# Patient Record
Sex: Male | Born: 1985 | Hispanic: Yes | Marital: Single | Smoking: Former smoker
Health system: Southern US, Community
[De-identification: ages and names within clinical notes are randomized; demographics above are authoritative.]

## PROBLEM LIST (undated history)

## (undated) DIAGNOSIS — M419 Scoliosis, unspecified: Secondary | ICD-10-CM

## (undated) DIAGNOSIS — M109 Gout, unspecified: Secondary | ICD-10-CM

## (undated) DIAGNOSIS — E669 Obesity, unspecified: Secondary | ICD-10-CM

## (undated) HISTORY — PX: NO PAST SURGERIES: SHX2092

---

## 2018-05-21 ENCOUNTER — Ambulatory Visit
Admission: EM | Admit: 2018-05-21 | Discharge: 2018-05-21 | Disposition: A | Discharge status: Home / Self Care | Payer: BLUE CROSS/BLUE SHIELD | Attending: Family Medicine | Admitting: Family Medicine

## 2018-05-21 ENCOUNTER — Other Ambulatory Visit: Payer: Self-pay

## 2018-05-21 ENCOUNTER — Ambulatory Visit (INDEPENDENT_AMBULATORY_CARE_PROVIDER_SITE_OTHER): Payer: BLUE CROSS/BLUE SHIELD

## 2018-05-21 DIAGNOSIS — M779 Enthesopathy, unspecified: Secondary | ICD-10-CM

## 2018-05-21 DIAGNOSIS — M7750 Other enthesopathy of unspecified foot: Secondary | ICD-10-CM

## 2018-05-21 NOTE — Discharge Instructions (Signed)
Rest, ice, over the counter anti-inflammatories °

## 2018-05-21 NOTE — ED Provider Notes (Signed)
[facility]    CSN: [id number] Arrival date & time: 05/21/18  1445     History   Chief Complaint Chief Complaint  Patient presents with  . Foot Pain    right    HPI Mason Russell is a 33 y.o. male.   33 yo male with a c/o right foot pain for the past 2 months but worse this week. Denies any specific injury, redness, swelling, rash.   The history is provided by the patient.  Foot Pain     History reviewed. No pertinent past medical history.  There are no active problems to display for this patient.   Past Surgical History:  Procedure Laterality Date  . NO PAST SURGERIES         Home Medications    Prior to Admission medications   Not on File    Family History Family History  Problem Relation Age of Onset  . Cancer Mother     Social History Social History   Tobacco Use  . Smoking status: Never Smoker  . Smokeless tobacco: Never Used  Substance Use Topics  . Alcohol use: Yes    Frequency: Never    Comment: occasionally  . Drug use: Not Currently     Allergies   Patient has no known allergies.   Review of Systems Review of Systems   Physical Exam Triage Vital Signs ED Triage Vitals  Enc Vitals Group     BP 05/21/18 1504 (!) 146/88     Pulse Rate 05/21/18 1504 100     Resp 05/21/18 1504 16     Temp 05/21/18 1504 98.8 F (37.1 C)     Temp Source 05/21/18 1504 Oral     SpO2 05/21/18 1504 100 %     Weight 05/21/18 1502 168 lb (76.2 kg)     Height 05/21/18 1502 5\' 5"  (1.651 m)     Head Circumference --      Peak Flow --      Pain Score 05/21/18 1502 7     Pain Loc --      Pain Edu? --      Excl. in GC? --    No data found.  Updated Vital Signs BP (!) 146/88 (BP Location: Left Arm)   Pulse 100   Temp 98.8 F (37.1 C) (Oral)   Resp 16   Ht 5\' 5"  (1.651 m)   Wt 76.2 kg   SpO2 100%   BMI 27.96 kg/m   Visual Acuity Right Eye Distance:   Left Eye Distance:   Bilateral Distance:    Right Eye Near:   Left  Eye Near:    Bilateral Near:     Physical Exam Vitals signs and nursing note reviewed.  Constitutional:      General: He is not in acute distress.    Appearance: He is not toxic-appearing or diaphoretic.  Musculoskeletal:     Right foot: Normal range of motion and normal capillary refill. Tenderness (diffuse dorsal mid foot tenderness; no erythema, rash or swellilng) and bony tenderness present. No swelling, crepitus, deformity or laceration.  Neurological:     Mental Status: He is alert.      UC Treatments / Results  Labs (all labs ordered are listed, but only abnormal results are displayed) Labs Reviewed - No data to display  EKG None  Radiology Dg Foot Complete Right  Result Date: 05/21/2018 CLINICAL DATA:  Right foot pain for several months. No known injury. EXAM: RIGHT FOOT  COMPLETE - 3+ VIEW COMPARISON:  None. FINDINGS: There is no evidence of fracture or dislocation. There is no evidence of arthropathy or other focal bone abnormality. Soft tissues are unremarkable. IMPRESSION: Negative. Electronically Signed   By: Lupita RaiderJames  Green Jr M.D.   On: 05/21/2018 15:45    Procedures Procedures (including critical care time)  Medications Ordered in UC Medications - No data to display  Initial Impression / Assessment and Plan / UC Course  I have reviewed the triage vital signs and the nursing notes.  Pertinent labs & imaging results that were available during my care of the patient were reviewed by me and considered in my medical decision making (see chart for details).      Final Clinical Impressions(s) / UC Diagnoses   Final diagnoses:  Tendonitis of foot     Discharge Instructions     Rest, ice, over the counter anti-inflammatories    ED Prescriptions    None      1. x-ray results and diagnosis reviewed with patient 2. Recommend supportive treatment as above   3. Follow-up prn if symptoms worsen or don't improve  Controlled Substance Prescriptions [place]  Controlled Substance Registry consulted? Not Applicable   Payton Mccallumonty, Demarrio Menges, MD 05/21/18 1816

## 2018-05-21 NOTE — ED Triage Notes (Signed)
Patient complains of right foot pain that started a couple months ago but seemed to worsen on Monday. Patient states that foot has been persistently hurting and he thought it was from over use.

## 2018-05-23 ENCOUNTER — Other Ambulatory Visit: Payer: Self-pay

## 2018-05-23 ENCOUNTER — Encounter: Payer: Self-pay | Admitting: Emergency Medicine

## 2018-05-23 ENCOUNTER — Ambulatory Visit
Admission: EM | Admit: 2018-05-23 | Discharge: 2018-05-23 | Disposition: A | Discharge status: Home / Self Care | Payer: Managed Care, Other (non HMO) | Attending: Family Medicine | Admitting: Family Medicine

## 2018-05-23 DIAGNOSIS — M7750 Other enthesopathy of unspecified foot: Secondary | ICD-10-CM

## 2018-05-23 MED ORDER — MELOXICAM 15 MG PO TABS: 15.0000 mg | ORAL_TABLET | Freq: Every day | ORAL | 0 refills | Status: DC | PRN

## 2018-05-23 NOTE — ED Provider Notes (Signed)
[facility]    CSN: [id number] Arrival date & time: 05/23/18  1628  History   Chief Complaint Chief Complaint  Patient presents with  . Ankle Pain    right     HPI  33 year old male presents with foot pain.  Patient seen 2 days ago.  Complained of 65-month history of right foot pain.  Worse with certain movements and activity.  No recent fall, trauma, injury.  Patient states that he continues to have pain.  His pain is located on the medial aspect of his ankle.  Patient states that he is using crutches to avoid putting weight on his foot.  Patient states that he is not having any pain at this time.  Patient states that his swelling is improving.  He is taking Advil as needed.  No other associated symptoms.  No other complaints.  History reviewed as below. No significant PMH.  Past Surgical History:  Procedure Laterality Date  . NO PAST SURGERIES     Home Medications    Prior to Admission medications   Medication Sig Start Date End Date Taking? Authorizing Provider  meloxicam (MOBIC) 15 MG tablet Take 1 tablet (15 mg total) by mouth daily as needed. 05/23/18   Tommie Sams, DO    Family History Family History  Problem Relation Age of Onset  . Cancer Mother     Social History Social History   Tobacco Use  . Smoking status: Never Smoker  . Smokeless tobacco: Never Used  Substance Use Topics  . Alcohol use: Yes    Frequency: Never    Comment: occasionally  . Drug use: Not Currently     Allergies   Patient has no known allergies.   Review of Systems Review of Systems  Constitutional: Negative.   Musculoskeletal:       Right foot pain.    Physical Exam Triage Vital Signs ED Triage Vitals  Enc Vitals Group     BP 05/23/18 1638 130/89     Pulse Rate 05/23/18 1638 88     Resp 05/23/18 1638 16     Temp 05/23/18 1638 98.4 F (36.9 C)     Temp Source 05/23/18 1638 Oral     SpO2 05/23/18 1638 100 %     Weight 05/23/18 1635 168 lb (76.2 kg)     Height 05/23/18 1635 5\' 5"  (1.651 m)     Head Circumference --      Peak Flow --      Pain Score 05/23/18 1635 8     Pain Loc --      Pain Edu? --      Excl. in GC? --    Updated Vital Signs BP 130/89 (BP Location: Left Arm)   Pulse 88   Temp 98.4 F (36.9 C) (Oral)   Resp 16   Ht 5\' 5"  (1.651 m)   Wt 76.2 kg   SpO2 100%   BMI 27.96 kg/m   Visual Acuity Right Eye Distance:   Left Eye Distance:   Bilateral Distance:    Right Eye Near:   Left Eye Near:    Bilateral Near:     Physical Exam Vitals signs and nursing note reviewed.  Constitutional:      General: He is not in acute distress.    Appearance: Normal appearance.  HENT:     Head: Normocephalic and atraumatic.  Eyes:     General:        Right eye: No discharge.  Left eye: No discharge.     Conjunctiva/sclera: Conjunctivae normal.  Pulmonary:     Effort: Pulmonary effort is normal. No respiratory distress.  Musculoskeletal:     Comments: Right foot -patient endorsing pain along the medial malleolus.  No discrete tenderness noted on exam.  No swelling.  Normal range of motion.  Neurological:     Mental Status: He is alert.  Psychiatric:        Mood and Affect: Mood normal.        Behavior: Behavior normal.    UC Treatments / Results  Labs (all labs ordered are listed, but only abnormal results are displayed) Labs Reviewed - No data to display  EKG None  Radiology No results found.  Procedures Procedures (including critical care time)  Medications Ordered in UC Medications - No data to display  Initial Impression / Assessment and Plan / UC Course  I have reviewed the triage vital signs and the nursing notes.  Pertinent labs & imaging results that were available during my care of the patient were reviewed by me and considered in my medical decision making (see chart for details).    33 year old male presents with right foot tendinitis.  Meloxicam as needed.  Advised to see podiatry if  he fails improve or worsens.  Final Clinical Impressions(s) / UC Diagnoses   Final diagnoses:  Tendonitis of foot     Discharge Instructions     Use the medication as directed.  Do not take advil or other NSAID's with the medication.  If persists, see podiatry.  Take care  Dr. Adriana Simasook    ED Prescriptions    Medication Sig Dispense Auth. Provider   meloxicam (MOBIC) 15 MG tablet Take 1 tablet (15 mg total) by mouth daily as needed. 30 tablet Tommie Samsook, Udell Blasingame G, DO     Controlled Substance Prescriptions [place] Controlled Substance Registry consulted? Not Applicable   Tommie SamsCook, Jodean Valade G, DO 05/23/18 1652

## 2018-05-23 NOTE — Discharge Instructions (Signed)
Use the medication as directed.  Do not take advil or other NSAID's with the medication.  If persists, see podiatry.  Take care  Dr. Adriana Simas

## 2018-05-23 NOTE — ED Triage Notes (Signed)
Patient c/o ongoing pain in his right ankle but states that swelling has improved.  Patient was seen here on Wed and had a Negative x-ray.

## 2018-06-19 ENCOUNTER — Other Ambulatory Visit: Payer: Self-pay | Admitting: Family Medicine

## 2018-07-09 ENCOUNTER — Other Ambulatory Visit: Payer: Self-pay

## 2018-07-09 ENCOUNTER — Ambulatory Visit (INDEPENDENT_AMBULATORY_CARE_PROVIDER_SITE_OTHER)
Admit: 2018-07-09 | Discharge: 2018-07-09 | Disposition: A | Discharge status: Home / Self Care | Payer: BC Managed Care – PPO | Attending: Emergency Medicine | Admitting: Emergency Medicine

## 2018-07-09 ENCOUNTER — Ambulatory Visit
Admission: EM | Admit: 2018-07-09 | Discharge: 2018-07-09 | Disposition: A | Discharge status: Home / Self Care | Payer: BC Managed Care – PPO | Attending: Emergency Medicine | Admitting: Emergency Medicine

## 2018-07-09 ENCOUNTER — Encounter: Payer: Self-pay | Admitting: Emergency Medicine

## 2018-07-09 DIAGNOSIS — M25562 Pain in left knee: Secondary | ICD-10-CM

## 2018-07-09 DIAGNOSIS — M25462 Effusion, left knee: Secondary | ICD-10-CM

## 2018-07-09 DIAGNOSIS — M7122 Synovial cyst of popliteal space [Baker], left knee: Secondary | ICD-10-CM

## 2018-07-09 MED ORDER — HYDROCODONE-ACETAMINOPHEN 5-325 MG PO TABS: 1.0000 | ORAL_TABLET | Freq: Four times a day (QID) | ORAL | 0 refills | Status: DC | PRN

## 2018-07-09 MED ORDER — KETOROLAC TROMETHAMINE 60 MG/2ML IM SOLN
30.0000 mg | Freq: Once | INTRAMUSCULAR | Status: AC
  Administered 2018-07-09: 30 mg via INTRAMUSCULAR

## 2018-07-09 MED ORDER — IBUPROFEN 600 MG PO TABS: 600.0000 mg | ORAL_TABLET | Freq: Four times a day (QID) | ORAL | 0 refills | Status: DC | PRN

## 2018-07-09 MED ORDER — ACETAMINOPHEN 500 MG PO TABS
1000.0000 mg | ORAL_TABLET | Freq: Once | ORAL | Status: AC
  Administered 2018-07-09: 1000 mg via ORAL

## 2018-07-09 NOTE — Discharge Instructions (Signed)
I suspect that this is a ruptured Baker's cyst.  However I have scheduled an ultrasound today to make sure that it is not a blood clot in your leg and to confirm that it is a Baker's cyst.  It is scheduled at 230 today.  Be here at 215.  Stay here until I talk to the tech or the radiologist so I can explain the results to you.  600 mg of ibuprofen combined with a Tylenol containing product 3 or 4 times a day.  This is a very effective combination for pain.  Take 600 mg of ibuprofen with 2 extra strength, or 1 g of Tylenol for mild to moderate pain, take the ibuprofen with 1-2 Norco for severe pain.  Do not take Tylenol and Norco as they both have Tylenol in them and too much Tylenol can hurt your liver.  Do not exceed 4 g of Tylenol from all sources in 1 day.  Ice your knee for 20 minutes at a time.  Follow-up with orthopedics if you are not getting better in 5 days, go to the ER for fevers above 100.4, if your knee turns red, hot, or for any other concerns.

## 2018-07-09 NOTE — ED Triage Notes (Signed)
Pt c/o left knee pain. Started about 2 days ago. Pt states that he had a cramp in his leg and he tried to walk it off but didn't help and it kept getting worse and started swelling and the pain radiates to his upper leg, lower back and lower leg. He is not able to stand on his leg.

## 2018-07-09 NOTE — ED Provider Notes (Signed)
HPI  SUBJECTIVE:  Mason Russell is a 33 y.o. male who presents with 2 days of achy, intermittent, left knee pain and swelling.  States it started as a "cramp" in the popliteal fossa, states that this pain got better but now his knee is swollen.  States that the pain lasts minutes to hours.  He denies kneeling, changes physical activity, trauma to his knee.  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  No fevers, body aches, joint erythema, increased temperature.  No lower extremity edema.  No hip or ankle pain.  Symptoms are worse with weightbearing and full extension, no alleviating factors.  He has not tried anything for this.  Past medical history negative for gout, Baker's cyst, DVT, history of left knee injury, diabetes.  PMD: None.  History reviewed. No pertinent past medical history.  Past Surgical History:  Procedure Laterality Date  . NO PAST SURGERIES      Family History  Problem Relation Age of Onset  . Cancer Mother     Social History   Tobacco Use  . Smoking status: Never Smoker  . Smokeless tobacco: Never Used  Substance Use Topics  . Alcohol use: Yes    Frequency: Never    Comment: occasionally  . Drug use: Not Currently    No current facility-administered medications for this encounter.   Current Outpatient Medications:  .  HYDROcodone-acetaminophen (NORCO/VICODIN) 5-325 MG tablet, Take 1-2 tablets by mouth every 6 (six) hours as needed for moderate pain or severe pain., Disp: 12 tablet, Rfl: 0 .  ibuprofen (ADVIL) 600 MG tablet, Take 1 tablet (600 mg total) by mouth every 6 (six) hours as needed., Disp: 30 tablet, Rfl: 0  No Known Allergies   ROS  As noted in HPI.   Physical Exam   Constitutional: Well developed, well nourished, no acute distress Eyes:  EOMI, conjunctiva normal bilaterally HENT: Normocephalic, atraumatic,mucus membranes moist Respiratory: Normal inspiratory effort Cardiovascular: Normal rate GI: nondistended skin: No rash,  skin intact Musculoskeletal: Left knee ROM decreased due to pain, Flexion  intact,  Patella NT,  Patellar tendon NT, Medial joint NT, Lateral joint NT, Popliteal region tender, Varus LCL stress testing stable, Valgus MCL stress testing stable but painful, McMurray's testing abnormal, Lachman's negative. Distal NVI with intact baseline sensation / motor / pulse distal to knee.  positive effusion. No erythema. No increased temperature. No crepitus.  Neurologic: Alert & oriented x 3, no focal neuro deficits Psychiatric: Speech and behavior appropriate   ED Course   Medications  acetaminophen (TYLENOL) tablet 1,000 mg (1,000 mg Oral Given 07/09/18 1030)  ketorolac (TORADOL) injection 30 mg (30 mg Intramuscular Given 07/09/18 1031)    Orders Placed This Encounter  Procedures  . US Venous Img Lower Unilateral Left    Slot ok per [place] Ar    Scheduling Instructions:     At 18 today    Order Specific Question:   Reason for Exam (SYMPTOM  OR DIAGNOSIS REQUIRED)    Answer:   r/o ruptured bakers cyst vs DVT    Order Specific Question:   Preferred imaging location?    Answer:   [facility]    Order Specific Question:   Call Results- Best Contact Number?    Answer:   [phone]    No results found for this or any previous visit (from the past 24 hour(s)). US Venous Img Lower Unilateral Left  Result Date: 07/09/2018 CLINICAL DATA:  Knee pain and edema EXAM: LEFT LOWER EXTREMITY VENOUS DOPPLER  ULTRASOUND TECHNIQUE: Gray-scale sonography with graded compression, as well as color Doppler and duplex ultrasound were performed to evaluate the lower extremity deep venous systems from the level of the common femoral vein and including the common femoral, femoral, profunda femoral, popliteal and calf veins including the posterior tibial, peroneal and gastrocnemius veins when visible. The superficial great saphenous vein was also interrogated. Spectral Doppler was utilized to evaluate flow at rest and with  distal augmentation maneuvers in the common femoral, femoral and popliteal veins. COMPARISON:  None. FINDINGS: Contralateral Common Femoral Vein: Respiratory phasicity is normal and symmetric with the symptomatic side. No evidence of thrombus. Normal compressibility. Common Femoral Vein: No evidence of thrombus. Normal compressibility, respiratory phasicity and response to augmentation. Saphenofemoral Junction: No evidence of thrombus. Normal compressibility and flow on color Doppler imaging. Profunda Femoral Vein: No evidence of thrombus. Normal compressibility and flow on color Doppler imaging. Femoral Vein: No evidence of thrombus. Normal compressibility, respiratory phasicity and response to augmentation. Popliteal Vein: No evidence of thrombus. Normal compressibility, respiratory phasicity and response to augmentation. Calf Veins: Not well visualized. Tibial and peroneal veins appear grossly patent. No gross thrombus. Superficial Great Saphenous Vein: No evidence of thrombus. Normal compressibility. Venous Reflux:  Not assessed Other Findings: popliteal fossa complex hypoechoic fluid collection measures up to 4 cm compatible with a Baker's cyst. There is also a large left knee joint effusion. IMPRESSION: Negative for significant left lower extremity DVT. Limited assessment of the calf veins. 4 cm popliteal fossa Baker's cyst and associated large left knee joint effusion. Electronically Signed   By: Judie PetitM.  Shick M.D.   On: 07/09/2018 15:07    ED Clinical Impression  1. Synovial cyst of left popliteal space   2. Acute pain of left knee   3. Effusion of left knee      ED Assessment/Plan  [place] Narcotic database reviewed for this patient, and feel that the risk/benefit ratio today is favorable for proceeding with a prescription for controlled substance.  No opiate prescriptions in 2 years.  suspect ruptured Baker's cyst.  Doubt gout, septic joint.  Scheduled DVT ultrasound to confirm Baker's cyst and rule  out DVT at 1430 today.  1 g of Tylenol with 30 mg of Toradol here.  Will send home with ibuprofen/Tylenol or ibuprofen/Norco 3-4 times a day as needed for pain. Ice the knee for 20 minutes at a time follow-up with orthopedics if not getting better in several days, to the ER if he gets worse.  Work note.  Return to work on Friday.  Ultrasound report reviewed.  4 cm popliteal fossa Baker's cyst and large left knee effusion.  No DVT.  See radiology report for details.  Called [phone] phone number on record.  Voicemail box has not been set up.  Will have staff continue to try and reach the patient.  Will advise him to see orthopedics ASAP.  Discussed imaging, MDM, treatment plan, and plan for follow-up with patient. Discussed sn/sx that should prompt return to the ED. patient agrees with plan.   Meds ordered this encounter  Medications  . acetaminophen (TYLENOL) tablet 1,000 mg  . ketorolac (TORADOL) injection 30 mg  . ibuprofen (ADVIL) 600 MG tablet    Sig: Take 1 tablet (600 mg total) by mouth every 6 (six) hours as needed.    Dispense:  30 tablet    Refill:  0  . HYDROcodone-acetaminophen (NORCO/VICODIN) 5-325 MG tablet    Sig: Take 1-2 tablets by mouth every 6 (six) hours  as needed for moderate pain or severe pain.    Dispense:  12 tablet    Refill:  0    *This clinic note was created using Scientist, clinical (histocompatibility and immunogenetics)Dragon dictation software. Therefore, there may be occasional mistakes despite careful proofreading.   ?   Domenick GongMortenson, Jary Louvier, MD 07/09/18 [id number]

## 2018-07-18 ENCOUNTER — Other Ambulatory Visit: Payer: Self-pay | Admitting: Orthopedic Surgery

## 2018-07-18 DIAGNOSIS — M25562 Pain in left knee: Secondary | ICD-10-CM

## 2018-08-04 ENCOUNTER — Ambulatory Visit
Admission: RE | Admit: 2018-08-04 | Discharge: 2018-08-04 | Disposition: A | Discharge status: Home / Self Care | Payer: BC Managed Care – PPO | Source: Ambulatory Visit | Attending: Orthopedic Surgery | Admitting: Orthopedic Surgery

## 2018-08-04 ENCOUNTER — Other Ambulatory Visit: Payer: Self-pay

## 2018-08-04 DIAGNOSIS — M25562 Pain in left knee: Secondary | ICD-10-CM | POA: Insufficient documentation

## 2018-08-20 ENCOUNTER — Encounter: Payer: Self-pay | Admitting: Emergency Medicine

## 2018-08-20 ENCOUNTER — Other Ambulatory Visit: Payer: Self-pay

## 2018-08-20 DIAGNOSIS — M25562 Pain in left knee: Secondary | ICD-10-CM | POA: Diagnosis present

## 2018-08-20 DIAGNOSIS — M109 Gout, unspecified: Secondary | ICD-10-CM | POA: Diagnosis not present

## 2018-08-20 DIAGNOSIS — Z79899 Other long term (current) drug therapy: Secondary | ICD-10-CM | POA: Insufficient documentation

## 2018-08-20 NOTE — ED Triage Notes (Signed)
Pt to triage via w/c, mask in place, with no distress noted; pt reports left knee pain & swelling x month; st recent MRI but awaiting results

## 2018-08-21 ENCOUNTER — Emergency Department
Admission: EM | Admit: 2018-08-21 | Discharge: 2018-08-21 | Disposition: A | Discharge status: Home / Self Care | Payer: BC Managed Care – PPO | Attending: Emergency Medicine | Admitting: Emergency Medicine

## 2018-08-21 DIAGNOSIS — M109 Gout, unspecified: Secondary | ICD-10-CM

## 2018-08-21 HISTORY — DX: Scoliosis, unspecified: M41.9

## 2018-08-21 LAB — SYNOVIAL CELL COUNT + DIFF, W/ CRYSTALS
Eosinophils-Synovial: 0 %
Lymphocytes-Synovial Fld: 18 %
Monocyte-Macrophage-Synovial Fluid: 67 %
Neutrophil, Synovial: 15 %
Other Cells-SYN: 0
WBC, Synovial: 623 /mm3 — ABNORMAL HIGH (ref 0–200)

## 2018-08-21 MED ORDER — LIDOCAINE-PRILOCAINE 2.5-2.5 % EX CREA
TOPICAL_CREAM | Freq: Once | CUTANEOUS | Status: AC
  Administered 2018-08-21: 04:00:00 via TOPICAL

## 2018-08-21 MED ORDER — COLCHICINE 0.6 MG PO TABS: 0.6000 mg | ORAL_TABLET | Freq: Two times a day (BID) | ORAL | 0 refills | Status: DC

## 2018-08-21 MED ORDER — COLCHICINE 0.6 MG PO TABS
1.2000 mg | ORAL_TABLET | Freq: Once | ORAL | Status: AC
  Administered 2018-08-21: 1.2 mg via ORAL
  Filled 2018-08-21 (×2): qty 2

## 2018-08-21 MED ORDER — IBUPROFEN 600 MG PO TABS
600.0000 mg | ORAL_TABLET | Freq: Once | ORAL | Status: AC
  Administered 2018-08-21: 600 mg via ORAL
  Filled 2018-08-21: qty 1

## 2018-08-21 MED ORDER — PREDNISONE 20 MG PO TABS: 40.0000 mg | ORAL_TABLET | Freq: Every day | ORAL | 0 refills | Status: AC

## 2018-08-21 MED ORDER — OXYCODONE-ACETAMINOPHEN 5-325 MG PO TABS
1.0000 | ORAL_TABLET | Freq: Once | ORAL | Status: AC
  Administered 2018-08-21: 1 via ORAL
  Filled 2018-08-21: qty 1

## 2018-08-21 MED ORDER — PREDNISONE 20 MG PO TABS
40.0000 mg | ORAL_TABLET | Freq: Once | ORAL | Status: AC
  Administered 2018-08-21: 40 mg via ORAL
  Filled 2018-08-21: qty 2

## 2018-08-21 MED ORDER — PREDNISONE 20 MG PO TABS: 40.0000 mg | ORAL_TABLET | Freq: Every day | ORAL | 0 refills | Status: DC

## 2018-08-21 NOTE — ED Provider Notes (Signed)
[facility] Emergency Department Provider Note  ____________________________________________  Time seen: Approximately 4:26 AM  I have reviewed the triage vital signs and the nursing notes.   HISTORY  Chief Complaint Knee Pain   HPI Mason Russell is a 33 y.o. male who presents for evaluation of left knee pain x6 weeks.  Patient presents today because the pain has become more severe.  His knee is more swollen.  Also feels very hot to the touch.  Patient has been seen at urgent care and by orthopedics for this.  He underwent an MRI which showed no meniscal or ligament injury.  Has had x-rays and ultrasounds with no significant abnormalities.  Patient denies any trauma.  He reports that he was unable to sleep this evening due to severe pain.  He ran out of narcotic pain medication that was given to him by the orthopedics 4 days ago.  Has been taking Tylenol and ibuprofen with no significant relief.  The pain is worse with movement or weightbearing.  No history of gout, DVT, IV drug use.   Past Medical History:  Diagnosis Date  . Scoliosis     Past Surgical History:  Procedure Laterality Date  . NO PAST SURGERIES      Prior to Admission medications   Medication Sig Start Date End Date Taking? Authorizing Provider  colchicine 0.6 MG tablet Take 1 tablet (0.6 mg total) by mouth 2 (two) times daily for 10 days. 08/21/18 08/31/18  Rudene Re, MD  HYDROcodone-acetaminophen (NORCO/VICODIN) 5-325 MG tablet Take 1-2 tablets by mouth every 6 (six) hours as needed for moderate pain or severe pain. 07/09/18   Melynda Ripple, MD  ibuprofen (ADVIL) 600 MG tablet Take 1 tablet (600 mg total) by mouth every 6 (six) hours as needed. 07/09/18   Melynda Ripple, MD  predniSONE (DELTASONE) 20 MG tablet Take 2 tablets (40 mg total) by mouth daily for 7 days. 08/21/18 08/28/18  Rudene Re, MD    Allergies Patient has no known allergies.  Family History  Problem  Relation Age of Onset  . Cancer Mother     Social History Social History   Tobacco Use  . Smoking status: Never Smoker  . Smokeless tobacco: Never Used  Substance Use Topics  . Alcohol use: Yes    Frequency: Never    Comment: occasionally  . Drug use: Not Currently    Review of Systems  Constitutional: Negative for fever. Eyes: Negative for visual changes. ENT: Negative for sore throat. Neck: No neck pain  Cardiovascular: Negative for chest pain. Respiratory: Negative for shortness of breath. Gastrointestinal: Negative for abdominal pain, vomiting or diarrhea. Genitourinary: Negative for dysuria. Musculoskeletal: Negative for back pain. + L knee pain Skin: Negative for rash. Neurological: Negative for headaches, weakness or numbness. Psych: No SI or HI  ____________________________________________   PHYSICAL EXAM:  VITAL SIGNS: ED Triage Vitals  Enc Vitals Group     BP 08/20/18 2336 (!) 136/101     Pulse Rate 08/20/18 2336 (!) 110     Resp 08/20/18 2336 18     Temp 08/20/18 2336 98.9 F (37.2 C)     Temp Source 08/20/18 2336 Oral     SpO2 08/20/18 2336 99 %     Weight 08/20/18 2337 165 lb (74.8 kg)     Height 08/20/18 2337 5\' 5"  (1.651 m)     Head Circumference --      Peak Flow --      Pain Score 08/21/18  [id number] 10     Pain Loc --      Pain Edu? --      Excl. in GC? --     Constitutional: Alert and oriented. Well appearing and in no apparent distress. HEENT:      Head: Normocephalic and atraumatic.         Eyes: Conjunctivae are normal. Sclera is non-icteric.       Mouth/Throat: Mucous membranes are moist.       Neck: Supple with no signs of meningismus. Cardiovascular: Regular rate and rhythm.  Respiratory: Normal respiratory effort.  Gastrointestinal: Soft, non tender, and non distended  Musculoskeletal: L knee is swollen, erythematous, and warm to the touch when compared to the right knee. Patient has limited ROM due to pain but able to passively  move the joint about 45 degrees.  Neurologic: Normal speech and language. Face is symmetric. Moving all extremities. No gross focal neurologic deficits are appreciated. Skin: Skin is warm, dry and intact. No rash noted. Psychiatric: Mood and affect are normal. Speech and behavior are normal.  ____________________________________________   LABS (all labs ordered are listed, but only abnormal results are displayed)  Labs Reviewed  SYNOVIAL CELL COUNT + DIFF, W/ CRYSTALS - Abnormal; Notable for the following components:      Result Value   Appearance-Synovial HAZY (*)    WBC, Synovial 623 (*)    All other components within normal limits  BODY FLUID CULTURE  GONOCOCCUS CULTURE  GRAM STAIN   ____________________________________________  EKG  none  ____________________________________________  RADIOLOGY  none  ____________________________________________   PROCEDURES  Procedure(s) performed:yes .Joint Aspiration/Arthrocentesis  Date/Time: 08/21/2018 4:29 AM Performed by: Nita SickleVeronese, [place], MD Authorized by: Nita SickleVeronese, [place], MD   Consent:    Consent obtained:  Verbal   Consent given by:  Patient   Risks discussed:  Bleeding, incomplete drainage, infection, nerve damage, pain and poor cosmetic result   Alternatives discussed:  No treatment Location:    Location:  Knee   Knee:  L knee Anesthesia (see MAR for exact dosages):    Anesthesia method:  Topical application   Topical anesthetic:  EMLA cream Procedure details:    Preparation: Patient was prepped and draped in usual sterile fashion     Needle gauge:  22 G   Ultrasound guidance: yes     Approach:  Superior   Aspirate amount:  5   Aspirate characteristics:  Yellow   Steroid injected: no     Specimen collected: yes   Post-procedure details:    Dressing:  Adhesive bandage   Patient tolerance of procedure:  Tolerated well, no immediate complications   Critical Care performed:  None  ____________________________________________   INITIAL IMPRESSION / ASSESSMENT AND PLAN / ED COURSE   33 y.o. male who presents for evaluation of atraumatic left knee pain x6 weeks.  Pain is swollen, tender to palpation, warm and erythematous with decreased range of motion.  Patient has undergone ultrasound, x-ray, and MRI with no significant findings other than joint effusion.  Discussed risks and benefits of arthrocentesis for fluid analysis with patient who agreed.  Fluid was sent for analysis and is pending.    _________________________ 6:33 AM on 08/21/2018 -----------------------------------------  Synovial fluid consistent with gout.  Will start patient on colchicine and prednisone.  Follow-up with PCP and my standard return precautions.    As part of my medical decision making, I reviewed the following data within the electronic MEDICAL RECORD NUMBER Nursing notes reviewed and incorporated, Labs  reviewed , Old chart reviewed, Notes from prior ED visits and [place] Controlled Substance Database   Patient was evaluated in Emergency Department today for the symptoms described in the history of present illness. Patient was evaluated in the context of the global COVID-19 pandemic, which necessitated consideration that the patient might be at risk for infection with the SARS-CoV-2 virus that causes COVID-19. Institutional protocols and algorithms that pertain to the evaluation of patients at risk for COVID-19 are in a state of rapid change based on information released by regulatory bodies including the CDC and federal and state organizations. These policies and algorithms were followed during the patient's care in the ED.   ____________________________________________   FINAL CLINICAL IMPRESSION(S) / ED DIAGNOSES   Final diagnoses:  Acute gout of left knee, unspecified cause      NEW MEDICATIONS STARTED DURING THIS VISIT:  ED Discharge Orders         Ordered    predniSONE (DELTASONE)  20 MG tablet  Daily,   Status:  Discontinued     08/21/18 0624    colchicine 0.6 MG tablet  2 times daily,   Status:  Discontinued     08/21/18 0624    colchicine 0.6 MG tablet  2 times daily     08/21/18 0630    predniSONE (DELTASONE) 20 MG tablet  Daily     08/21/18 0630           Note:  This document was prepared using Dragon voice recognition software and may include unintentional dictation errors.    Don PerkingVeronese, [place], MD 08/21/18 [phone]

## 2018-08-21 NOTE — ED Notes (Signed)
ED Provider at bedside. 

## 2018-08-29 LAB — BODY FLUID CULTURE: Culture: NO GROWTH

## 2018-10-29 LAB — GRAM STAIN

## 2018-11-12 ENCOUNTER — Ambulatory Visit
Admission: EM | Admit: 2018-11-12 | Discharge: 2018-11-12 | Disposition: A | Discharge status: Home / Self Care | Payer: BC Managed Care – PPO | Attending: Family Medicine | Admitting: Family Medicine

## 2018-11-12 ENCOUNTER — Other Ambulatory Visit: Payer: Self-pay

## 2018-11-12 ENCOUNTER — Encounter: Payer: Self-pay | Admitting: Emergency Medicine

## 2018-11-12 ENCOUNTER — Ambulatory Visit
Admission: EM | Admit: 2018-11-12 | Discharge: 2018-11-12 | Discharge status: Against Medical Advice | Payer: BC Managed Care – PPO

## 2018-11-12 DIAGNOSIS — M25562 Pain in left knee: Secondary | ICD-10-CM

## 2018-11-12 HISTORY — DX: Gout, unspecified: M10.9

## 2018-11-12 MED ORDER — MELOXICAM 15 MG PO TABS: 15.0000 mg | ORAL_TABLET | Freq: Every day | ORAL | 0 refills | Status: DC | PRN

## 2018-11-12 NOTE — ED Triage Notes (Signed)
Patient c/o left knee pain that started in July. He was diagnosed with gout in his knee. He states he is here to be evaluated if he can go back to work.

## 2018-11-12 NOTE — Discharge Instructions (Signed)
Take medication as prescribed. Use brace as needed. Ice.   Please follow-up with your orthopedist as soon as possible.  Call today.  This is important.  Establish local primary care for continued management.  Return to Urgent care for new or worsening concerns.

## 2018-11-12 NOTE — ED Provider Notes (Signed)
[facility] ____________________________________________  Time seen: Approximately 11:01 AM  I have reviewed the triage vital signs and the nursing notes.   HISTORY  Chief Complaint Knee Pain  HPI Mason Russell is a 33 y.o. male presenting for evaluation of left knee pain.  Reports left knee pain has been ongoing issue since July of this year.  Has been seen multiple times including in urgent care, ER and by orthopedist.  Presenting at this time for reevaluation as he still has been having left knee pain.  Says left knee pain currently is a milder pain.  States no increased swelling, does have occasional swelling.  Denies any recent fall, injury or trauma.  States no acute worsening from his baseline of knee pain.  States occasionally feels like his knee is going to give out while he is active but reports this has been ongoing since previous to his MRI.  Patient had a noted small Baker's cyst on ultrasound as well as MRI, no ligament or meniscal injury and was diagnosed with gout.  No fevers.  Reports able to still fully straighten and bend his knee at this time but sometimes has a issue with full straightening.  Has taken occasional Tylenol without resolution.  Previously on prednisone and colchicine, out of prescription medications now.  Has continued to remain ambulatory and active. Requesting a work note to limit activities at work going forward.  NO PCP  Ortho: Signa Kell     Past Medical History:  Diagnosis Date  . Gout   . Scoliosis     There are no active problems to display for this patient.   Past Surgical History:  Procedure Laterality Date  . NO PAST SURGERIES       No current facility-administered medications for this encounter.   Current Outpatient Medications:  .  meloxicam (MOBIC) 15 MG tablet, Take 1 tablet (15 mg total) by mouth daily as needed., Disp: 14 tablet, Rfl: 0  Allergies Patient has no known allergies.  Family History  Problem  Relation Age of Onset  . Cancer Mother     Social History Social History   Tobacco Use  . Smoking status: Never Smoker  . Smokeless tobacco: Never Used  Substance Use Topics  . Alcohol use: Yes    Frequency: Never    Comment: occasionally  . Drug use: Not Currently    Review of Systems Constitutional: No fever. Cardiovascular: Denies chest pain. Respiratory: Denies shortness of breath. Gastrointestinal: No abdominal pain.  Musculoskeletal: Positive left knee pain.  Skin: Negative for rash. Neurological: Negative for focal weakness or numbness.    ____________________________________________   PHYSICAL EXAM:  VITAL SIGNS: ED Triage Vitals  Enc Vitals Group     BP 11/12/18 1040 135/90     Pulse Rate 11/12/18 1040 87     Resp 11/12/18 1040 18     Temp 11/12/18 1040 98.2 F (36.8 C)     Temp Source 11/12/18 1040 Oral     SpO2 11/12/18 1040 97 %     Weight --      Height 11/12/18 1041 5\' 5"  (1.651 m)     Head Circumference --      Peak Flow --      Pain Score 11/12/18 1040 5     Pain Loc --      Pain Edu? --      Excl. in GC? --     Constitutional: Alert and oriented. Well appearing and in no acute distress. Eyes:  Conjunctivae are normal.  ENT      Head: Normocephalic and atraumatic. Cardiovascular: Good peripheral circulation. Respiratory: Normal respiratory effort without tachypnea nor retractions.  Musculoskeletal:  Bilateral pedal pulses equal and easily palpated. Except: Left anterior inferior patellar mild tenderness to direct palpation, no clear edema, no palpable effusion, no point bony tenderness, no erythema, able to fully extend as well as flex to 90 degrees actively, mild pain with anterior and posterior drawer test, no pain with medial lateral stress.  Left lower extremity otherwise nontender.  Ambulatory with steady gait. Neurologic:  Normal speech and language. No gross focal neurologic deficits are appreciated. Speech is normal. No gait  instability.  Skin:  Skin is warm, dry and intact. No rash noted. Psychiatric: Mood and affect are normal. Speech and behavior are normal. Patient exhibits appropriate insight and judgment   ___________________________________________   LABS (all labs ordered are listed, but only abnormal results are displayed)  Labs Reviewed - No data to display ____________________________________________  RADIOLOGY EXAM: MRI OF THE LEFT KNEE WITHOUT CONTRAST  TECHNIQUE: Multiplanar, multisequence MR imaging of the knee was performed. No intravenous contrast was administered.  COMPARISON:  None.  FINDINGS: MENISCI  Medial meniscus:  Intact.  Lateral meniscus:  Intact.  LIGAMENTS  Cruciates:  Intact.  Collaterals:  Intact.  CARTILAGE  Patellofemoral:  Preserved.  Medial:  Preserved.  Lateral:  Preserved.  Joint:  Moderate joint effusion  Popliteal Fossa:  Very small Baker's cyst.  Extensor Mechanism:  Intact.  Bones:  Normal marrow signal throughout.  Other: None.  IMPRESSION: Negative for meniscal or ligament tear.  Moderate joint effusion of indeterminate etiology.   Electronically Signed   By: Inge Rise M.D.   On: 08/05/2018 13:59  EXAM: LEFT LOWER EXTREMITY VENOUS DOPPLER ULTRASOUND  TECHNIQUE: Gray-scale sonography with graded compression, as well as color Doppler and duplex ultrasound were performed to evaluate the lower extremity deep venous systems from the level of the common femoral vein and including the common femoral, femoral, profunda femoral, popliteal and calf veins including the posterior tibial, peroneal and gastrocnemius veins when visible. The superficial great saphenous vein was also interrogated. Spectral Doppler was utilized to evaluate flow at rest and with distal augmentation maneuvers in the common femoral, femoral and popliteal veins.  COMPARISON:  None.  FINDINGS: Contralateral Common Femoral  Vein: Respiratory phasicity is normal and symmetric with the symptomatic side. No evidence of thrombus. Normal compressibility.  Common Femoral Vein: No evidence of thrombus. Normal compressibility, respiratory phasicity and response to augmentation.  Saphenofemoral Junction: No evidence of thrombus. Normal compressibility and flow on color Doppler imaging.  Profunda Femoral Vein: No evidence of thrombus. Normal compressibility and flow on color Doppler imaging.  Femoral Vein: No evidence of thrombus. Normal compressibility, respiratory phasicity and response to augmentation.  Popliteal Vein: No evidence of thrombus. Normal compressibility, respiratory phasicity and response to augmentation.  Calf Veins: Not well visualized. Tibial and peroneal veins appear grossly patent. No gross thrombus.  Superficial Great Saphenous Vein: No evidence of thrombus. Normal compressibility.  Venous Reflux:  Not assessed  Other Findings: popliteal fossa complex hypoechoic fluid collection measures up to 4 cm compatible with a Baker's cyst. There is also a large left knee joint effusion.  IMPRESSION: Negative for significant left lower extremity DVT. Limited assessment of the calf veins.  4 cm popliteal fossa Baker's cyst and associated large left knee joint effusion.   Electronically Signed   By: Jerilynn Mages.  Shick M.D.   On: 07/09/2018 15:07  PROCEDURES Procedures    INITIAL IMPRESSION / ASSESSMENT AND PLAN / ED COURSE  Pertinent labs & imaging results that were available during my care of the patient were reviewed by me and considered in my medical decision making (see chart for details).  Well-appearing patient.  Presenting here for continued left knee pain.  Patient has had continued left knee pain from at least July of this year.  Has been seen multiple times including with orthopedics.  Above results from previous left knee MRI and left lower extremity ultrasound, noting  that patient did have a small Baker's cyst.  Other ER visit was noted to be pertinent for left leg gout.  No recent injury or trauma.  States pain has been ongoing without acute worsening.  Patient reports that he was previously treated with prednisone and colchicine which did help some but no resolution.  Does not currently have any prescription medications.  States he is called orthopedist and has not received return phone call.  New PCP.  Requesting work note for today.  Patient states that he wants to return back to work tomorrow cautioned to be careful and urged prompt follow-up with orthopedic as soon as possible, call today.  Rx Mobic. Discussed indication, risks and benefits of medications with patient.  Encouraged to establish PCP.  Discussed follow up and return parameters including no resolution or any worsening concerns. Patient verbalized understanding and agreed to plan.   ____________________________________________   FINAL CLINICAL IMPRESSION(S) / ED DIAGNOSES  Final diagnoses:  Acute pain of left knee     ED Discharge Orders         Ordered    meloxicam (MOBIC) 15 MG tablet  Daily PRN     11/12/18 1103           Note: This dictation was prepared with Dragon dictation along with smaller phrase technology. Any transcriptional errors that result from this process are unintentional.         Renford DillsMiller, Ithan [place], NP 11/12/18 1152

## 2020-07-01 ENCOUNTER — Encounter: Payer: Self-pay | Admitting: Emergency Medicine

## 2020-07-01 ENCOUNTER — Ambulatory Visit (INDEPENDENT_AMBULATORY_CARE_PROVIDER_SITE_OTHER): Payer: Self-pay

## 2020-07-01 ENCOUNTER — Ambulatory Visit
Admission: EM | Admit: 2020-07-01 | Discharge: 2020-07-01 | Disposition: A | Discharge status: Home / Self Care | Payer: Self-pay

## 2020-07-01 ENCOUNTER — Other Ambulatory Visit: Payer: Self-pay

## 2020-07-01 DIAGNOSIS — Z8739 Personal history of other diseases of the musculoskeletal system and connective tissue: Secondary | ICD-10-CM

## 2020-07-01 DIAGNOSIS — M25562 Pain in left knee: Secondary | ICD-10-CM

## 2020-07-01 DIAGNOSIS — M545 Low back pain, unspecified: Secondary | ICD-10-CM

## 2020-07-01 DIAGNOSIS — M25551 Pain in right hip: Secondary | ICD-10-CM

## 2020-07-01 DIAGNOSIS — G8929 Other chronic pain: Secondary | ICD-10-CM

## 2020-07-01 MED ORDER — PREDNISONE 10 MG PO TABS: ORAL_TABLET | ORAL | 0 refills | Status: DC

## 2020-07-01 MED ORDER — KETOROLAC TROMETHAMINE 60 MG/2ML IM SOLN
60.0000 mg | Freq: Once | INTRAMUSCULAR | Status: AC
  Administered 2020-07-01: 60 mg via INTRAMUSCULAR

## 2020-07-01 MED ORDER — CYCLOBENZAPRINE HCL 10 MG PO TABS: 10.0000 mg | ORAL_TABLET | Freq: Three times a day (TID) | ORAL | 0 refills | Status: AC | PRN

## 2020-07-01 MED ORDER — HYDROCODONE-ACETAMINOPHEN 5-325 MG PO TABS: 2.0000 | ORAL_TABLET | Freq: Four times a day (QID) | ORAL | 0 refills | Status: AC | PRN

## 2020-07-01 NOTE — ED Provider Notes (Signed)
[facility]    CSN: [id number] Arrival date & time: 07/01/20  1405      History   Chief Complaint Chief Complaint  Patient presents with   Back Pain   Hip Pain    right    HPI Mason Russell is a 35 y.o. male presenting with multiple complaints.  Patient says that over the last few months he has had left-sided knee pain off and on with associated swelling.  Patient denies any injury but does report history of gout in this knee.  He has occasionally taken NSAIDs and try to elevate the knee with some slight improvement in the swelling but the pain has seemed to be persistent.  Patient says that he has been favoring his right side due to his left-sided knee pain and over the past 2 to 3 days he has developed right-sided hip pain and lower back pain.  Patient admits to increased pain with any movement of the hip.  He says the pain does radiate somewhat into his right groin region and right anterior thigh.  He denies any leg weakness, numbness or tingling.  No urinary complaints.  Patient denies any testicular pain or swelling or groin swelling.  Patient says he has not had any problems with his hip before.  He does have history of scoliosis.  Patient says his hip pain is currently 8 out of 10 and is causing him the most pain.  He has no other complaints.  HPI  Past Medical History:  Diagnosis Date   Gout    Scoliosis     There are no problems to display for this patient.   Past Surgical History:  Procedure Laterality Date   NO PAST SURGERIES         Home Medications    Prior to Admission medications   Medication Sig Start Date End Date Taking? Authorizing Provider  cyclobenzaprine (FLEXERIL) 10 MG tablet Take 1 tablet (10 mg total) by mouth 3 (three) times daily as needed for up to 10 days for muscle spasms. 07/01/20 07/11/20 Yes Shirlee Latch, PA-C  HYDROcodone-acetaminophen (NORCO/VICODIN) 5-325 MG tablet Take 2 tablets by mouth every 6 (six) hours as needed  for up to 5 days for severe pain. 07/01/20 07/06/20 Yes Shirlee Latch, PA-C  predniSONE (DELTASONE) 10 MG tablet Take 6 tablets on the first day and decrease by 1 tablet daily until complete 07/01/20  Yes Eusebio Friendly B, PA-C  acetaminophen (TYLENOL) 500 MG tablet Take by mouth.    [provider]  colchicine 0.6 MG tablet Take 1 tablet (0.6 mg total) by mouth 2 (two) times daily for 10 days. 08/21/18 11/12/18  Nita Sickle, MD    Family History Family History  Problem Relation Age of Onset   Cancer Mother     Social History Social History   Tobacco Use   Smoking status: Never   Smokeless tobacco: Never  Vaping Use   Vaping Use: Never used  Substance Use Topics   Alcohol use: Yes    Comment: occasionally   Drug use: Not Currently     Allergies   Patient has no known allergies.   Review of Systems Review of Systems  Constitutional:  Negative for fatigue and fever.  Genitourinary:  Negative for dysuria, flank pain, scrotal swelling and testicular pain.  Musculoskeletal:  Positive for arthralgias, back pain, gait problem and joint swelling. Negative for neck pain.  Skin:  Negative for rash and wound.  Neurological:  Negative for  weakness and numbness.    Physical Exam Triage Vital Signs ED Triage Vitals  Enc Vitals Group     BP 07/01/20 1443 (!) 130/103     Pulse Rate 07/01/20 1443 (!) 108     Resp 07/01/20 1443 16     Temp 07/01/20 1443 98.4 F (36.9 C)     Temp Source 07/01/20 1443 Oral     SpO2 07/01/20 1443 98 %     Weight 07/01/20 1439 164 lb 14.5 oz (74.8 kg)     Height 07/01/20 1439 5\' 5"  (1.651 m)     Head Circumference --      Peak Flow --      Pain Score 07/01/20 1439 8     Pain Loc --      Pain Edu? --      Excl. in GC? --    No data found.  Updated Vital Signs BP (!) 130/103 (BP Location: Left Arm)   Pulse (!) 108   Temp 98.4 F (36.9 C) (Oral)   Resp 16   Ht 5\' 5"  (1.651 m)   Wt 164 lb 14.5 oz (74.8 kg)   SpO2 98%   BMI  27.44 kg/m       Physical Exam Vitals and nursing note reviewed.  Constitutional:      General: He is not in acute distress.    Appearance: Normal appearance. He is well-developed. He is not ill-appearing.  HENT:     Head: Normocephalic and atraumatic.  Eyes:     General: No scleral icterus.    Conjunctiva/sclera: Conjunctivae normal.  Cardiovascular:     Rate and Rhythm: Regular rhythm. Tachycardia present.     Heart sounds: Normal heart sounds.  Pulmonary:     Effort: Pulmonary effort is normal. No respiratory distress.     Breath sounds: Normal breath sounds.  Abdominal:     Palpations: Abdomen is soft.     Tenderness: There is no abdominal tenderness.  Musculoskeletal:     Cervical back: Neck supple.     Lumbar back: Tenderness (TTP bilateral paralumbar muscles (R>L)) present. Decreased range of motion. Positive right straight leg raise test. Negative left straight leg raise test.     Right hip: Bony tenderness (TTP along greater trocanter and ASIS) present. Decreased range of motion.     Left knee: Swelling (moderate swelling anterior knee) present. No erythema, ecchymosis or bony tenderness. Normal range of motion. Tenderness present over the lateral joint line.     Comments: Patient is guarding throughout the hip exam and has significant pain with any movement of the hip (internal and external rotation especially). Increased pain in hip with SLR on right  Skin:    General: Skin is warm and dry.  Neurological:     General: No focal deficit present.     Mental Status: He is alert. Mental status is at baseline.     Motor: No weakness.     Gait: Gait abnormal.  Psychiatric:        Mood and Affect: Mood normal.        Behavior: Behavior normal.        Thought Content: Thought content normal.     UC Treatments / Results  Labs (all labs ordered are listed, but only abnormal results are displayed) Labs Reviewed - No data to display  EKG   Radiology DG Lumbar Spine  Complete  Result Date: 07/01/2020 CLINICAL DATA:  Right hip and low back pain for 3 days.  No known injury. EXAM: LUMBAR SPINE - COMPLETE 4+ VIEW COMPARISON:  Report only from outside lumbar radiographs 11/27/2018. FINDINGS: There are 5 lumbar type vertebral bodies. The alignment is normal aside from mild straightening. The lumbar disc spaces are preserved. No evidence of fracture or pars defect. IMPRESSION: No acute lumbar spine findings or significant spondylosis. Electronically Signed   By: Carey Bullocks M.D.   On: 07/01/2020 16:23   DG Knee Complete 4 Views Left  Result Date: 07/01/2020 CLINICAL DATA:  Intermittent knee pain for months. EXAM: LEFT KNEE - COMPLETE 4+ VIEW COMPARISON:  Report only from prior radiographs 11/27/2018. MRI 08/04/2018. FINDINGS: The mineralization and alignment are normal. There is no evidence of acute fracture or dislocation. The joint spaces are preserved. Persistent moderate size knee joint effusion. IMPRESSION: No acute osseous findings. Persistent moderate-sized joint effusion, similar to previous MRI. Electronically Signed   By: Carey Bullocks M.D.   On: 07/01/2020 16:20   DG Hip Unilat W or Wo Pelvis 2-3 Views Right  Result Date: 07/01/2020 CLINICAL DATA:  Right hip and back pain for 3 days. No known injury. EXAM: DG HIP (WITH OR WITHOUT PELVIS) 2-3V RIGHT COMPARISON:  None. FINDINGS: The bones appear adequately mineralized. No evidence of acute fracture, dislocation or femoral head avascular necrosis. The hip and sacroiliac joint spaces are preserved. Scattered probable pelvic phleboliths. IMPRESSION: No acute osseous findings or significant arthropathic changes. Electronically Signed   By: Carey Bullocks M.D.   On: 07/01/2020 16:22    Procedures Procedures (including critical care time)  Medications Ordered in UC Medications  ketorolac (TORADOL) injection 60 mg (60 mg Intramuscular Given 07/01/20 1535)    Initial Impression / Assessment and Plan / UC  Course  I have reviewed the triage vital signs and the nursing notes.  Pertinent labs & imaging results that were available during my care of the patient were reviewed by me and considered in my medical decision making (see chart for details).  35 year old male presenting with multiple complaints.  First he states that he has had left-sided knee pain off and on for the past few months.  He says he has had issues with this knee for the past couple of years.  Has had a gout flareup in the knee and has had to have fluid removed off of the joint by Ortho.  Patient has tried OTC pain medications without much improvement in symptoms.  He says he does not have any brace anymore since his brother currently has says. Advised that he may need to follow-up with Ortho especially for his knee if the swelling continues even after he ices it and uses a knee brace has been provided him today.  Additionally, he is presenting for right hip pain and lower back pain.  Denies any injury with this.  Patient does state that since his knee was bothering him he started to favor his right lower extremity.  Suspect some of his right hip pain may be due to strain/sprain or could even be due to sciatic cause especially since he has increased pain and straight leg raise and has lower back pain.  Supportive care encouraged.  He has been given 60 mg IM ketorolac in clinic.  I have sent prednisone to pharmacy as well as cyclobenzaprine and Norco.  Reviewed controlled substance database.  Find patient to be low risk for abuse.    Patient did have x-ray of his right hip, left knee, and L-spine.  X-rays show no significant findings.  He does have an effusion of the left knee though.  Advised patient on treating his symptoms as above.  Advised him to follow-up with Ortho as well if needed if he is not improving.  ED precautions especially for back and hip pain reviewed patient. Work note given.   Final Clinical Impressions(s) / UC  Diagnoses   Final diagnoses:  Right hip pain  Acute bilateral low back pain without sciatica  Chronic pain of left knee  History of gout     Discharge Instructions      X-rays do not show any joint dislocations, fractures or signs of arthritis. You do have fluid in your knee like you did before and may need to see ortho to have this drained if not improving with rest, ice, elevation and compression.   Your hip pain may be due to a strain.  I have sent in prednisone which should help all of your conditions.  Have also sent a muscle relaxer (do not take cyclobenzaprine or norco while working or if driving).  Additionally, I have sent in something stronger for pain if you absolutely need it.  Your condition should all be improving over the next 1 to 2 weeks but if they are not or if they acutely worsen you should either go to the emergency department or orthopedics.  See more information by orthopedics below.  You have a condition requiring you to follow up with Orthopedics so please call one of the following office for appointment:   [facility] [address], [place], [place] [address] Phone: [phone]  [facility] [address], [place], [place] [address] Phone: [phone]      ED Prescriptions     Medication Sig Dispense Auth. Provider   predniSONE (DELTASONE) 10 MG tablet Take 6 tablets on the first day and decrease by 1 tablet daily until complete 21 tablet Eusebio FriendlyEaves, Zira Helinski B, PA-C   cyclobenzaprine (FLEXERIL) 10 MG tablet Take 1 tablet (10 mg total) by mouth 3 (three) times daily as needed for up to 10 days for muscle spasms. 20 tablet Shirlee LatchEaves, Elin Seats B, PA-C   HYDROcodone-acetaminophen (NORCO/VICODIN) 5-325 MG tablet Take 2 tablets by mouth every 6 (six) hours as needed for up to 5 days for severe pain. 10 tablet Shirlee LatchEaves, Devarius Nelles B, PA-C      I have reviewed the PDMP during this encounter.   Shirlee Latchaves, Jyla Hopf B, PA-C 07/01/20 1722

## 2020-07-01 NOTE — Discharge Instructions (Addendum)
X-rays do not show any joint dislocations, fractures or signs of arthritis. You do have fluid in your knee like you did before and may need to see ortho to have this drained if not improving with rest, ice, elevation and compression.   Your hip pain may be due to a strain.  I have sent in prednisone which should help all of your conditions.  Have also sent a muscle relaxer (do not take cyclobenzaprine or norco while working or if driving).  Additionally, I have sent in something stronger for pain if you absolutely need it.  Your condition should all be improving over the next 1 to 2 weeks but if they are not or if they acutely worsen you should either go to the emergency department or orthopedics.  See more information by orthopedics below.  You have a condition requiring you to follow up with Orthopedics so please call one of the following office for appointment:   [facility] [address] [place], [place] [address] Phone: [phone]  [facility] [address], [place], [place] [address] Phone: [phone]

## 2020-07-01 NOTE — ED Triage Notes (Signed)
Patient c/o right hip pain and back pain that started on Tuesday.  Patient denies recent fall or injury.  Patient reports pain in his left knee off and on for months.

## 2020-12-18 IMAGING — MR MRI OF THE LEFT KNEE WITHOUT CONTRAST
7 series · 40 of 40 positions shown · non-contrast
Comparison: None.

CLINICAL DATA: Left knee pain and swelling for 3 weeks. No known
injury.

EXAM:
MRI OF THE LEFT KNEE WITHOUT CONTRAST
TECHNIQUE: Multiplanar, multisequence MR imaging of the knee was performed. No
intravenous contrast was administered.

[Series 8: T2 fat-sat · axial · left · 4.0mm · 0.50mm/px · z∈[-90,+35]mm · 6 of 26 slices shown (1 of 3)]
[im 1/26]
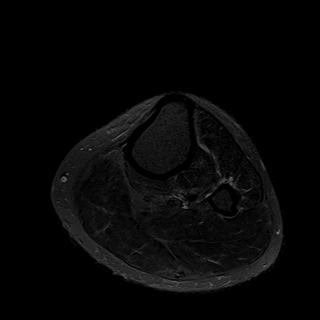
[im 6/26]
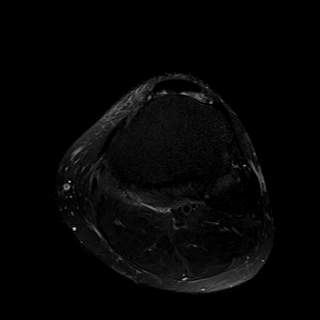
[im 11/26]
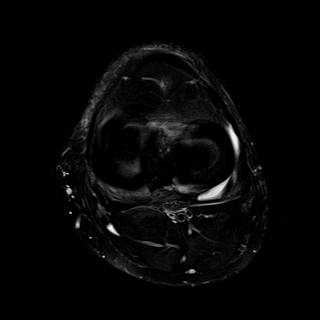
[im 16/26]
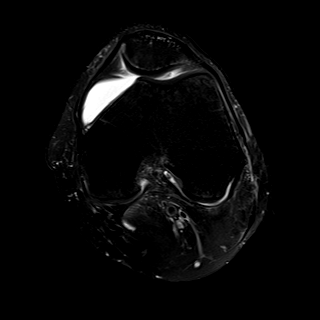
[im 21/26]
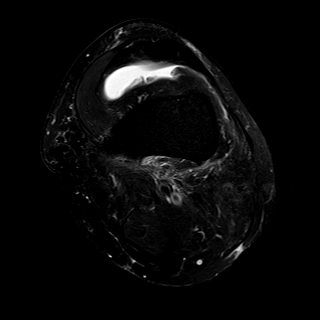
[im 26/26]
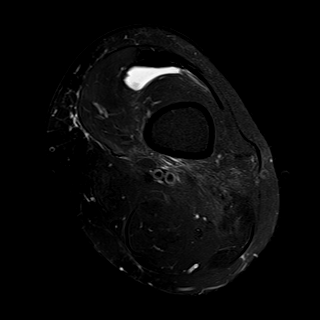

[Series 9: T1 · coronal · left · 4.0mm · 0.50mm/px · 6 of 28 slices shown]
[im 1/28]
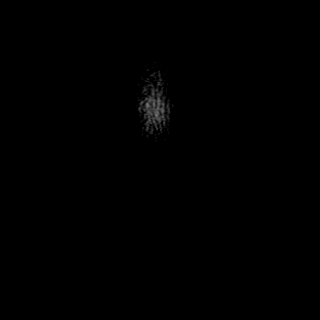
[im 6/28]
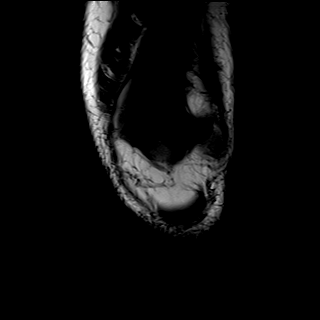
[im 11/28]
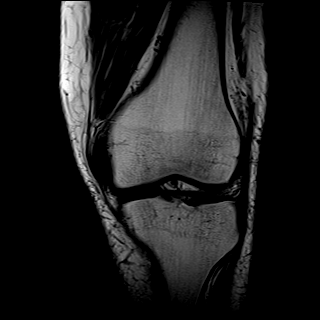
[im 17/28]
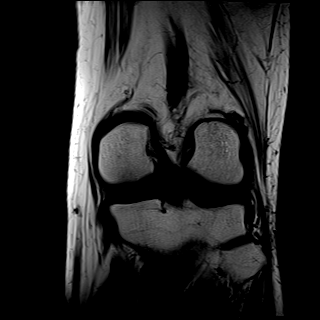
[im 22/28]
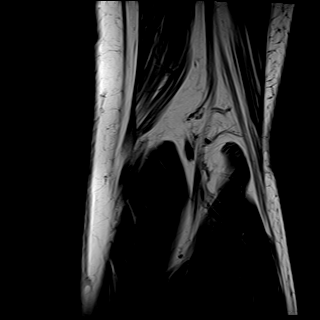
[im 28/28]
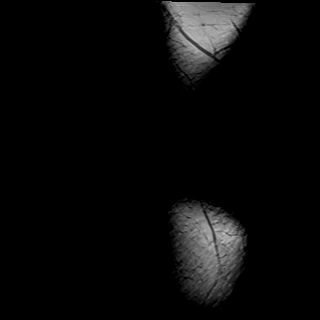

[Series 10: T2 fat-sat · coronal · left · 4.0mm · 0.59mm/px · 6 of 28 slices shown (2 of 3)]
[im 1/28]
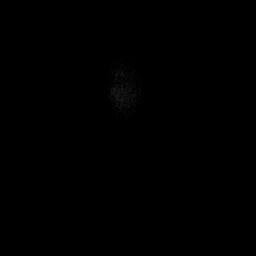
[im 6/28]
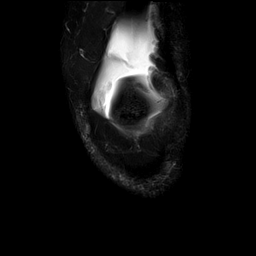
[im 11/28]
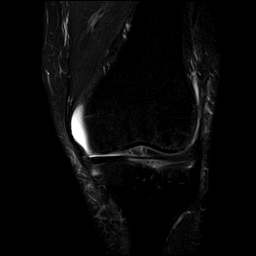
[im 17/28]
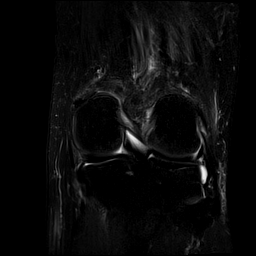
[im 22/28]
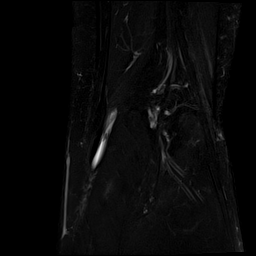
[im 28/28]
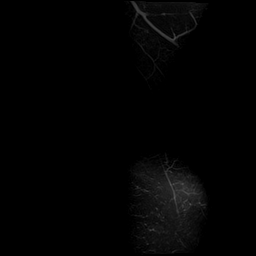

[Series 11: PD fat-sat · coronal · left · 4.0mm · 0.59mm/px · 6 of 28 slices shown (1 of 2)]
[im 1/28]
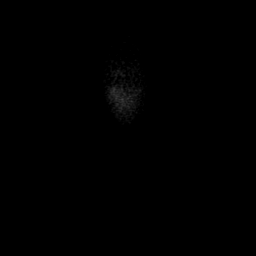
[im 6/28]
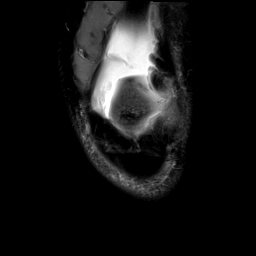
[im 11/28]
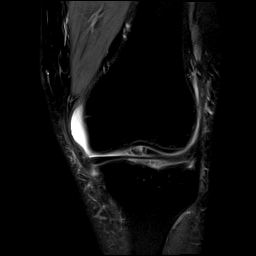
[im 17/28]
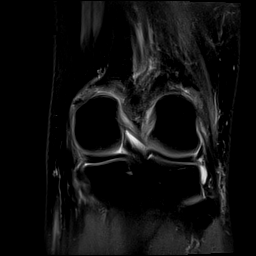
[im 22/28]
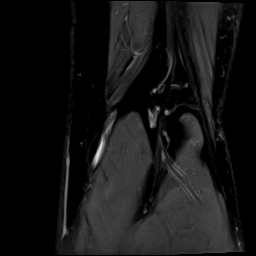
[im 28/28]
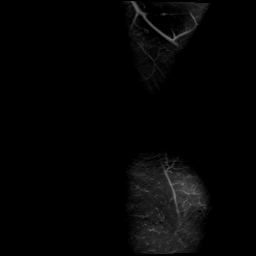

[Series 12: PD fat-sat · sagittal · left · 3.0mm · 0.59mm/px · 6 of 28 slices shown (2 of 2)]
[im 1/28]
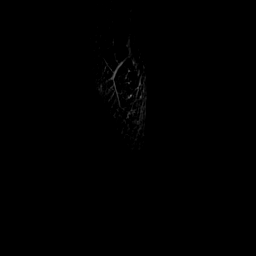
[im 6/28]
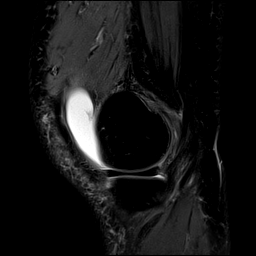
[im 11/28]
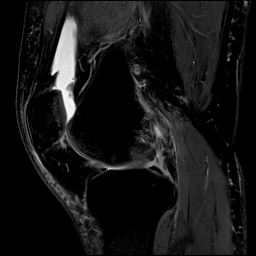
[im 17/28]
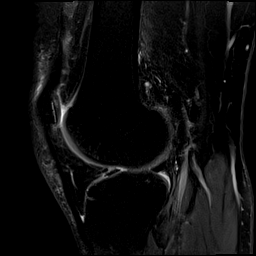
[im 22/28]
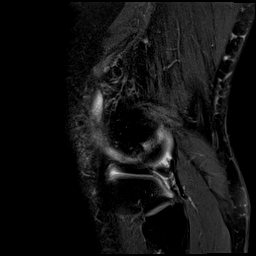
[im 28/28]
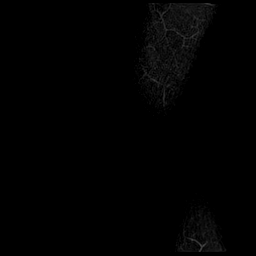

[Series 13: T2 fat-sat · sagittal · left · 3.0mm · 0.59mm/px · 7 of 30 slices shown (3 of 3)]
[im 1/30]
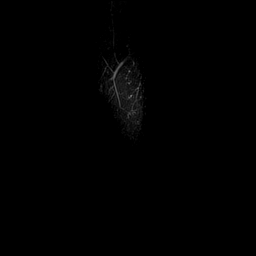
[im 5/30]
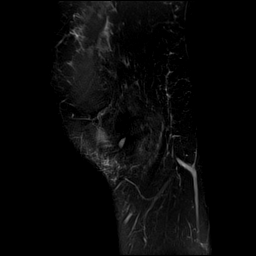
[im 10/30]
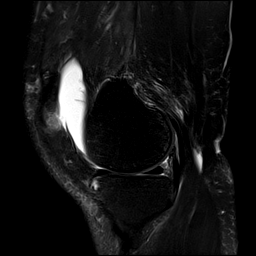
[im 15/30]
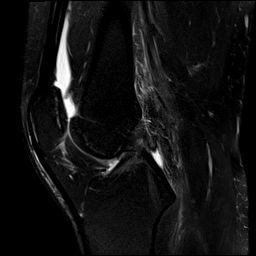
[im 20/30]
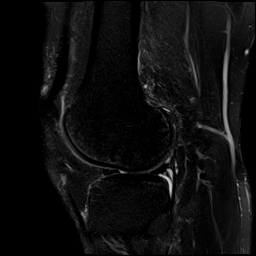
[im 25/30]
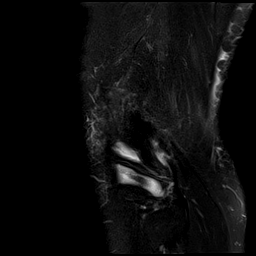
[im 30/30]
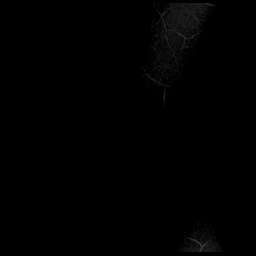

[Series 14: PD · coronal · left · 2.0mm · 0.47mm/px · 3 of 16 slices shown]
[im 1/16]
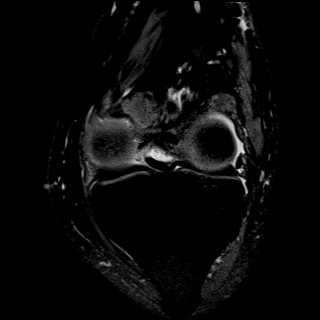
[im 8/16]
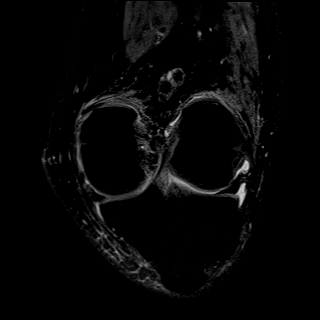
[im 16/16]
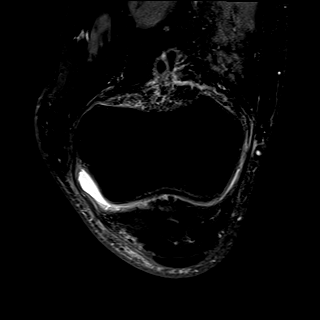

[40 of 40 positions shown; findings below may reference images not displayed]

FINDINGS: MENISCI

Medial meniscus:  Intact.

Lateral meniscus:  Intact.

LIGAMENTS

Cruciates:  Intact.

Collaterals:  Intact.

CARTILAGE

Patellofemoral:  Preserved.

Medial:  Preserved.

Lateral:  Preserved.

Joint:  Moderate joint effusion

Popliteal Fossa:  Very small Baker's cyst.

Extensor Mechanism:  Intact.

Bones:  Normal marrow signal throughout.

Other: None.
IMPRESSION: Negative for meniscal or ligament tear.

Moderate joint effusion of indeterminate etiology.

## 2021-05-13 ENCOUNTER — Emergency Department
Admission: EM | Admit: 2021-05-13 | Discharge: 2021-05-13 | Disposition: A | Discharge status: Home / Self Care | Payer: Self-pay | Attending: Student in an Organized Health Care Education/Training Program | Admitting: Student in an Organized Health Care Education/Training Program

## 2021-05-13 ENCOUNTER — Other Ambulatory Visit: Payer: Self-pay

## 2021-05-13 ENCOUNTER — Emergency Department: Payer: Self-pay

## 2021-05-13 ENCOUNTER — Encounter: Payer: Self-pay | Admitting: Emergency Medicine

## 2021-05-13 DIAGNOSIS — M79671 Pain in right foot: Secondary | ICD-10-CM | POA: Insufficient documentation

## 2021-05-13 DIAGNOSIS — M79672 Pain in left foot: Secondary | ICD-10-CM | POA: Insufficient documentation

## 2021-05-13 DIAGNOSIS — M25551 Pain in right hip: Secondary | ICD-10-CM | POA: Insufficient documentation

## 2021-05-13 LAB — CBC WITH DIFFERENTIAL/PLATELET
Abs Immature Granulocytes: 0.02 10*3/uL (ref 0.00–0.07)
Basophils Absolute: 0 10*3/uL (ref 0.0–0.1)
Basophils Relative: 1 %
Eosinophils Absolute: 0.5 10*3/uL (ref 0.0–0.5)
Eosinophils Relative: 7 %
HCT: 41.2 % (ref 39.0–52.0)
Hemoglobin: 13.5 g/dL (ref 13.0–17.0)
Immature Granulocytes: 0 %
Lymphocytes Relative: 33 %
Lymphs Abs: 2.3 10*3/uL (ref 0.7–4.0)
MCH: 29.9 pg (ref 26.0–34.0)
MCHC: 32.8 g/dL (ref 30.0–36.0)
MCV: 91.4 fL (ref 80.0–100.0)
Monocytes Absolute: 0.7 10*3/uL (ref 0.1–1.0)
Monocytes Relative: 9 %
Neutro Abs: 3.4 10*3/uL (ref 1.7–7.7)
Neutrophils Relative %: 50 %
Platelets: 268 10*3/uL (ref 150–400)
RBC: 4.51 MIL/uL (ref 4.22–5.81)
RDW: 13.7 % (ref 11.5–15.5)
WBC: 7 10*3/uL (ref 4.0–10.5)
nRBC: 0 % (ref 0.0–0.2)

## 2021-05-13 LAB — COMPREHENSIVE METABOLIC PANEL
ALT: 104 U/L — ABNORMAL HIGH (ref 0–44)
AST: 102 U/L — ABNORMAL HIGH (ref 15–41)
Albumin: 4 g/dL (ref 3.5–5.0)
Alkaline Phosphatase: 71 U/L (ref 38–126)
Anion gap: 11 (ref 5–15)
BUN: 8 mg/dL (ref 6–20)
CO2: 22 mmol/L (ref 22–32)
Calcium: 8.9 mg/dL (ref 8.9–10.3)
Chloride: 108 mmol/L (ref 98–111)
Creatinine, Ser: 0.66 mg/dL (ref 0.61–1.24)
GFR, Estimated: >60 mL/min (ref >60)
Glucose, Bld: 107 mg/dL — ABNORMAL HIGH (ref 70–99)
Potassium: 3.4 mmol/L — ABNORMAL LOW (ref 3.5–5.1)
Sodium: 141 mmol/L (ref 135–145)
Total Bilirubin: 0.7 mg/dL (ref 0.3–1.2)
Total Protein: 7.8 g/dL (ref 6.5–8.1)

## 2021-05-13 LAB — URIC ACID: Uric Acid, Serum: 13.4 mg/dL — ABNORMAL HIGH (ref 3.7–8.6)

## 2021-05-13 MED ORDER — CYCLOBENZAPRINE HCL 5 MG PO TABS: 5.0000 mg | ORAL_TABLET | Freq: Three times a day (TID) | ORAL | 0 refills | Status: DC | PRN

## 2021-05-13 NOTE — ED Triage Notes (Signed)
Pt via POV from home. Pt c/o bilateral foot and R hip pain for the past year. States he has some swelling to his feet. Pt is A&Ox4 and NAD.  ?

## 2021-05-13 NOTE — ED Notes (Addendum)
Pt states that he has to leave for family reasons. Provider notified and gave discharge instructions. Unable to obtain updated vitals before patient departure.  ?

## 2021-05-13 NOTE — Discharge Instructions (Addendum)
You have elevated uric acid (gout) and elevated liver function tests (LFTs). You should follow-up with one of the local community clinics as discussed. We did not complete the liver ultrasound as planned, since you had to leave. Take the prescription muscle relaxant as needed. Return to the ED as needed.  ?Avoid taking OTC Tylenol (acetaminophen) as it can increase abnormal liver function tests. ?

## 2021-05-13 NOTE — ED Provider Notes (Signed)
? ? ?[facility] ?Emergency Department Provider Note ? ? ? ? Event Date/Time  ? First MD Initiated Contact with Patient 05/13/21 1818   ?  (approximate) ? ? ?History  ? ?Foot Pain ? ? ?HPI ? ?Mason Russell is a 36 y.o. male with a history of gout and scoliosis, presents to the ED with complaints of chronic bilateral foot and right hip pain.  Patient reports symptoms intermittently over the last year.  He has had some swelling to his feet as of late.  He denies any fevers, chills, sweats, chest pain, shortness of breath.  Also denies any recent injury, trauma, or fall. ?  ? ? ?Physical Exam  ? ?Triage Vital Signs: ?ED Triage Vitals  ?Enc Vitals Group  ?   BP 05/13/21 1811 123/84  ?   Pulse Rate 05/13/21 1811 (!) 103  ?   Resp 05/13/21 1811 18  ?   Temp 05/13/21 1811 98.6 ?F (37 ?C)  ?   Temp Source 05/13/21 1811 Oral  ?   SpO2 05/13/21 1811 93 %  ?   Weight 05/13/21 1755 160 lb (72.6 kg)  ?   Height 05/13/21 1755 5\' 5"  (1.651 m)  ?   Head Circumference --   ?   Peak Flow --   ?   Pain Score 05/13/21 1755 5  ?   Pain Loc --   ?   Pain Edu? --   ?   Excl. in GC? --   ? ? ?Most recent vital signs: ?Vitals:  ? 05/13/21 1811  ?BP: 123/84  ?Pulse: (!) 103  ?Resp: 18  ?Temp: 98.6 ?F (37 ?C)  ?SpO2: 93%  ? ? ?General Awake, no distress.  ?CV:  Good peripheral perfusion.  ?RESP:  Normal effort.  ?ABD:  No distention.  ?MSK:  Bilateral feet without obvious deformity, dislocation, or effusion.  Patient with normal active range of motion of the ankle distally.  No calf or Achilles tenderness is elicited. ? ? ?ED Results / Procedures / Treatments  ? ?Labs ?(all labs ordered are listed, but only abnormal results are displayed) ?Labs Reviewed  ?COMPREHENSIVE METABOLIC PANEL - Abnormal; Notable for the following components:  ?    Result Value  ? Potassium 3.4 (*)   ? Glucose, Bld 107 (*)   ? AST 102 (*)   ? ALT 104 (*)   ? All other components within normal limits  ?URIC ACID - Abnormal; Notable for the  following components:  ? Uric Acid, Serum 13.4 (*)   ? All other components within normal limits  ?CBC WITH DIFFERENTIAL/PLATELET  ? ? ? ?EKG ? ? ?RADIOLOGY ? ? ? ?No results found. ? ? ?PROCEDURES: ? ?Critical Care performed: No ? ?Procedures ? ? ?MEDICATIONS ORDERED IN ED: ?Medications - No data to display ? ? ?IMPRESSION / MDM / ASSESSMENT AND PLAN / ED COURSE  ?I reviewed the triage vital signs and the nursing notes. ?             ?               ? ?Differential diagnosis includes, but is not limited to peripheral edema, gout, heel spurs, electrolyte abnormality ? ?Patient to the ED with evaluation of chronic intermittent foot swelling bilaterally.  Patient is evaluated for his complaints in the ED and found to have a reassuring work-up overall.  Patient with a history of gout and evidence of abnormal LFTs, presents for evaluation.  Patient is  found to have elevated uric acid at 13.4 and LFTs that are again elevated.  We discussed and plan to do an ultrasound of the patient's right upper quad, but he declined ultrasound when they presented to the room.  Patient's diagnosis is consistent with bilateral foot pain. Patient will be discharged home with prescriptions for Flexeril. Patient is made aware of the need to follow-up his LFTs. Patient is to follow up with his primary provider or local community clinic as needed or otherwise directed. Patient is given ED precautions to return to the ED for any worsening or new symptoms. ? ? ?FINAL CLINICAL IMPRESSION(S) / ED DIAGNOSES  ? ?Final diagnoses:  ?Foot pain, bilateral  ? ? ? ?Rx / DC Orders  ? ?ED Discharge Orders   ? ?      Ordered  ?  cyclobenzaprine (FLEXERIL) 5 MG tablet  3 times daily PRN       ? 05/13/21 2210  ? ?  ?  ? ?  ? ? ? ?Note:  This document was prepared using Dragon voice recognition software and may include unintentional dictation errors. ? ?  ?Lissa Hoard, PA-C ?05/16/21 1543 ? ?  ?Willy Eddy, MD ?05/24/21 [phone] ? ?

## 2021-05-16 ENCOUNTER — Emergency Department
Admission: EM | Admit: 2021-05-16 | Discharge: 2021-05-16 | Disposition: A | Discharge status: Home / Self Care | Payer: Self-pay | Attending: Emergency Medicine | Admitting: Emergency Medicine

## 2021-05-16 ENCOUNTER — Other Ambulatory Visit: Payer: Self-pay

## 2021-05-16 DIAGNOSIS — M722 Plantar fascial fibromatosis: Secondary | ICD-10-CM | POA: Insufficient documentation

## 2021-05-16 DIAGNOSIS — M1000 Idiopathic gout, unspecified site: Secondary | ICD-10-CM | POA: Insufficient documentation

## 2021-05-16 MED ORDER — MELOXICAM 15 MG PO TABS: 15.0000 mg | ORAL_TABLET | Freq: Every day | ORAL | 2 refills | Status: DC

## 2021-05-16 MED ORDER — COLCHICINE 0.6 MG PO TABS: 0.6000 mg | ORAL_TABLET | Freq: Two times a day (BID) | ORAL | 2 refills | Status: DC

## 2021-05-16 NOTE — ED Provider Notes (Signed)
? ?[facility] ?Provider Note ? ? ? Event Date/Time  ? First MD Initiated Contact with Patient 05/16/21 1108   ?  (approximate) ? ? ?History  ? ?Foot Injury ? ? ?HPI ? ?Mason Russell is a 36 y.o. male with history of scoliosis, gout, and elevated liver enzymes presents emergency department complaining of pain on the bottom of his foot.  Patient was seen here on 05/13/2021 and left prior to completing the work-up.  Patient did have lab work done.  Patient states he has been taking the Flexeril for his muscle pain.  States feels like it hurts to walk on the bottom of his foot.  Is worse in the mornings.  Worse after being on it for long period of time.  Denies any numbness or tingling.  No known injury. ? ?  ? ? ?Physical Exam  ? ?Triage Vital Signs: ?ED Triage Vitals  ?Enc Vitals Group  ?   BP 05/16/21 1058 (!) 123/96  ?   Pulse Rate 05/16/21 1058 89  ?   Resp 05/16/21 1058 18  ?   Temp 05/16/21 1058 98 ?F (36.7 ?C)  ?   Temp src --   ?   SpO2 05/16/21 1058 97 %  ?   Weight --   ?   Height --   ?   Head Circumference --   ?   Peak Flow --   ?   Pain Score 05/16/21 1032 8  ?   Pain Loc --   ?   Pain Edu? --   ?   Excl. in GC? --   ? ? ?Most recent vital signs: ?Vitals:  ? 05/16/21 1058  ?BP: (!) 123/96  ?Pulse: 89  ?Resp: 18  ?Temp: 98 ?F (36.7 ?C)  ?SpO2: 97%  ? ? ? ?General: Awake, no distress.   ?CV:  Good peripheral perfusion. regular rate and  rhythm ?Resp:  Normal effort.  ?Abd:  No distention.   ?Other:  Right foot is tender along the plantar surface, plantar tendon is intact and he does have full range of motion although does reproduce pain.  Neurovascular is intact ? ? ?ED Results / Procedures / Treatments  ? ?Labs ?(all labs ordered are listed, but only abnormal results are displayed) ?Labs Reviewed - No data to display ? ? ?EKG ? ? ? ? ?RADIOLOGY ? ? ? ? ?PROCEDURES: ? ? ?Procedures ? ? ?MEDICATIONS ORDERED IN ED: ?Medications - No data to display ? ? ?IMPRESSION / MDM / ASSESSMENT AND  PLAN / ED COURSE  ?I reviewed the triage vital signs and the nursing notes. ?             ?               ? ?Differential diagnosis includes, but is not limited to, sprain, plantar fasciitis, fracture ? ?Without the patient having any injury I do not feel he has a fracture.  Physical exam is consistent with plantar fasciitis.  I did explain these findings to him.  He needs to follow-up with his PCP for his elevated liver enzymes which may be due to his alcohol intake.  Explained to him that this does cause gout and his uric acid was elevated on his last visit on 05/13/2021.  He is in agreement to follow-up with his primary care about these findings.  Explained to him that the foot is Planter fasciitis and he would need to apply ice and wear good shoes  with support.  Try not to bear weight if having a lot of difficulty and follow-up with podiatry.  Patient is in agreement treatment plan.  He was discharged stable condition. ? ? ? ? ?  ? ? ?FINAL CLINICAL IMPRESSION(S) / ED DIAGNOSES  ? ?Final diagnoses:  ?Plantar fasciitis  ?Idiopathic gout, unspecified chronicity, unspecified site  ? ? ? ?Rx / DC Orders  ? ?ED Discharge Orders   ? ?      Ordered  ?  meloxicam (MOBIC) 15 MG tablet  Daily       ? 05/16/21 1121  ?  colchicine 0.6 MG tablet  2 times daily       ? 05/16/21 1121  ? ?  ?  ? ?  ? ? ? ?Note:  This document was prepared using Dragon voice recognition software and may include unintentional dictation errors. ? ?  ?Faythe Ghee, PA-C ?05/16/21 1232 ? ?  ?Jene Every, MD ?05/16/21 1245 ? ?

## 2021-05-16 NOTE — ED Triage Notes (Signed)
Pt comes with c/o foot pain. ?

## 2021-06-24 ENCOUNTER — Encounter: Payer: Self-pay | Admitting: Radiology

## 2021-06-24 ENCOUNTER — Inpatient Hospital Stay: Payer: Self-pay

## 2021-06-24 ENCOUNTER — Other Ambulatory Visit: Payer: Self-pay

## 2021-06-24 ENCOUNTER — Emergency Department: Payer: Self-pay

## 2021-06-24 ENCOUNTER — Inpatient Hospital Stay
Admission: EM | Admit: 2021-06-24 | Discharge: 2021-06-30 | DRG: 554 | Disposition: A | Discharge status: Home Health | Payer: Self-pay | Attending: Internal Medicine | Admitting: Internal Medicine

## 2021-06-24 DIAGNOSIS — Z79899 Other long term (current) drug therapy: Secondary | ICD-10-CM

## 2021-06-24 DIAGNOSIS — M533 Sacrococcygeal disorders, not elsewhere classified: Secondary | ICD-10-CM | POA: Diagnosis present

## 2021-06-24 DIAGNOSIS — R7982 Elevated C-reactive protein (CRP): Secondary | ICD-10-CM | POA: Diagnosis present

## 2021-06-24 DIAGNOSIS — M48061 Spinal stenosis, lumbar region without neurogenic claudication: Secondary | ICD-10-CM | POA: Diagnosis present

## 2021-06-24 DIAGNOSIS — A419 Sepsis, unspecified organism: Secondary | ICD-10-CM | POA: Diagnosis present

## 2021-06-24 DIAGNOSIS — E669 Obesity, unspecified: Secondary | ICD-10-CM | POA: Diagnosis present

## 2021-06-24 DIAGNOSIS — R531 Weakness: Secondary | ICD-10-CM | POA: Diagnosis present

## 2021-06-24 DIAGNOSIS — M4658 Other infective spondylopathies, sacral and sacrococcygeal region: Secondary | ICD-10-CM | POA: Diagnosis present

## 2021-06-24 DIAGNOSIS — M5416 Radiculopathy, lumbar region: Secondary | ICD-10-CM | POA: Diagnosis present

## 2021-06-24 DIAGNOSIS — M7989 Other specified soft tissue disorders: Secondary | ICD-10-CM

## 2021-06-24 DIAGNOSIS — M79602 Pain in left arm: Secondary | ICD-10-CM

## 2021-06-24 DIAGNOSIS — M064 Inflammatory polyarthropathy: Principal | ICD-10-CM | POA: Diagnosis present

## 2021-06-24 DIAGNOSIS — M25551 Pain in right hip: Secondary | ICD-10-CM | POA: Diagnosis present

## 2021-06-24 DIAGNOSIS — M1990 Unspecified osteoarthritis, unspecified site: Secondary | ICD-10-CM

## 2021-06-24 DIAGNOSIS — M419 Scoliosis, unspecified: Secondary | ICD-10-CM | POA: Diagnosis present

## 2021-06-24 DIAGNOSIS — M879 Osteonecrosis, unspecified: Secondary | ICD-10-CM | POA: Diagnosis present

## 2021-06-24 DIAGNOSIS — Z87891 Personal history of nicotine dependence: Secondary | ICD-10-CM

## 2021-06-24 DIAGNOSIS — R609 Edema, unspecified: Secondary | ICD-10-CM | POA: Diagnosis present

## 2021-06-24 DIAGNOSIS — Z791 Long term (current) use of non-steroidal anti-inflammatories (NSAID): Secondary | ICD-10-CM

## 2021-06-24 DIAGNOSIS — Z20822 Contact with and (suspected) exposure to covid-19: Secondary | ICD-10-CM | POA: Diagnosis present

## 2021-06-24 DIAGNOSIS — Z7952 Long term (current) use of systemic steroids: Secondary | ICD-10-CM

## 2021-06-24 DIAGNOSIS — R509 Fever, unspecified: Secondary | ICD-10-CM

## 2021-06-24 DIAGNOSIS — F1290 Cannabis use, unspecified, uncomplicated: Secondary | ICD-10-CM | POA: Diagnosis present

## 2021-06-24 DIAGNOSIS — M25422 Effusion, left elbow: Secondary | ICD-10-CM | POA: Diagnosis present

## 2021-06-24 DIAGNOSIS — Z6830 Body mass index (BMI) 30.0-30.9, adult: Secondary | ICD-10-CM

## 2021-06-24 DIAGNOSIS — M109 Gout, unspecified: Secondary | ICD-10-CM | POA: Diagnosis present

## 2021-06-24 DIAGNOSIS — E876 Hypokalemia: Secondary | ICD-10-CM | POA: Diagnosis present

## 2021-06-24 DIAGNOSIS — Z8739 Personal history of other diseases of the musculoskeletal system and connective tissue: Secondary | ICD-10-CM

## 2021-06-24 DIAGNOSIS — M25512 Pain in left shoulder: Secondary | ICD-10-CM | POA: Diagnosis present

## 2021-06-24 DIAGNOSIS — M545 Low back pain, unspecified: Principal | ICD-10-CM

## 2021-06-24 HISTORY — DX: Obesity, unspecified: E66.9

## 2021-06-24 LAB — COMPREHENSIVE METABOLIC PANEL
ALT: 34 U/L (ref 0–44)
AST: 31 U/L (ref 15–41)
Albumin: 3.9 g/dL (ref 3.5–5.0)
Alkaline Phosphatase: 69 U/L (ref 38–126)
Anion gap: 11 (ref 5–15)
BUN: 13 mg/dL (ref 6–20)
CO2: 24 mmol/L (ref 22–32)
Calcium: 9.6 mg/dL (ref 8.9–10.3)
Chloride: 106 mmol/L (ref 98–111)
Creatinine, Ser: 0.68 mg/dL (ref 0.61–1.24)
GFR, Estimated: >60 mL/min (ref >60)
Glucose, Bld: 145 mg/dL — ABNORMAL HIGH (ref 70–99)
Potassium: 3.3 mmol/L — ABNORMAL LOW (ref 3.5–5.1)
Sodium: 141 mmol/L (ref 135–145)
Total Bilirubin: 1.3 mg/dL — ABNORMAL HIGH (ref 0.3–1.2)
Total Protein: 8.9 g/dL — ABNORMAL HIGH (ref 6.5–8.1)

## 2021-06-24 LAB — CBC WITH DIFFERENTIAL/PLATELET
Abs Immature Granulocytes: 0.05 10*3/uL (ref 0.00–0.07)
Basophils Absolute: 0 10*3/uL (ref 0.0–0.1)
Basophils Relative: 0 %
Eosinophils Absolute: 0.1 10*3/uL (ref 0.0–0.5)
Eosinophils Relative: 1 %
HCT: 43.4 % (ref 39.0–52.0)
Hemoglobin: 13.9 g/dL (ref 13.0–17.0)
Immature Granulocytes: 1 %
Lymphocytes Relative: 14 %
Lymphs Abs: 1.5 10*3/uL (ref 0.7–4.0)
MCH: 29.8 pg (ref 26.0–34.0)
MCHC: 32 g/dL (ref 30.0–36.0)
MCV: 92.9 fL (ref 80.0–100.0)
Monocytes Absolute: 1.2 10*3/uL — ABNORMAL HIGH (ref 0.1–1.0)
Monocytes Relative: 11 %
Neutro Abs: 8 10*3/uL — ABNORMAL HIGH (ref 1.7–7.7)
Neutrophils Relative %: 73 %
Platelets: 446 10*3/uL — ABNORMAL HIGH (ref 150–400)
RBC: 4.67 MIL/uL (ref 4.22–5.81)
RDW: 13.2 % (ref 11.5–15.5)
WBC: 10.8 10*3/uL — ABNORMAL HIGH (ref 4.0–10.5)
nRBC: 0 % (ref 0.0–0.2)

## 2021-06-24 LAB — URINALYSIS, ROUTINE W REFLEX MICROSCOPIC
Bilirubin Urine: NEGATIVE
Glucose, UA: NEGATIVE mg/dL
Ketones, ur: 5 mg/dL — AB
Leukocytes,Ua: NEGATIVE
Nitrite: NEGATIVE
Protein, ur: NEGATIVE mg/dL
Specific Gravity, Urine: 1.026 (ref 1.005–1.030)
pH: 5 (ref 5.0–8.0)

## 2021-06-24 LAB — URINE DRUG SCREEN, QUALITATIVE (ARMC ONLY)
Amphetamines, Ur Screen: NOT DETECTED
Barbiturates, Ur Screen: NOT DETECTED
Benzodiazepine, Ur Scrn: NOT DETECTED
Cannabinoid 50 Ng, Ur ~~LOC~~: NOT DETECTED
Cocaine Metabolite,Ur ~~LOC~~: NOT DETECTED
MDMA (Ecstasy)Ur Screen: NOT DETECTED
Methadone Scn, Ur: NOT DETECTED
Opiate, Ur Screen: NOT DETECTED
Phencyclidine (PCP) Ur S: NOT DETECTED
Tricyclic, Ur Screen: POSITIVE — AB

## 2021-06-24 LAB — HIV ANTIBODY (ROUTINE TESTING W REFLEX): HIV Screen 4th Generation wRfx: NONREACTIVE

## 2021-06-24 LAB — PROTIME-INR
INR: 1.1 (ref 0.8–1.2)
Prothrombin Time: 14.5 seconds (ref 11.4–15.2)

## 2021-06-24 LAB — MAGNESIUM: Magnesium: 2.3 mg/dL (ref 1.7–2.4)

## 2021-06-24 LAB — PROCALCITONIN: Procalcitonin: <0.1 ng/mL

## 2021-06-24 LAB — C-REACTIVE PROTEIN: CRP: 19.6 mg/dL — ABNORMAL HIGH (ref <1.0)

## 2021-06-24 LAB — APTT: aPTT: 33 seconds (ref 24–36)

## 2021-06-24 LAB — SARS CORONAVIRUS 2 BY RT PCR: SARS Coronavirus 2 by RT PCR: NEGATIVE

## 2021-06-24 LAB — LACTIC ACID, PLASMA
Lactic Acid, Venous: 1.7 mmol/L (ref 0.5–1.9)
Lactic Acid, Venous: 1 mmol/L (ref 0.5–1.9)

## 2021-06-24 LAB — URIC ACID: Uric Acid, Serum: 8.9 mg/dL — ABNORMAL HIGH (ref 3.7–8.6)

## 2021-06-24 LAB — SEDIMENTATION RATE: Sed Rate: 83 mm/hr — ABNORMAL HIGH (ref 0–15)

## 2021-06-24 MED ORDER — METHOCARBAMOL 500 MG PO TABS
500.0000 mg | ORAL_TABLET | Freq: Three times a day (TID) | ORAL | Status: DC | PRN
  Administered 2021-06-26 – 2021-06-29 (×6): 500 mg via ORAL
  Filled 2021-06-24 (×8): qty 1

## 2021-06-24 MED ORDER — VANCOMYCIN HCL 1500 MG/300ML IV SOLN
1500.0000 mg | Freq: Once | INTRAVENOUS | Status: AC
  Administered 2021-06-24: 1500 mg via INTRAVENOUS
  Filled 2021-06-24 (×2): qty 300

## 2021-06-24 MED ORDER — ONDANSETRON HCL 4 MG/2ML IJ SOLN
4.0000 mg | Freq: Once | INTRAMUSCULAR | Status: AC
  Administered 2021-06-24: 4 mg via INTRAVENOUS
  Filled 2021-06-24: qty 2

## 2021-06-24 MED ORDER — GADOBUTROL 1 MMOL/ML IV SOLN
8.0000 mL | Freq: Once | INTRAVENOUS | Status: AC | PRN
  Administered 2021-06-24: 8 mL via INTRAVENOUS

## 2021-06-24 MED ORDER — ACETAMINOPHEN 500 MG PO TABS
1000.0000 mg | ORAL_TABLET | Freq: Four times a day (QID) | ORAL | Status: DC | PRN
  Administered 2021-06-25 – 2021-06-27 (×7): 1000 mg via ORAL
  Filled 2021-06-24 (×7): qty 2

## 2021-06-24 MED ORDER — SODIUM CHLORIDE 0.9 % IV SOLN
2.0000 g | Freq: Once | INTRAVENOUS | Status: DC
  Filled 2021-06-24: qty 20

## 2021-06-24 MED ORDER — HYDROMORPHONE HCL 1 MG/ML IJ SOLN
1.0000 mg | INTRAMUSCULAR | Status: DC | PRN
  Administered 2021-06-24: 1 mg via INTRAVENOUS
  Filled 2021-06-24: qty 1

## 2021-06-24 MED ORDER — SODIUM CHLORIDE 0.9 % IV SOLN: INTRAVENOUS | Status: DC

## 2021-06-24 MED ORDER — LACTATED RINGERS IV SOLN: INTRAVENOUS | Status: DC

## 2021-06-24 MED ORDER — IBUPROFEN 400 MG PO TABS
600.0000 mg | ORAL_TABLET | Freq: Once | ORAL | Status: AC
Start: 2021-06-24 — End: 2021-06-24
  Administered 2021-06-24: 600 mg via ORAL
  Filled 2021-06-24: qty 2

## 2021-06-24 MED ORDER — COLCHICINE 0.6 MG PO TABS
0.6000 mg | ORAL_TABLET | Freq: Two times a day (BID) | ORAL | Status: DC
  Administered 2021-06-24 – 2021-06-30 (×12): 0.6 mg via ORAL
  Filled 2021-06-24 (×12): qty 1

## 2021-06-24 MED ORDER — CEFEPIME HCL 2 G IV SOLR
2.0000 g | Freq: Three times a day (TID) | INTRAVENOUS | Status: DC
  Administered 2021-06-24 – 2021-06-27 (×10): 2 g via INTRAVENOUS
  Filled 2021-06-24 (×2): qty 12.5
  Filled 2021-06-24: qty 2
  Filled 2021-06-24 (×2): qty 12.5
  Filled 2021-06-24: qty 2
  Filled 2021-06-24: qty 12.5
  Filled 2021-06-24: qty 2
  Filled 2021-06-24 (×2): qty 12.5
  Filled 2021-06-24: qty 2

## 2021-06-24 MED ORDER — VANCOMYCIN HCL IN DEXTROSE 1-5 GM/200ML-% IV SOLN: 1000.0000 mg | Freq: Two times a day (BID) | INTRAVENOUS | Status: DC

## 2021-06-24 MED ORDER — VANCOMYCIN HCL IN DEXTROSE 1-5 GM/200ML-% IV SOLN
1000.0000 mg | Freq: Two times a day (BID) | INTRAVENOUS | Status: DC
  Administered 2021-06-25 – 2021-06-27 (×6): 1000 mg via INTRAVENOUS
  Filled 2021-06-24 (×7): qty 200

## 2021-06-24 MED ORDER — SODIUM CHLORIDE 0.9 % IV BOLUS
1000.0000 mL | Freq: Once | INTRAVENOUS | Status: AC
  Administered 2021-06-24: 1000 mL via INTRAVENOUS

## 2021-06-24 MED ORDER — MORPHINE SULFATE (PF) 4 MG/ML IV SOLN
4.0000 mg | Freq: Once | INTRAVENOUS | Status: AC
  Administered 2021-06-24: 4 mg via INTRAVENOUS
  Filled 2021-06-24: qty 1

## 2021-06-24 MED ORDER — OXYCODONE-ACETAMINOPHEN 5-325 MG PO TABS
1.0000 | ORAL_TABLET | ORAL | Status: DC | PRN
  Administered 2021-06-26: 1 via ORAL
  Filled 2021-06-24: qty 1

## 2021-06-24 MED ORDER — POTASSIUM CHLORIDE CRYS ER 20 MEQ PO TBCR
40.0000 meq | EXTENDED_RELEASE_TABLET | Freq: Once | ORAL | Status: AC
  Administered 2021-06-24: 40 meq via ORAL
  Filled 2021-06-24: qty 2

## 2021-06-24 MED ORDER — ACETAMINOPHEN 325 MG PO TABS
650.0000 mg | ORAL_TABLET | Freq: Four times a day (QID) | ORAL | Status: DC | PRN
  Administered 2021-06-24: 650 mg via ORAL
  Filled 2021-06-24: qty 2

## 2021-06-24 MED ORDER — SODIUM CHLORIDE 0.9 % IV BOLUS (SEPSIS)
1000.0000 mL | Freq: Once | INTRAVENOUS | Status: AC
  Administered 2021-06-24: 1000 mL via INTRAVENOUS

## 2021-06-24 MED ORDER — ACETAMINOPHEN 500 MG PO TABS
1000.0000 mg | ORAL_TABLET | Freq: Once | ORAL | Status: AC
  Administered 2021-06-24: 1000 mg via ORAL
  Filled 2021-06-24: qty 2

## 2021-06-24 MED ORDER — ONDANSETRON HCL 4 MG/2ML IJ SOLN: 4.0000 mg | Freq: Three times a day (TID) | INTRAMUSCULAR | Status: DC | PRN

## 2021-06-24 NOTE — Assessment & Plan Note (Addendum)
Sepsis due to septic joint of right SI joint: Patient meets criteria for sepsis with fever 101.3, heart rate 112.  Lactic acid is normal.  Currently hemodynamically stable. 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  Patient also has joint pain in left shoulder, left elbow, bilateral knees.  X-ray showed effusion, but no bony fracture.  Consulted with Dr. Okey Dupre of Ortho.  Also consult IR, PA Lynnette Caffey will help to do right SI joint aspiration tomorrow. -Pain control: As needed Percocet, Dilaudid, Tylenol -Vancomycin and cefepime -blood culture -Check procalcitonin level --> <0.10 -IVF: 1 L normal saline -check RPR

## 2021-06-24 NOTE — ED Notes (Signed)
Pt noted febrile, T=101.3 rectal

## 2021-06-24 NOTE — Assessment & Plan Note (Signed)
-  see above 

## 2021-06-24 NOTE — Assessment & Plan Note (Signed)
  BMI= 30.18   and BW= 74.8 -Diet and exercise.   -Encouraged to lose weight.

## 2021-06-24 NOTE — ED Notes (Signed)
ED Provider at bedside. 

## 2021-06-24 NOTE — Assessment & Plan Note (Signed)
Uric acid elevated 8.9 -Restart colchicine 0.6 mg twice daily

## 2021-06-24 NOTE — Assessment & Plan Note (Signed)
MRI showed mild lower lumbar disc and endplate degeneration at L4-L5 and L5-S1. No spinal stenosis, but at the former broad-based disc bulge or protrusion with annular fissure and mild endplate spurring result in mild foraminal stenosis. Query right L4 radiculitis.  -consulted Dr. Marcell Barlow for neurosurgery -Pain control -As needed Robaxin

## 2021-06-24 NOTE — Progress Notes (Signed)
Pharmacy Antibiotic Note  Mason Russell is a 37 y.o. male admitted on 06/24/2021 with  septic joint in right hip .  Pharmacy has been consulted for vancomycin and cefepime dosing.   Vancomycin 1500 mg loading dose x 1 ordered. Renal function stable and c/w baseline  Plan: Vancomycin 1,000 mg every 12 hours  Cefepime 2 grams every 8 hours  Monitor renal function and cultures. Levels at steady state.  Height: 5\' 2"  (157.5 cm) Weight: 74.8 kg (165 lb) IBW/kg (Calculated) : 54.6  Temp (24hrs), Avg:100.1 F (37.8 C), Min:99 F (37.2 C), Max:101.3 F (38.5 C)  Recent Labs  Lab 06/24/21 0545  WBC 10.8*  CREATININE 0.68  LATICACIDVEN 1.7    Estimated Creatinine Clearance: 113.2 mL/min (by C-G formula based on SCr of 0.68 mg/dL).    No Known Allergies  Antimicrobials this admission: vancomycin 6/17 >>  ceftriaxone 6/17 >> 6/17 (never given) Cefepime 6/17 >>   Dose adjustments this admission: N/a  Microbiology results: 6/17 BCx: in process 6/17 UCx: in process   Thank you for allowing pharmacy to be a part of this patient's care.  7/17, PharmD Pharmacy Resident  06/24/2021 9:37 AM

## 2021-06-24 NOTE — ED Notes (Signed)
RN to room to introduce self to pt as he is back from MRI. MRI tech left pt in room, not attached to monitor and no call bell within reach. This RN hooked pt back up to monitor and placed call bell within reach.

## 2021-06-24 NOTE — Assessment & Plan Note (Signed)
Potassium 3.3 -Repleted potassium -Check magnesium level --> 2.3

## 2021-06-24 NOTE — ED Notes (Signed)
RN aware bed assigned ?

## 2021-06-24 NOTE — ED Triage Notes (Signed)
Pt here for c/o rt lower back pain radiating to Rt leg , shooting pain , progressively worse for past 3 days. Takes flexeril which afforded no relief.  Denies urinary problems, fever at this time. Pt presehnts to ED AAOx4, respi even-unlabored

## 2021-06-24 NOTE — ED Provider Notes (Signed)
[facility] Provider Note    Event Date/Time   First MD Initiated Contact with Patient 06/24/21 [phone]     (approximate)   History   Back Pain   HPI  Mason Russell is a 36 y.o. male with history of scoliosis, gout who presents to the emergency department with several days of right posterior hip pain and lower back pain.  Denies any known injury.  States he had Flexeril at home from a previous injury and has been taking that along with Tylenol without much relief.  States his last dose of Tylenol was 3 days ago.  No known fever but states he has felt very hot intermittently.  He denies any cough, chest pain, shortness of breath, sore throat, ear pain, abdominal pain, vomiting, diarrhea, dysuria, hematuria, penile discharge.  No previous back surgery or epidural injection.  No history of IV drug abuse.  States pain is worse with any movement of the right hip, walking.  He has been using crutches.  He states the pain will radiate down the right leg and up the back with movement.  He denies any numbness, tingling or focal weakness other than weakness in the leg due to pain.  He denies any bowel or bladder incontinence.  No urinary retention.   Also complains of left shoulder pain and pain with moving the left arm that has not been ongoing for over 2 weeks.  No injury to his shoulder.   Patient brought in by EMS.    History provided by patient and EMS.    Past Medical History:  Diagnosis Date   Gout    Scoliosis     Past Surgical History:  Procedure Laterality Date   NO PAST SURGERIES      MEDICATIONS:  Prior to Admission medications   Medication Sig Start Date End Date Taking? Authorizing Provider  acetaminophen (TYLENOL) 500 MG tablet Take by mouth.    [provider]  colchicine 0.6 MG tablet Take 1 tablet (0.6 mg total) by mouth 2 (two) times daily. 05/16/21 05/16/22  Fisher, Roselyn Bering, PA-C  cyclobenzaprine (FLEXERIL) 5 MG tablet Take 1 tablet  (5 mg total) by mouth 3 (three) times daily as needed. 05/13/21   Menshew, Charlesetta Ivory, PA-C  meloxicam (MOBIC) 15 MG tablet Take 1 tablet (15 mg total) by mouth daily. 05/16/21 05/16/22  Fisher, Roselyn Bering, PA-C  predniSONE (DELTASONE) 10 MG tablet Take 6 tablets on the first day and decrease by 1 tablet daily until complete 07/01/20   Gareth Morgan    Physical Exam   Triage Vital Signs: ED Triage Vitals  Enc Vitals Group     BP 06/24/21 0524 (!) 140/100     Pulse Rate 06/24/21 0524 (!) 112     Resp 06/24/21 0524 16     Temp 06/24/21 0524 100 F (37.8 C)     Temp Source 06/24/21 0524 Oral     SpO2 06/24/21 0524 95 %     Weight 06/24/21 0521 165 lb (74.8 kg)     Height 06/24/21 0521 5\' 2"  (1.575 m)     Head Circumference --      Peak Flow --      Pain Score --      Pain Loc --      Pain Edu? --      Excl. in GC? --     Most recent vital signs: Vitals:   06/24/21 0538 06/24/21 0600  BP:  Pulse:  98  Resp:  15  Temp: (!) 101.3 F (38.5 C)   SpO2:  99%    CONSTITUTIONAL: Alert and oriented and responds appropriately to questions.  Appears uncomfortable.  Rectal temp of 101.3. HEAD: Normocephalic, atraumatic EYES: Conjunctivae clear, pupils appear equal, sclera nonicteric ENT: normal nose; moist mucous membranes NECK: Supple, normal ROM CARD: Regular and tachycardic; S1 and S2 appreciated; no murmurs, no clicks, no rubs, no gallops RESP: Normal chest excursion without splinting or tachypnea; breath sounds clear and equal bilaterally; no wheezes, no rhonchi, no rales, no hypoxia or respiratory distress, speaking full sentences ABD/GI: Normal bowel sounds; non-distended; soft, non-tender, no rebound, no guarding, no peritoneal signs BACK: The back appears normal, no rash or other lesions, no soft tissue swelling, no redness.  Tender to palpation over the lower lumbar spine diffusely without step-off or deformity.  Most of his tenderness seems to be over the right posterior  hip, SI joint. EXT: No to palpation over the right posterior hip and significant pain with any movement of the right hip including flexion, internal and external rotation.  No leg length discrepancy.  2+ right DP pulse.  Compartments in the right leg are soft.  Normal capillary refill.  No other tenderness in the right lower extremity.  No calf tenderness or calf swelling. SKIN: Normal color for age and race; warm; no rash on exposed skin NEURO: Moves all extremities equally, normal speech PSYCH: The patient's mood and manner are appropriate.   ED Results / Procedures / Treatments   LABS: (all labs ordered are listed, but only abnormal results are displayed) Labs Reviewed  CBC WITH DIFFERENTIAL/PLATELET - Abnormal; Notable for the following components:      Result Value   WBC 10.8 (*)    Platelets 446 (*)    Neutro Abs 8.0 (*)    Monocytes Absolute 1.2 (*)    All other components within normal limits  URINALYSIS, ROUTINE W REFLEX MICROSCOPIC - Abnormal; Notable for the following components:   Color, Urine YELLOW (*)    APPearance HAZY (*)    Hgb urine dipstick SMALL (*)    Ketones, ur 5 (*)    Bacteria, UA RARE (*)    All other components within normal limits  COMPREHENSIVE METABOLIC PANEL - Abnormal; Notable for the following components:   Potassium 3.3 (*)    Glucose, Bld 145 (*)    Total Protein 8.9 (*)    Total Bilirubin 1.3 (*)    All other components within normal limits  SARS CORONAVIRUS 2 BY RT PCR  URINE CULTURE  CULTURE, BLOOD (ROUTINE X 2)  CULTURE, BLOOD (ROUTINE X 2)  LACTIC ACID, PLASMA  SEDIMENTATION RATE  C-REACTIVE PROTEIN  PROCALCITONIN     EKG:  EKG Interpretation  Date/Time:  Saturday June 24 2021 05:53:44 EDT Ventricular Rate:  103 PR Interval:  128 QRS Duration: 84 QT Interval:  340 QTC Calculation: 445 R Axis:   47 Text Interpretation: Sinus tachycardia Probable left atrial enlargement Confirmed by Rochele Raring [phone]) on 06/24/2021 6:25:05  AM         RADIOLOGY: My personal review and interpretation of imaging: X-rays, MRI pending.  I have personally reviewed all radiology reports.   No results found.   PROCEDURES:  Critical Care performed: Yes, see critical care procedure note(s)   CRITICAL CARE Performed by: Rochele Raring   Total critical care time: 50 minutes  Critical care time was exclusive of separately billable procedures and treating other patients.  Critical care was necessary to treat or prevent imminent or life-threatening deterioration.  Critical care was time spent personally by me on the following activities: development of treatment plan with patient and/or surrogate as well as nursing, discussions with consultants, evaluation of patient's response to treatment, examination of patient, obtaining history from patient or surrogate, ordering and performing treatments and interventions, ordering and review of laboratory studies, ordering and review of radiographic studies, pulse oximetry and re-evaluation of patient's condition.   Marland Kitchen1-3 Lead EKG Interpretation  Performed by: Seung Nidiffer, Layla Maw, DO Authorized by: Penn Grissett, Layla Maw, DO     Interpretation: abnormal     ECG rate:  112   ECG rate assessment: tachycardic     Rhythm: sinus tachycardia     Ectopy: none     Conduction: normal       IMPRESSION / MDM / ASSESSMENT AND PLAN / ED COURSE  I reviewed the triage vital signs and the nursing notes.    Patient here with back pain, right hip pain.  Found to be febrile here in the emergency department.  The patient is on the cardiac monitor to evaluate for evidence of arrhythmia and/or significant heart rate changes.   DIFFERENTIAL DIAGNOSIS (includes but not limited to):   Radiculopathy, herniated disc, osteomyelitis, discitis, epidural abscess, septic joint, bacteremia.  No symptoms to suggest cauda equina.  No other infectious symptoms today including cough, chest pain, shortness of breath,  abdominal pain, urinary symptoms, vomiting or diarrhea.   Patient's presentation is most consistent with acute presentation with potential threat to life or bodily function.   PLAN: We will obtain CBC, CMP, inflammatory markers, blood cultures, urinalysis, urine culture, COVID swab, chest x-ray.  We will start with x-rays of the lumbar spine, right hip and left shoulder.  The left shoulder pain seems to be ongoing for 2+ weeks and I do not think is the source of fever.  There is no redness or warmth or soft tissue swelling to this area.  I am quite concerned however about a septic hip given he has significant pain with any movement of the right hip.  I think ultimately he is going to need an MRI of the lumbar spine with and without contrast and MRI of the right hip.  Will give IV fluids, Tylenol and pain medicine.  He does meet SIRS criteria at this time but is nontoxic in appearance.  Would like to hold antibiotics until after we have confirmed where the infection is coming from in case this is a septic joint and he would need aspiration prior to antibiotics to help direct therapy.   MEDICATIONS GIVEN IN ED: Medications  gadobutrol (GADAVIST) 1 MMOL/ML injection 8 mL (has no administration in time range)  sodium chloride 0.9 % bolus 1,000 mL (1,000 mLs Intravenous New Bag/Given 06/24/21 0549)  acetaminophen (TYLENOL) tablet 1,000 mg (1,000 mg Oral Given 06/24/21 0550)  morphine (PF) 4 MG/ML injection 4 mg (4 mg Intravenous Given 06/24/21 0549)  ondansetron (ZOFRAN) injection 4 mg (4 mg Intravenous Given 06/24/21 0549)     ED COURSE: Patient's labs show leukocytosis of 10.8 with left shift.  No significant electrolyte derangement.  Normal creatinine.  Lactic normal.  Urine does not appear infected.  COVID-negative.  Patient taken to MRI prior to x-rays.  We will cancel the x-ray of his lumbar spine, hip.  He still needs a chest x-ray and left shoulder x-ray.  Signed out the oncoming ED physician.  I  reviewed all nursing notes  and pertinent previous records as available.  I have reviewed and interpreted any and all EKGs, lab and urine results, imaging and radiology reports (as available).    CONSULTS: Dispo pending further work-up.  Admission considered given patient meets SIRS criteria.   OUTSIDE RECORDS REVIEWED: Reviewed patient's last office visit with Thornton Dales on 05/27/2019 and with Leim Fabry with orthopedics on 06/23/2019.       FINAL CLINICAL IMPRESSION(S) / ED DIAGNOSES   Final diagnoses:  Acute right-sided low back pain, unspecified whether sciatica present  Right hip pain  Fever, unspecified fever cause     Rx / DC Orders   ED Discharge Orders     None        Note:  This document was prepared using Dragon voice recognition software and may include unintentional dictation errors.   Teddrick Mallari, Delice Bison, DO 06/24/21 [phone]

## 2021-06-24 NOTE — Consult Note (Signed)
ORTHOPAEDIC CONSULTATION  REQUESTING PHYSICIAN: Lorretta Harp, MD  Chief Complaint: Right posterior hip pain  HPI: Mason Russell is a 36 y.o. male who complains of worsening right posterior hip pain, for which he presented to the ER.  Advanced imaging including right hip and lumbar MRIs were obtained.  This showed concern for septic SI joint.  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  Orthopedics was consulted regarding considerations and further management.  Past medical history notable for gout.  Patient reports recent gout attack in the left knee and is currently symptomatic. Patient works at [employer].  Past Medical History:  Diagnosis Date   Gout    Obesity with body mass index (BMI) of 30.0 to 39.9    Scoliosis    Past Surgical History:  Procedure Laterality Date   NO PAST SURGERIES     Social History   Socioeconomic History   Marital status: Single    Spouse name: Not on file   Number of children: Not on file   Years of education: Not on file   Highest education level: Not on file  Occupational History   Not on file  Tobacco Use   Smoking status: Former    Types: Cigarettes   Smokeless tobacco: Never  Vaping Use   Vaping Use: Never used  Substance and Sexual Activity   Alcohol use: Yes    Comment: occasionally   Drug use: Yes    Types: Marijuana   Sexual activity: Not on file  Other Topics Concern   Not on file  Social History Narrative   Not on file   Social Determinants of Health   Financial Resource Strain: Not on file  Food Insecurity: Not on file  Transportation Needs: Not on file  Physical Activity: Not on file  Stress: Not on file  Social Connections: Not on file   Family History  Problem Relation Age of Onset   Cancer Mother    No Known Allergies Prior to Admission medications   Medication Sig Start Date End Date Taking? Authorizing Provider  acetaminophen (TYLENOL) 500 MG  tablet Take by mouth. Patient not taking: Reported on 06/24/2021    [provider]  colchicine 0.6 MG tablet Take 1 tablet (0.6 mg total) by mouth 2 (two) times daily. Patient not taking: Reported on 06/24/2021 05/16/21 05/16/22  Sherrie Mustache Roselyn Bering, PA-C  cyclobenzaprine (FLEXERIL) 5 MG tablet Take 1 tablet (5 mg total) by mouth 3 (three) times daily as needed. Patient not taking: Reported on 06/24/2021 05/13/21   Menshew, Charlesetta Ivory, PA-C  meloxicam (MOBIC) 15 MG tablet Take 1 tablet (15 mg total) by mouth daily. Patient not taking: Reported on 06/24/2021 05/16/21 05/16/22  Sherrie Mustache Roselyn Bering, PA-C  predniSONE (DELTASONE) 10 MG tablet Take 6 tablets on the first day and decrease by 1 tablet daily until complete Patient not taking: Reported on 06/24/2021 07/01/20   Gareth Morgan   DG Knee 1-2 Views Right  Result Date: 06/24/2021 CLINICAL DATA:  Acute RIGHT knee pain and swelling. Initial encounter. EXAM: RIGHT KNEE - 2 VIEW COMPARISON:  None Available. FINDINGS: A small knee effusion is present. There is no evidence of acute fracture, subluxation or dislocation. No radiographic evidence of acute osteomyelitis noted. No focal bony lesions are present. IMPRESSION: Small knee effusion without acute bony abnormality. Electronically Signed   By: Harmon Pier M.D.   On: 06/24/2021 12:12   DG Elbow 2 Views  Left  Result Date: 06/24/2021 CLINICAL DATA:  Left elbow swelling. Question septic joint. EXAM: LEFT ELBOW - 2 VIEW COMPARISON:  None Available. FINDINGS: Left elbow effusion is present. Soft tissue swelling is present about the elbow. Osseous structures are unremarkable. IMPRESSION: Left elbow effusion and soft tissue swelling without underlying fracture. Infection is not excluded. Electronically Signed   By: Marin Roberts M.D.   On: 06/24/2021 12:11   DG Knee 1-2 Views Left  Result Date: 06/24/2021 CLINICAL DATA:  LEFT knee pain and swelling.  Initial encounter. EXAM: LEFT KNEE - 2 VIEW  COMPARISON:  None Available. FINDINGS: No acute fracture, subluxation or dislocation identified. A small to moderate knee effusion is present. No radiographic evidence of acute osteomyelitis noted. No focal bony lesions are noted. IMPRESSION: Small to moderate knee effusion without acute bony abnormality. Electronically Signed   By: Harmon Pier M.D.   On: 06/24/2021 12:11   DG Chest Portable 1 View  Result Date: 06/24/2021 CLINICAL DATA:  36 year old male with a history of fever EXAM: PORTABLE CHEST 1 VIEW COMPARISON:  None FINDINGS: Cardiomediastinal silhouette within normal limits. Low lung volumes accentuating the central vasculature. No interlobular septal thickening. No pneumothorax or pleural effusion. No confluent airspace disease. No displaced fracture IMPRESSION: Low lung volumes without acute finding. Electronically Signed   By: Gilmer Mor D.O.   On: 06/24/2021 08:11   MR HIP RIGHT W WO CONTRAST  Result Date: 06/24/2021 CLINICAL DATA:  Septic arthritis suspected, hip, xray done. Right hip and leg pain EXAM: MRI OF THE RIGHT HIP WITHOUT AND WITH CONTRAST TECHNIQUE: Multiplanar, multisequence MR imaging was performed both before and after administration of intravenous contrast. CONTRAST:  53mL GADAVIST GADOBUTROL 1 MMOL/ML IV SOLN COMPARISON:  X-ray 07/01/2020 FINDINGS: Bones: Avascular necrosis of the bilateral femoral heads, involving a greater area on the left. Relatively little adjacent bone marrow edema. Preservation of the femoral head contours without evidence of subarticular collapse. No acute fracture. No dislocation. Bony pelvis intact without diastasis. Small right sacroiliac joint effusion with mild periarticular bone marrow edema, predominantly on the iliac side of the joint (series 13, images 12-15). No discrete erosion. No confluent low T1 signal changes to suggest osteomyelitis at this time. Left sacroiliac joint and pubic symphysis are within normal limits. No marrow replacing bone  lesion. Articular cartilage and labrum Articular cartilage: No focal cartilage defect. No subchondral marrow signal changes. Labrum:  Grossly intact.  No paralabral cyst. Joint or bursal effusion Joint effusion: No hip joint effusion. Small right SI joint effusion. Bursae: No abnormal bursal fluid collection. Muscles and tendons Muscles and tendons: The gluteal, hamstring, iliopsoas, rectus femoris, and adductor tendons appear intact without tear or significant tendinosis. Intramuscular edema within the deep fibers of the right iliacus muscle overlying the right sacroiliac joint (series 13, image 18). There is also minimal intramuscular edema along the traversing right piriformis muscle. No intramuscular fluid collection. Otherwise normal muscle bulk and signal intensity without edema, atrophy, or fatty infiltration. Other findings Miscellaneous: Trace fluid tracks superficial to the right iliacus muscle and along the right pelvic sidewall. No organized or rim enhancing fluid collection is seen. No inguinal lymphadenopathy. IMPRESSION: 1. Small right SI joint effusion with mild periarticular bone marrow edema as well as adjacent soft tissue edema with trace fluid. Findings are suspicious for septic arthritis given the patient's history. 2. Avascular necrosis of the bilateral femoral heads, left greater than right. No evidence of subarticular collapse. 3. No evidence of septic arthritis involving the  bilateral hips. Electronically Signed   By: Duanne Guess D.O.   On: 06/24/2021 08:04   DG Shoulder Left  Result Date: 06/24/2021 CLINICAL DATA:  36 year old male with history of fever and back pain. Right-sided hip pain. Left shoulder pain. EXAM: LEFT SHOULDER - 2+ VIEW COMPARISON:  No priors. FINDINGS: There is no evidence of fracture or dislocation. There is no evidence of arthropathy or other focal bone abnormality. Soft tissues are unremarkable. IMPRESSION: Negative. Electronically Signed   By: Trudie Reed M.D.   On: 06/24/2021 07:54   MR Lumbar Spine W Wo Contrast  Result Date: 06/24/2021 CLINICAL DATA:  36 year old male with right lower back pain radiating to the leg, progressive for 3 days. EXAM: MRI LUMBAR SPINE WITHOUT AND WITH CONTRAST TECHNIQUE: Multiplanar and multiecho pulse sequences of the lumbar spine were obtained without and with intravenous contrast. CONTRAST:  55mL GADAVIST GADOBUTROL 1 MMOL/ML IV SOLN COMPARISON:  None Available. FINDINGS: Segmentation: Lumbar segmentation appears to be normal and will be designated as such for this report. Alignment: Straightening of lumbar lordosis. Slight lumbar scoliosis. No significant spondylolisthesis. Vertebrae: No marrow edema or evidence of acute osseous abnormality. Visualized bone marrow signal is within normal limits. Intact visible sacrum and SI joints. Conus medullaris and cauda equina: Conus extends to the T11-T12 level. No lower spinal cord or conus signal abnormality. Capacious lumbar spinal canal. Normal cauda equina nerve roots. No abnormal intradural enhancement or dural thickening. Paraspinal and other soft tissues: Negative. Disc levels: T11-T12: Negative. T12-L1:  Negative. L1-L2:  Negative. L2-L3:  Negative. L3-L4:  Negative. L4-L5: Subtle disc desiccation and disc bulging eccentric to the right. Right foraminal disc bulge with endplate spurring and annular fissure (series 9, image 27 and series 7, image 4) which is enhancing. No spinal or lateral recess stenosis. Mild right L4 neural foraminal stenosis. L5-S1: Mild disc desiccation and disc space loss. Mild circumferential disc bulging with endplate spurring. No spinal or lateral recess stenosis. Symmetric mild L5 foraminal stenosis. IMPRESSION: Mild lower lumbar disc and endplate degeneration at L4-L5 and L5-S1. No spinal stenosis, but at the former broad-based disc bulge or protrusion with annular fissure and mild endplate spurring result in mild foraminal stenosis. Query right  L4 radiculitis. Electronically Signed   By: Odessa Fleming M.D.   On: 06/24/2021 07:40    Positive ROS: All other systems have been reviewed and were otherwise negative with the exception of those mentioned in the HPI and as above.  Physical Exam: General: Alert, no acute distress Cardiovascular: No pedal edema Respiratory: No cyanosis, no use of accessory musculature GI: No organomegaly, abdomen is soft and non-tender Skin: No lesions in the area of chief complaint Neurologic: Sensation intact distally Psychiatric: Patient is competent for consent with normal mood and affect Lymphatic: No axillary or cervical lymphadenopathy  MUSCULOSKELETAL:   Right hip, tenderness posteriorly at the SI joint, no tenderness over the greater trochanter, no tenderness anteriorly, tolerates hip logroll without pain or discomfort able to actively flex at the hip without discomfort, no swelling or tenderness at the knee with full range of motion, right lower extremity is grossly motor and sensory intact  Left lower extremity: No tenderness about the hip, left knee with tenderness along the medial lateral joint line, no evidence of effusion, mild warmth, knee is resting in 20 degrees of flexion, there is pain with attempted active or passive range of motion of the knee   Assessment: 36 year old male admitted with fever, elevated inflammatory markers, tenderness over  the right SI joint and MRI evidence showing SI joint effusion.  Patient also has clinical exam findings of gout of the left knee  Plan: Recommend IR aspiration of the SI joint.  Broad-spectrum antibiotics and narrow as needed.  If patient fails medical management, may consider consultation with spine surgery regarding I&D of the SI joint.  With respect to the left knee, recommend colchicine and NSAIDs, pain control.    Ross Marcus, MD    06/24/2021 3:58 PM

## 2021-06-24 NOTE — H&P (Addendum)
History and Physical    Mason Russell IWL:[id number] DOB: 04-09-1985 DOA: 06/24/2021  Referring MD/NP/PA:   PCP: System, Provider Not In   Patient coming from:  The patient is coming from home.  At baseline, pt is independent for most of ADL.        Chief Complaint: Lower back pain, multiple joint pain including bilateral knees, left shoulder and left elbow  HPI: Mason Russell is a 36 y.o. 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  Patient states that his lower back pain and multiple joint pain started 3 days ago, which is constant, severe, 10 out of 10 in severity.  No injury.  His lower back pain does not radiate into the legs, no leg numbness or weakness.  No urinary incontinence or loss control of bowel movement.  Patient has fever 101.3 in the ED, no chills.  Denies chest pain, cough, shortness breath.  No nausea, vomiting, diarrhea or abdominal pain.  No symptoms of UTI.  No rashes.  Patient states that he is not sexually active.   Data Reviewed and ED Course: pt was found to have WBC 10.8, negative COVID PCR, CRP 196, ESR 83, negative urinalysis, potassium 3.3, GFR> 60, temperature 101.3, blood pressure 111/85, heart rate of 112, RR 16, oxygen saturation 95% on room air.  Chest x-ray negative.  X-ray of left shoulder negative.  Patient is admitted to [facility] bed as inpatient.  Dr. Sharlet Salina of Ortho is consulted and Dr. Cari Caraway of neurosurgery is consulted.  MRI-L spin Mild lower lumbar disc and endplate degeneration at L4-L5 and L5-S1. No spinal stenosis, but at the former broad-based disc bulge or protrusion with annular fissure and mild endplate spurring result in mild foraminal stenosis. Query right L4  radiculitis.   MRI-right hip 1. Small right SI joint effusion with mild periarticular bone marrow edema as well as adjacent soft tissue edema with trace fluid. Findings are suspicious for septic arthritis given the patient's history. 2. Avascular necrosis of the bilateral femoral heads, left greater than right. No evidence of subarticular collapse. 3. No evidence of septic arthritis involving the bilateral hips.  X-ray of left shoulder, left elbow, bilateral knees showed no bony fracture, but all with fluid effusion.   EKG: I have personally reviewed.  Sinus rhythm, QTc 445, LAE, early R wave progression, nonspecific T wave change   Review of Systems:   General: Has fevers, no chills, no body weight gain, has fatigue HEENT: no blurry vision, hearing changes or sore throat Respiratory: no dyspnea, coughing, wheezing CV: no chest pain, no palpitations GI: no nausea, vomiting, abdominal pain, diarrhea, constipation GU: no dysuria, burning on urination, increased urinary frequency, hematuria  Ext: no leg edema Neuro: no unilateral weakness, numbness, or tingling, no vision change or hearing loss Skin: no rash, no skin tear. MSK: Has lower back pain, has pain in right hip, both knees, left shoulder and left elbow Heme: No easy bruising.  Travel history: No recent long distant travel.   Allergy: No Known Allergies  Past Medical History:  Diagnosis Date   Gout    Obesity with body mass index (BMI) of 30.0 to 39.9    Scoliosis     Past Surgical History:  Procedure Laterality Date  NO PAST SURGERIES      Social History:  reports that he has quit smoking. His smoking use included cigarettes. He has never used smokeless tobacco. He reports current alcohol use. He reports current drug use. Drug: Marijuana.    Family History: Patient states that his mother had cancer, but he does not know the details Family History  Problem Relation Age of Onset   Cancer Mother       Prior to Admission medications   Medication Sig Start Date End Date Taking? Authorizing Provider  acetaminophen (TYLENOL) 500 MG tablet Take by mouth.    [provider]  colchicine 0.6 MG tablet Take 1 tablet (0.6 mg total) by mouth 2 (two) times daily. 05/16/21 05/16/22  Fisher, Linden Dolin, PA-C  cyclobenzaprine (FLEXERIL) 5 MG tablet Take 1 tablet (5 mg total) by mouth 3 (three) times daily as needed. 05/13/21   Menshew, Dannielle Karvonen, PA-C  meloxicam (MOBIC) 15 MG tablet Take 1 tablet (15 mg total) by mouth daily. 05/16/21 05/16/22  Fisher, Linden Dolin, PA-C  predniSONE (DELTASONE) 10 MG tablet Take 6 tablets on the first day and decrease by 1 tablet daily until complete 07/01/20   Danton Clap, [place]    Physical Exam: Vitals:   06/24/21 0900 06/24/21 0930 06/24/21 1000 06/24/21 1056  BP: 120/88 111/84 120/83 119/87  Pulse: (!) 105  (!) 105 84  Resp: '16  16 18  ' Temp: 99 F (37.2 C)  98.9 F (37.2 C) 98.8 F (37.1 C)  TempSrc: Oral     SpO2: 98%  98% 100%  Weight:      Height:       General: Not in acute distress HEENT:       Eyes: PERRL, EOMI, no scleral icterus.       ENT: No discharge from the ears and nose, no pharynx injection, no tonsillar enlargement.        Neck: No JVD, no bruit, no mass felt. Heme: No neck lymph node enlargement. Cardiac: S1/S2, RRR, No murmurs, No gallops or rubs. Respiratory: No rales, wheezing, rhonchi or rubs. GI: Soft, nondistended, nontender, no rebound pain, no organomegaly, BS present. GU: No hematuria Ext: No pitting leg edema bilaterally. 1+DP/PT pulse bilaterally. Musculoskeletal: Has tenderness in the midline of her lower back, right hip, left elbow, left shoulder and bilateral knees.  No significant swelling in left shoulder, left elbow and both knees.  Skin: No rashes.  Neuro: Alert, oriented X3, cranial nerves II-XII grossly intact, moves all extremities normally. Psych: Patient is not psychotic, no suicidal or hemocidal  ideation.  Labs on Admission: I have personally reviewed following labs and imaging studies  CBC: Recent Labs  Lab 06/24/21 0545  WBC 10.8*  NEUTROABS 8.0*  HGB 13.9  HCT 43.4  MCV 92.9  PLT 893*   Basic Metabolic Panel: Recent Labs  Lab 06/24/21 0545 06/24/21 0645  NA 141  --   K 3.3*  --   CL 106  --   CO2 24  --   GLUCOSE 145*  --   BUN 13  --   CREATININE 0.68  --   CALCIUM 9.6  --   MG  --  2.3   GFR: Estimated Creatinine Clearance: 113.2 mL/min (by C-G formula based on SCr of 0.68 mg/dL). Liver Function Tests: Recent Labs  Lab 06/24/21 0545  AST 31  ALT 34  ALKPHOS 69  BILITOT 1.3*  PROT 8.9*  ALBUMIN 3.9   No results for input(s): "  LIPASE", "AMYLASE" in the last 168 hours. No results for input(s): "AMMONIA" in the last 168 hours. Coagulation Profile: Recent Labs  Lab 06/24/21 1110  INR 1.1   Cardiac Enzymes: No results for input(s): "CKTOTAL", "CKMB", "CKMBINDEX", "TROPONINI" in the last 168 hours. BNP (last 3 results) No results for input(s): "PROBNP" in the last 8760 hours. HbA1C: No results for input(s): "HGBA1C" in the last 72 hours. CBG: No results for input(s): "GLUCAP" in the last 168 hours. Lipid Profile: No results for input(s): "CHOL", "HDL", "LDLCALC", "TRIG", "CHOLHDL", "LDLDIRECT" in the last 72 hours. Thyroid Function Tests: No results for input(s): "TSH", "T4TOTAL", "FREET4", "T3FREE", "THYROIDAB" in the last 72 hours. Anemia Panel: No results for input(s): "VITAMINB12", "FOLATE", "FERRITIN", "TIBC", "IRON", "RETICCTPCT" in the last 72 hours. Urine analysis:    Component Value Date/Time   COLORURINE YELLOW (A) 06/24/2021 0545   APPEARANCEUR HAZY (A) 06/24/2021 0545   LABSPEC 1.026 06/24/2021 0545   PHURINE 5.0 06/24/2021 0545   GLUCOSEU NEGATIVE 06/24/2021 0545   HGBUR SMALL (A) 06/24/2021 0545   BILIRUBINUR NEGATIVE 06/24/2021 0545   KETONESUR 5 (A) 06/24/2021 0545   PROTEINUR NEGATIVE 06/24/2021 0545   NITRITE  NEGATIVE 06/24/2021 0545   LEUKOCYTESUR NEGATIVE 06/24/2021 0545   Sepsis Labs: '@LABRCNTIP' (procalcitonin:4,lacticidven:4) ) Recent Results (from the past 240 hour(s))  SARS Coronavirus 2 by RT PCR (hospital order, performed in [facility] lab) *cepheid single result test* Anterior Nasal Swab     Status: None   Collection Time: 06/24/21  5:45 AM   Specimen: Anterior Nasal Swab  Result Value Ref Range Status   SARS Coronavirus 2 by RT PCR NEGATIVE NEGATIVE Final    Comment: (NOTE) SARS-CoV-2 target nucleic acids are NOT DETECTED.  The SARS-CoV-2 RNA is generally detectable in upper and lower respiratory specimens during the acute phase of infection. The lowest concentration of SARS-CoV-2 viral copies this assay can detect is 250 copies / mL. A negative result does not preclude SARS-CoV-2 infection and should not be used as the sole basis for treatment or other patient management decisions.  A negative result may occur with improper specimen collection / handling, submission of specimen other than nasopharyngeal swab, presence of viral mutation(s) within the areas targeted by this assay, and inadequate number of viral copies (<250 copies / mL). A negative result must be combined with clinical observations, patient history, and epidemiological information.  Fact Sheet for Patients:   https://www.patel.info/  Fact Sheet for Healthcare Providers: https://hall.com/  This test is not yet approved or  cleared by the [place] FDA and has been authorized for detection and/or diagnosis of SARS-CoV-2 by FDA under an Emergency Use Authorization (EUA).  This EUA will remain in effect (meaning this test can be used) for the duration of the COVID-19 declaration under Section 564(b)(1) of the Act, 21 U.S.C. section 360bbb-3(b)(1), unless the authorization is terminated or revoked sooner.  Performed at [facility], [address], [place], [place] [address]      Radiological Exams on Admission: DG Knee 1-2 Views Right  Result Date: 06/24/2021 CLINICAL DATA:  Acute RIGHT knee pain and swelling. Initial encounter. EXAM: RIGHT KNEE - 2 VIEW COMPARISON:  None Available. FINDINGS: A small knee effusion is present. There is no evidence of acute fracture, subluxation or dislocation. No radiographic evidence of acute osteomyelitis noted. No focal bony lesions are present. IMPRESSION: Small knee effusion without acute bony abnormality. Electronically Signed   By: Margarette Canada M.D.   On: 06/24/2021 12:12  DG Elbow 2 Views Left  Result Date: 06/24/2021 CLINICAL DATA:  Left elbow swelling. Question septic joint. EXAM: LEFT ELBOW - 2 VIEW COMPARISON:  None Available. FINDINGS: Left elbow effusion is present. Soft tissue swelling is present about the elbow. Osseous structures are unremarkable. IMPRESSION: Left elbow effusion and soft tissue swelling without underlying fracture. Infection is not excluded. Electronically Signed   By: San Morelle M.D.   On: 06/24/2021 12:11   DG Knee 1-2 Views Left  Result Date: 06/24/2021 CLINICAL DATA:  LEFT knee pain and swelling.  Initial encounter. EXAM: LEFT KNEE - 2 VIEW COMPARISON:  None Available. FINDINGS: No acute fracture, subluxation or dislocation identified. A small to moderate knee effusion is present. No radiographic evidence of acute osteomyelitis noted. No focal bony lesions are noted. IMPRESSION: Small to moderate knee effusion without acute bony abnormality. Electronically Signed   By: Margarette Canada M.D.   On: 06/24/2021 12:11   DG Chest Portable 1 View  Result Date: 06/24/2021 CLINICAL DATA:  36 year old male with a history of fever EXAM: PORTABLE CHEST 1 VIEW COMPARISON:  None FINDINGS: Cardiomediastinal silhouette within normal limits. Low lung volumes accentuating the central vasculature. No interlobular septal thickening. No pneumothorax or pleural effusion. No  confluent airspace disease. No displaced fracture IMPRESSION: Low lung volumes without acute finding. Electronically Signed   By: Corrie Mckusick D.O.   On: 06/24/2021 08:11   MR HIP RIGHT W WO CONTRAST  Result Date: 06/24/2021 CLINICAL DATA:  Septic arthritis suspected, hip, xray done. Right hip and leg pain EXAM: MRI OF THE RIGHT HIP WITHOUT AND WITH CONTRAST TECHNIQUE: Multiplanar, multisequence MR imaging was performed both before and after administration of intravenous contrast. CONTRAST:  34m GADAVIST GADOBUTROL 1 MMOL/ML IV SOLN COMPARISON:  X-ray 07/01/2020 FINDINGS: Bones: Avascular necrosis of the bilateral femoral heads, involving a greater area on the left. Relatively little adjacent bone marrow edema. Preservation of the femoral head contours without evidence of subarticular collapse. No acute fracture. No dislocation. Bony pelvis intact without diastasis. Small right sacroiliac joint effusion with mild periarticular bone marrow edema, predominantly on the iliac side of the joint (series 13, images 12-15). No discrete erosion. No confluent low T1 signal changes to suggest osteomyelitis at this time. Left sacroiliac joint and pubic symphysis are within normal limits. No marrow replacing bone lesion. Articular cartilage and labrum Articular cartilage: No focal cartilage defect. No subchondral marrow signal changes. Labrum:  Grossly intact.  No paralabral cyst. Joint or bursal effusion Joint effusion: No hip joint effusion. Small right SI joint effusion. Bursae: No abnormal bursal fluid collection. Muscles and tendons Muscles and tendons: The gluteal, hamstring, iliopsoas, rectus femoris, and adductor tendons appear intact without tear or significant tendinosis. Intramuscular edema within the deep fibers of the right iliacus muscle overlying the right sacroiliac joint (series 13, image 18). There is also minimal intramuscular edema along the traversing right piriformis muscle. No intramuscular fluid  collection. Otherwise normal muscle bulk and signal intensity without edema, atrophy, or fatty infiltration. Other findings Miscellaneous: Trace fluid tracks superficial to the right iliacus muscle and along the right pelvic sidewall. No organized or rim enhancing fluid collection is seen. No inguinal lymphadenopathy. IMPRESSION: 1. Small right SI joint effusion with mild periarticular bone marrow edema as well as adjacent soft tissue edema with trace fluid. Findings are suspicious for septic arthritis given the patient's history. 2. Avascular necrosis of the bilateral femoral heads, left greater than right. No evidence of subarticular collapse. 3. No evidence of  septic arthritis involving the bilateral hips. Electronically Signed   By: Davina Poke D.O.   On: 06/24/2021 08:04   DG Shoulder Left  Result Date: 06/24/2021 CLINICAL DATA:  36 year old male with history of fever and back pain. Right-sided hip pain. Left shoulder pain. EXAM: LEFT SHOULDER - 2+ VIEW COMPARISON:  No priors. FINDINGS: There is no evidence of fracture or dislocation. There is no evidence of arthropathy or other focal bone abnormality. Soft tissues are unremarkable. IMPRESSION: Negative. Electronically Signed   By: Vinnie Langton M.D.   On: 06/24/2021 07:54   MR Lumbar Spine W Wo Contrast  Result Date: 06/24/2021 CLINICAL DATA:  36 year old male with right lower back pain radiating to the leg, progressive for 3 days. EXAM: MRI LUMBAR SPINE WITHOUT AND WITH CONTRAST TECHNIQUE: Multiplanar and multiecho pulse sequences of the lumbar spine were obtained without and with intravenous contrast. CONTRAST:  23m GADAVIST GADOBUTROL 1 MMOL/ML IV SOLN COMPARISON:  None Available. FINDINGS: Segmentation: Lumbar segmentation appears to be normal and will be designated as such for this report. Alignment: Straightening of lumbar lordosis. Slight lumbar scoliosis. No significant spondylolisthesis. Vertebrae: No marrow edema or evidence of acute  osseous abnormality. Visualized bone marrow signal is within normal limits. Intact visible sacrum and SI joints. Conus medullaris and cauda equina: Conus extends to the T11-T12 level. No lower spinal cord or conus signal abnormality. Capacious lumbar spinal canal. Normal cauda equina nerve roots. No abnormal intradural enhancement or dural thickening. Paraspinal and other soft tissues: Negative. Disc levels: T11-T12: Negative. T12-L1:  Negative. L1-L2:  Negative. L2-L3:  Negative. L3-L4:  Negative. L4-L5: Subtle disc desiccation and disc bulging eccentric to the right. Right foraminal disc bulge with endplate spurring and annular fissure (series 9, image 27 and series 7, image 4) which is enhancing. No spinal or lateral recess stenosis. Mild right L4 neural foraminal stenosis. L5-S1: Mild disc desiccation and disc space loss. Mild circumferential disc bulging with endplate spurring. No spinal or lateral recess stenosis. Symmetric mild L5 foraminal stenosis. IMPRESSION: Mild lower lumbar disc and endplate degeneration at L4-L5 and L5-S1. No spinal stenosis, but at the former broad-based disc bulge or protrusion with annular fissure and mild endplate spurring result in mild foraminal stenosis. Query right L4 radiculitis. Electronically Signed   By: HGenevie AnnM.D.   On: 06/24/2021 07:40      Assessment/Plan Principal Problem:   Septic arthritis of sacroiliac joint (HCC) Active Problems:   Sepsis ([place]   Acute lumbar radiculopathy   Gout   Obesity with body mass index (BMI) of 30.0 to 39.9   Hypokalemia   Principal Problem:   Septic arthritis of sacroiliac joint (HCC) Active Problems:   Sepsis ([place]   Acute lumbar radiculopathy   Gout   Obesity with body mass index (BMI) of 30.0 to 39.9   Hypokalemia   Assessment and Plan: * Septic arthritis of [place]roiliac joint ([place] Sepsis due to septic joint of right SI joint: Patient meets criteria for sepsis with fever 101.3, heart rate 112.  Lactic acid is  normal.  Currently hemodynamically stable. 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  Patient also has joint pain in left shoulder, left elbow, bilateral knees.  X-ray showed effusion, but no bony fracture.  Consulted with Dr. CSharlet Salinaof Ortho.  Also consult IR, PA SCandiss Norsewill help to do right SI joint aspiration tomorrow. -Pain control: As needed Percocet, Dilaudid, Tylenol -Vancomycin and cefepime -blood culture -Check procalcitonin level --> <0.10 -IVF:  1 L normal saline -check RPR    Sepsis ([place]) -see above  Acute lumbar radiculopathy MRI showed mild lower lumbar disc and endplate degeneration at L4-L5 and L5-S1. No spinal stenosis, but at the former broad-based disc bulge or protrusion with annular fissure and mild endplate spurring result in mild foraminal stenosis. Query right L4 radiculitis.  -consulted Dr. Cari Caraway for neurosurgery -Pain control -As needed Robaxin   Gout Uric acid elevated 8.9 -Restart colchicine 0.6 mg twice daily  Obesity with body mass index (BMI) of 30.0 to 39.9  BMI= 30.18   and BW= 74.8 -Diet and exercise.   -Encouraged to lose weight.   Hypokalemia Potassium 3.3 -Repleted potassium -Check magnesium level --> 2.3             DVT ppx: SCD  Code Status: Full code  Family Communication: not done, no family member is at bed side.    Disposition Plan:  Anticipate discharge back to previous environment  Consults called:  Dr. Sharlet Salina of ortho.  Dr. Cari Caraway of neurosurgery is consulted.  Admission status and Level of care: Med-Surg:    as inpt      Severity of Illness:  The appropriate patient status for this patient is INPATIENT. Inpatient status is judged to be reasonable and necessary in order to provide the required intensity of service to ensure the patient's safety. The patient's presenting symptoms, physical exam findings, and initial radiographic and laboratory data in the context of their  chronic comorbidities is felt to place them at high risk for further clinical deterioration. Furthermore, it is not anticipated that the patient will be medically stable for discharge from the hospital within 2 midnights of admission.   * I certify that at the point of admission it is my clinical judgment that the patient will require inpatient hospital care spanning beyond 2 midnights from the point of admission due to high intensity of service, high risk for further deterioration and high frequency of surveillance required.*       Date of Service 06/24/2021    Ivor Costa [facility]   If 7PM-7AM, please contact night-coverage [email or web address] 06/24/2021, 3:49 PM

## 2021-06-24 NOTE — ED Provider Notes (Addendum)
Patient received in signout from Dr. Elesa Massed pending MRIs of his lumbar spine and right hip.  Patient is febrile with elevated inflammatory markers and has an examination concerning for a septic joint.  I reviewed these MRIs and they demonstrate right SI joint effusion with periarticular bone marrow edema concerning for a septic joint.  We will initiate antibiotics and consult medicine for admission.  Clinical Course as of 06/24/21 0919  Sat Jun 24, 2021  0840 I consult with Dr. Okey Dupre . He recommends hospital admission and IR consultation for arthrocentesis.  Requests secure chat chart for this patient  [DS]  0845 I consult with IR Dr.Trevor Shick, they'll take a look. Dr. Elby Showers currently in a case and Dr. Miles Costain will pass along the information [DS]  0857 I update the patient of this and my recommendation for admission and he is agreeable. [DS]  7654 I consult with hospitalist who agrees to admit. [DS]    Clinical Course User Index [DS] Delton Prairie, MD   .Critical Care  Performed by: Delton Prairie, MD Authorized by: Delton Prairie, MD   Critical care provider statement:    Critical care time (minutes):  30   Critical care time was exclusive of:  Separately billable procedures and treating other patients   Critical care was necessary to treat or prevent imminent or life-threatening deterioration of the following conditions:  Sepsis   Critical care was time spent personally by me on the following activities:  Development of treatment plan with patient or surrogate, discussions with consultants, evaluation of patient's response to treatment, examination of patient, ordering and review of laboratory studies, ordering and review of radiographic studies, ordering and performing treatments and interventions, pulse oximetry, re-evaluation of patient's condition and review of old charts     Delton Prairie, MD 06/24/21 6503    Delton Prairie, MD 06/24/21 [phone]

## 2021-06-25 ENCOUNTER — Inpatient Hospital Stay: Payer: Self-pay

## 2021-06-25 DIAGNOSIS — E669 Obesity, unspecified: Secondary | ICD-10-CM

## 2021-06-25 DIAGNOSIS — A419 Sepsis, unspecified organism: Secondary | ICD-10-CM

## 2021-06-25 LAB — CBC
HCT: 39.1 % (ref 39.0–52.0)
Hemoglobin: 12.7 g/dL — ABNORMAL LOW (ref 13.0–17.0)
MCH: 29.9 pg (ref 26.0–34.0)
MCHC: 32.5 g/dL (ref 30.0–36.0)
MCV: 92 fL (ref 80.0–100.0)
Platelets: 394 10*3/uL (ref 150–400)
RBC: 4.25 MIL/uL (ref 4.22–5.81)
RDW: 13 % (ref 11.5–15.5)
WBC: 9.8 10*3/uL (ref 4.0–10.5)
nRBC: 0 % (ref 0.0–0.2)

## 2021-06-25 LAB — URINE CULTURE: Culture: <10000 — AB

## 2021-06-25 LAB — LACTIC ACID, PLASMA: Lactic Acid, Venous: 0.9 mmol/L (ref 0.5–1.9)

## 2021-06-25 LAB — RPR: RPR Ser Ql: NONREACTIVE

## 2021-06-25 LAB — BASIC METABOLIC PANEL
Anion gap: 7 (ref 5–15)
BUN: 9 mg/dL (ref 6–20)
CO2: 23 mmol/L (ref 22–32)
Calcium: 8.9 mg/dL (ref 8.9–10.3)
Chloride: 106 mmol/L (ref 98–111)
Creatinine, Ser: 0.61 mg/dL (ref 0.61–1.24)
GFR, Estimated: >60 mL/min (ref >60)
Glucose, Bld: 123 mg/dL — ABNORMAL HIGH (ref 70–99)
Potassium: 3.7 mmol/L (ref 3.5–5.1)
Sodium: 136 mmol/L (ref 135–145)

## 2021-06-25 LAB — GLUCOSE, CAPILLARY: Glucose-Capillary: 128 mg/dL — ABNORMAL HIGH (ref 70–99)

## 2021-06-25 MED ORDER — IOHEXOL 180 MG/ML  SOLN
1.0000 mL | Freq: Once | INTRAMUSCULAR | Status: AC
  Administered 2021-06-25: 1 mL

## 2021-06-25 MED ORDER — LIDOCAINE HCL (PF) 1 % IJ SOLN
2.0000 mL | Freq: Once | INTRAMUSCULAR | Status: AC
  Administered 2021-06-25: 2 mL
  Filled 2021-06-25: qty 2

## 2021-06-25 NOTE — Hospital Course (Signed)
Mason Russell is a 36 y.o. 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  Lumbar IV in the hospital, patient had a fever up to 101.3, heart rate 112, x-rays of shoulder, elbow knees and hips did not show any bony involvement, with some joint effusion.  MRI of the right hip showed right SI joint effusion with mild periarticular bone marrow edema concerning for septic arthritis.  Patient is placed on antibiotics with vancomycin and cefepime, ordered SI joint aspiration per IR.

## 2021-06-25 NOTE — Progress Notes (Signed)
  Progress Note   Patient: Mason Russell ZOX:[id number] DOB: 01/29/1985 DOA: 06/24/2021     1 DOS: the patient was seen and examined on 06/25/2021   Brief hospital course: Mason Russell is a 36 y.o. 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  Lumbar IV in the hospital, patient had a fever up to 101.3, heart rate 112, x-rays of shoulder, elbow knees and hips did not show any bony involvement, with some joint effusion.  MRI of the right hip showed right SI joint effusion with mild periarticular bone marrow edema concerning for septic arthritis.  Patient is placed on antibiotics with vancomycin and cefepime, ordered SI joint aspiration per IR.   Assessment and Plan: Sepsis. Possible septic arthritis of sacroiliac joint. Continue antibiotics with cefepime and vancomycin. SI joint aspiration scheduled for tomorrow. Patient has good appetite, I will discontinue IV fluids. Patient had left arm pain and swelling, ultrasound to rule out DVT.  Lumbar radiculopathy. Continue symptomatic treatment.  Obesity with BMI 30.2. Diet and exercise.  Per kalemia. Improved     Subjective:  Patient complains of left arm swelling, pain all over.  Had a fever yesterday, no additional fever today.  Physical Exam: Vitals:   06/24/21 2219 06/24/21 2348 06/25/21 0437 06/25/21 0945  BP: 122/90 (!) 130/96 105/90 116/73  Pulse: (!) 123 (!) 121 75 71  Resp: 18 20 20 16   Temp: 99.7 F (37.6 C) (!) 101.6 F (38.7 C) 97.6 F (36.4 C) 97.7 F (36.5 C)  TempSrc: Oral Oral Oral Oral  SpO2: 94% 95% 96% 100%  Weight:      Height:       General exam: Appears calm and comfortable  Respiratory system: Clear to auscultation. Respiratory effort  normal. Cardiovascular system: S1 & S2 heard, RRR. No JVD, murmurs, rubs, gallops or clicks. No pedal edema. Gastrointestinal system: Abdomen is nondistended, soft and nontender. No organomegaly or masses felt. Normal bowel sounds heard. Central nervous system: Alert and oriented. No focal neurological deficits. Extremities: Left arm swelling Skin: No rashes, lesions or ulcers Psychiatry: Judgement and insight appear normal. Mood & affect appropriate.   Data Reviewed:  Imaging studies reviewed, all lab results reviewed  Family Communication: Father and Sister at bedside.  Disposition: Status is: Inpatient Remains inpatient appropriate because: Severity of disease, IV treatment.  Planned Discharge Destination: Home    Time spent: 35 minutes  Author: , MD 06/25/2021 1:41 PM  For on call review [email or web address].

## 2021-06-26 ENCOUNTER — Inpatient Hospital Stay: Payer: Self-pay

## 2021-06-26 DIAGNOSIS — E876 Hypokalemia: Secondary | ICD-10-CM

## 2021-06-26 DIAGNOSIS — M1388 Other specified arthritis, other site: Secondary | ICD-10-CM

## 2021-06-26 LAB — GLUCOSE, CAPILLARY: Glucose-Capillary: 163 mg/dL — ABNORMAL HIGH (ref 70–99)

## 2021-06-26 NOTE — Progress Notes (Signed)
  Progress Note   Patient: Mason Russell LAG:[id number] DOB: August 04, 1985 DOA: 06/24/2021     2 DOS: the patient was seen and examined on 06/26/2021   Brief hospital course: Erasmo Vertz is a 36 y.o. 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  Patient has been having joint pain for the last 3 years, he was using crutches for the last 2 years due to pain and weakness After arriving in the hospital, patient had a fever up to 101.3, heart rate 112, x-rays of shoulder, elbow knees and hips did not show any bony involvement, with some joint effusion.  MRI of the right hip showed right SI joint effusion with mild periarticular bone marrow edema concerning for septic arthritis.  Patient is placed on antibiotics with vancomycin and cefepime, ordered SI joint aspiration per IR.    Assessment and Plan: Sepsis. Possible septic arthritis of sacroiliac joint. Patient had about 2 to 3 years of symptoms of joint pain and weakness, joint effusion in multiple sites.  Patient has not using IV drugs for about 20 years. I am not convinced the patient has a infectious process.  ID consult obtained.  Continue antibiotics until infection is ruled out. However, I will consult rheumatology to rule out a possible autoimmune process.  Patient has elevated CRP, send out ESR, antinuclear antibody, rheumatoid factor, antiphospholipid panel.  I will also check hepatitis C.  HIV negative.  Lumbar radiculopathy. Continue symptomatic treatment.  Obesity with BMI 30.2. Diet and exercise.   Hypokalemia. Improved      Subjective:  Patient left arm swelling seem to be better, acute DVT on ultrasound. Still complaining joint pain.  Significant weakness. No  short of breath or cough  Physical Exam: Vitals:   06/25/21 2040 06/25/21 2324 06/26/21 0430 06/26/21 0733  BP: 114/87 117/78 110/73 116/78  Pulse: 94 92 80 79  Resp: _0 Temp: 98.7 F (37.1 C) 98.7 F (37.1 C) 98.6 F (37 C) 98.2 F (36.8 C)  TempSrc:  Oral  Oral  SpO2: 94% 95% 94% 93%  Weight:      Height:       General exam: Appears calm and comfortable  Respiratory system: Clear to auscultation. Respiratory effort normal. Cardiovascular system: S1 & S2 heard, RRR. No JVD, murmurs, rubs, gallops or clicks. No pedal edema. Gastrointestinal system: Abdomen is nondistended, soft and nontender. No organomegaly or masses felt. Normal bowel sounds heard. Central nervous system: Alert and oriented. No focal neurological deficits. Extremities: Left arm swelling Skin: No rashes, lesions or ulcers Psychiatry: Judgement and insight appear normal. Mood & affect appropriate.   Data Reviewed:  Reviewed all labs  Family Communication:   Disposition: Status is: Inpatient Remains inpatient appropriate because: Severity of disease, IV antibiotics and pending procedure  Planned Discharge Destination: Home    Time spent: 55 minutes, more than 50% of time spent on direct patient care.  Also performed clinicalKey research.  Author: Sharen Hones, MD 06/26/2021 10:09 AM  For on call review [email or web address].

## 2021-06-26 NOTE — Care Management (Signed)
  Transition of Care [facility]) Screening Note   Patient Details  Name: Shavon Zenz Date of Birth: April 20, 1985   Transition of Care [facility]) CM/SW Contact:    Caryn Section, RN Phone Number: 06/26/2021, 4:06 PM    Transition of Care Department [facility]) has reviewed patient and no TOC needs have been identified at this time. We will continue to monitor patient advancement through interdisciplinary progression rounds..    TOC will continue to monitor as patient is having a continuing medical workup

## 2021-06-26 NOTE — Progress Notes (Signed)
Patient to CT in stable condition via transport team.

## 2021-06-26 NOTE — Consult Note (Signed)
Referring Physician:  No referring provider defined for this encounter.  Primary Physician:  System, Provider Not In  Chief Complaint:  back/hip pain  History of Present Illness: 06/26/2021 Mason Russell is a 36 y.o. male who presents with the chief complaint of back pain and multiple joints with pain.  He has no radicular pain.  He came to ER for worsening lower back pain with a fever.  He was found to have possible SI joint septic arthritis prompting workup and consult for management recommendations.  Review of Systems:  A 10 point review of systems is negative, except for the pertinent positives and negatives detailed in the HPI.  Past Medical History: Past Medical History:  Diagnosis Date   Gout    Obesity with body mass index (BMI) of 30.0 to 39.9    Scoliosis     Past Surgical History: Past Surgical History:  Procedure Laterality Date   NO PAST SURGERIES      Allergies: Allergies as of 06/24/2021   (No Known Allergies)    Medications:  Current Facility-Administered Medications:    acetaminophen (TYLENOL) tablet 1,000 mg, 1,000 mg, Oral, Q6H PRN, Manuela Schwartz, NP, 1,000 mg at 06/25/21 2124   ceFEPIme (MAXIPIME) 2 g in sodium chloride 0.9 % 100 mL IVPB, 2 g, Intravenous, Q8H, Jaynie Bream, RPH, Last Rate: 200 mL/hr at 06/26/21 1645, 2 g at 06/26/21 1645   colchicine tablet 0.6 mg, 0.6 mg, Oral, BID, Lorretta Harp, MD, 0.6 mg at 06/26/21 3557   HYDROmorphone (DILAUDID) injection 1 mg, 1 mg, Intravenous, Q3H PRN, Lorretta Harp, MD, 1 mg at 06/24/21 1950   methocarbamol (ROBAXIN) tablet 500 mg, 500 mg, Oral, Q8H PRN, Lorretta Harp, MD, 500 mg at 06/26/21 0929   ondansetron (ZOFRAN) injection 4 mg, 4 mg, Intravenous, Q8H PRN, Lorretta Harp, MD   oxyCODONE-acetaminophen (PERCOCET/ROXICET) 5-325 MG per tablet 1 tablet, 1 tablet, Oral, Q4H PRN, Lorretta Harp, MD, 1 tablet at 06/26/21 0735   vancomycin (VANCOCIN) IVPB 1000 mg/200 mL premix, 1,000 mg, Intravenous, Q12H, Jaynie Bream, RPH, Last Rate: 200 mL/hr at 06/26/21 1412, 1,000 mg at 06/26/21 1412   Social History: Social History   Tobacco Use   Smoking status: Former    Types: Cigarettes   Smokeless tobacco: Never  Vaping Use   Vaping Use: Never used  Substance Use Topics   Alcohol use: Yes    Comment: occasionally   Drug use: Yes    Types: Marijuana    Family Medical History: Family History  Problem Relation Age of Onset   Cancer Mother     Physical Examination: Vitals:   06/26/21 1052 06/26/21 1651  BP: 111/77 127/86  Pulse: 84 88  Resp: 16 18  Temp: 98.1 F (36.7 C) 99.1 F (37.3 C)  SpO2: 96% 95%     General: Patient is well developed, well nourished, calm, collected, and in no apparent distress.  Psychiatric: Patient is non-anxious.  Head:  Pupils equal, round, and reactive to light.  ENT:  Oral mucosa appears well hydrated.  Neck:   Supple.  Full range of motion.  Respiratory: Patient is breathing without any difficulty.  Extremities: No edema.  Vascular: Palpable pulses in dorsal pedal vessels.  Skin:   On exposed skin, there are no abnormal skin lesions.  NEUROLOGICAL:  General: In no acute distress.   Awake, alert, oriented to person, place, and time.  Pupils equal round and reactive to light.  Facial tone is symmetric.  Tongue protrusion is  midline.  There is no pronator drift.   Strength: Side Biceps Triceps Deltoid Interossei Grip Wrist Ext. Wrist Flex.  R 5 5 5 5 5 5 5   L 5 5 5 5 5 5 5    Side Iliopsoas Quads Hamstring PF DF EHL  R 5 5 5 5 5 5   L 5 5 5 5 5 5     Bilateral upper and lower extremity sensation is intact to light touch. Reflexes are 1+ and symmetric at the biceps, triceps, brachioradialis, patella and achilles. Hoffman's is absent.  Clonus is not present.  Toes are down-going.    Gait is untested.  Imaging: MRI L spine 06/24/21 IMPRESSION: Mild lower lumbar disc and endplate degeneration at L4-L5 and L5-S1. No spinal  stenosis, but at the former broad-based disc bulge or protrusion with annular fissure and mild endplate spurring result in mild foraminal stenosis. Query right L4 radiculitis.     Electronically Signed   By: M.D.   On: 06/24/2021 07:40  MRI R hip 06/24/21 IMPRESSION: 1. Small right SI joint effusion with mild periarticular bone marrow edema as well as adjacent soft tissue edema with trace fluid. Findings are suspicious for septic arthritis given the patient's history. 2. Avascular necrosis of the bilateral femoral heads, left greater than right. No evidence of subarticular collapse. 3. No evidence of septic arthritis involving the bilateral hips.     Electronically Signed   By: 06/26/21 D.O.   On: 06/24/2021 08:04    I have personally reviewed the images and agree with the above interpretation.  Labs:    Latest Ref Rng & Units 06/25/2021    5:01 AM 06/24/2021    5:45 AM 05/13/2021    8:24 PM  CBC  WBC 4.0 - 10.5 K/uL 9.8  10.8  7.0   Hemoglobin 13.0 - 17.0 g/dL 06/26/2021  06/27/2021  06/26/2021   Hematocrit 39.0 - 52.0 % 39.1  43.4  41.2   Platelets 150 - 400 K/uL 394  446  268        Assessment and Plan: Mr. Mason Russell is a pleasant 36 y.o. male with mild arthritic changes in the spine. He has findings concerning for septic SI joint, as well as hip avascular necrosis reviewed in the MRI scan and orthopedic consult.  - Would not recommend washout - Recommend continued antibiotic treatment guided by medicine/ID - No need for bracing - No restrictions on weight bearing from my standpoint - No follow-up needed    Mason Russell K. 57.8 MD, MPHS Dept. of Neurosurgery

## 2021-06-26 NOTE — Procedures (Signed)
Vascular and Interventional Radiology Procedure Note  Patient: Mason Russell DOB: March 10, 1985 Medical Record Number: [id number] Note Date/Time: 06/26/21 1:00 PM   Performing Physician: Roanna Banning, MD Assistant(s): None  Diagnosis: Septic joint  Procedure: ASPIRATION OF RIGHT SACROILIAC JOINT  Anesthesia: Local Anesthetic Complications: None Estimated Blood Loss: Minimal Specimens: Sent for Gram Stain, Aerobe Culture, and Anerobe Culture  Findings:  Successful CT-guided aspiration of RIGHT sacroiliac joint.  See detailed procedure note with images in PACS. The patient tolerated the procedure well without incident or complication and was returned to Floor Bed in stable condition.    Roanna Banning, MD [facility]   Pager. [phone] Clinic. [phone]

## 2021-06-26 NOTE — Progress Notes (Signed)
   06/24/21 2034  Assess: MEWS Score  Temp (!) 101.7 F (38.7 C)  BP (!) 115/94  MAP (mmHg) 102  Pulse Rate (!) 129  SpO2 90 %  O2 Device Room Air  Assess: MEWS Score  MEWS Temp 2  MEWS Systolic 0  MEWS Pulse 2  MEWS RR 0  MEWS LOC 0  MEWS Score 4  MEWS Score Color Red  Assess: if the MEWS score is Yellow or Red  Were vital signs taken at a resting state? Yes  Focused Assessment No change from prior assessment  Does the patient meet 2 or more of the SIRS criteria? Yes  Does the patient have a confirmed or suspected source of infection? Yes  Provider and Rapid Response Notified? No  MEWS guidelines implemented *See Row Information* Yes  Treat  MEWS Interventions Administered prn meds/treatments (tylenol, O2 x 2L)  Pain Scale 0-10  Pain Score 7  Pain Type Acute pain  Pain Location Hip  Pain Orientation Right  Pain Frequency Constant  Pain Onset On-going  Take Vital Signs  Increase Vital Sign Frequency  Red: Q 1hr X 4 then Q 4hr X 4, if remains red, continue Q 4hrs  Escalate  MEWS: Escalate Red: discuss with charge nurse/RN and provider, consider discussing with RRT  Notify: Charge Nurse/RN  Name of Charge Nurse/RN Notified Phyllis  Date Charge Nurse/RN Notified 06/24/21  Time Charge Nurse/RN Notified 2037  Notify: Provider  Provider Name/Title Cliffton Asters  Date Provider Notified 06/24/21  Time Provider Notified 2044  Method of Notification Page  Notification Reason Change in status  Assess: SIRS CRITERIA  SIRS Temperature  1  SIRS Pulse 1  SIRS Respirations  0  SIRS WBC 0  SIRS Score Sum  2   Inserted for [employer]

## 2021-06-26 NOTE — Progress Notes (Signed)
Patient returned from CT in stable condition.

## 2021-06-26 NOTE — Consult Note (Signed)
NAME: Mason Russell  DOB: 1985-08-16  MRN: [id number]  Date/Time: 06/26/2021 6:01 PM  REQUESTING PROVIDER: Dr.Zhang Subjective:  REASON FOR CONSULT: SI joint septic arthritis ? Mason Russell is a 36 y.o. male with a history of Gout with recurrent arthritis , fpresents with pain rt hip and back Pt has had left knee gouty arthitis for many months and he is shifting his weight to the rt because of pain left knee- This caused him to have pain in his rt hip he thinks He also had fever for a few days. 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 He was diagnosed with rt foot tendinits and given meloxicam and  asked to do RICE  On 07/09/18 he went to ED and c/p left knee pain and ultrasound confirmed bakers cyst and no DVT. On 07/17/18 saw ortho Dr.Patel at [facility]- MRI was done and it showed moderate joint effusion.on 08/20/21 he went to ED with acute knee pain he had aspiration of the joint and it showed monosodium urate crystals. Started on colchicine and prednisone He was again seen in [facility] ED for left knee pain and in 06/11/20 for rt hip pain in urgent care and was given another course of prednisone/ flexeril and narco He came to the ED twice in May 2023 and was diagnosed with rt plantar fascitis. He then pesented to the ED in June 17. With pain back and rt hip  Born in [place]- in [place] for the past few years No pets No tick bites  Past Medical History:  Diagnosis Date   Gout    Obesity with body mass index (BMI) of 30.0 to 39.9     Scoliosis     Past Surgical History:  Procedure Laterality Date   NO PAST SURGERIES      Social History   Socioeconomic History   Marital status: Single    Spouse name: Not on file   Number of children: Not on file   Years of education: Not on file   Highest education level: Not on file  Occupational History   Not on file  Tobacco Use   Smoking status: Former    Types: Cigarettes   Smokeless tobacco: Never  Vaping Use   Vaping Use: Never used  Substance and Sexual Activity   Alcohol use: Yes    Comment: occasionally   Drug use: Yes    Types: Marijuana   Sexual activity: Not on file  Other Topics Concern   Not on file  Social History Narrative   Not on file   Social Determinants of Health   Financial Resource Strain: Not on file  Food Insecurity: Not on file  Transportation Needs: Not on file  Physical Activity: Not on file  Stress: Not on file  Social Connections: Not on file  Intimate Partner Violence: Not on file    Family History  Problem Relation Age of Onset   Cancer Mother    No Known  Allergies I? Current Facility-Administered Medications  Medication Dose Route Frequency Provider Last Rate Last Admin   acetaminophen (TYLENOL) tablet 1,000 mg  1,000 mg Oral Q6H PRN Sharion Settler, NP   1,000 mg at 06/25/21 2124   ceFEPIme (MAXIPIME) 2 g in sodium chloride 0.9 % 100 mL IVPB  2 g Intravenous Q8H Mickeal Skinner A, RPH 200 mL/hr at 06/26/21 1645 2 g at 06/26/21 1645   colchicine tablet 0.6 mg  0.6 mg Oral BID Ivor Costa, MD   0.6 mg at 06/26/21 3845   HYDROmorphone (DILAUDID) injection 1 mg  1 mg Intravenous Q3H PRN Ivor Costa, MD   1 mg at 06/24/21 1950   methocarbamol (ROBAXIN) tablet 500 mg  500 mg Oral Q8H PRN Ivor Costa, MD   500 mg at 06/26/21 0929   ondansetron (ZOFRAN) injection 4 mg  4 mg Intravenous Q8H PRN Ivor Costa, MD       oxyCODONE-acetaminophen (PERCOCET/ROXICET) 5-325 MG per tablet 1 tablet  1 tablet Oral Q4H PRN Ivor Costa, MD   1  tablet at 06/26/21 0735   vancomycin (VANCOCIN) IVPB 1000 mg/200 mL premix  1,000 mg Intravenous Q12H Wynelle Cleveland, RPH 200 mL/hr at 06/26/21 1412 1,000 mg at 06/26/21 1412     Abtx:  Anti-infectives (From admission, onward)    Start     Dose/Rate Route Frequency Ordered Stop   06/25/21 0100  vancomycin (VANCOCIN) IVPB 1000 mg/200 mL premix        1,000 mg 200 mL/hr over 60 Minutes Intravenous Every 12 hours 06/24/21 1249     06/24/21 2200  vancomycin (VANCOCIN) IVPB 1000 mg/200 mL premix  Status:  Discontinued        1,000 mg 200 mL/hr over 60 Minutes Intravenous Every 12 hours 06/24/21 0940 06/24/21 1249   06/24/21 0945  ceFEPIme (MAXIPIME) 2 g in sodium chloride 0.9 % 100 mL IVPB        2 g 200 mL/hr over 30 Minutes Intravenous Every 8 hours 06/24/21 0940     06/24/21 0930  vancomycin (VANCOREADY) IVPB 1500 mg/300 mL        1,500 mg 150 mL/hr over 120 Minutes Intravenous  Once 06/24/21 0901 06/24/21 1445   06/24/21 0915  cefTRIAXone (ROCEPHIN) 2 g in sodium chloride 0.9 % 100 mL IVPB  Status:  Discontinued        2 g 200 mL/hr over 30 Minutes Intravenous  Once 06/24/21 0901 06/24/21 0918       REVIEW OF SYSTEMS:  Const:  fever, negative chills, negative weight loss Eyes: negative diplopia or visual changes, negative eye pain ENT: negative coryza, negative sore throat Resp: negative cough, hemoptysis, dyspnea Cards: negative for chest pain, palpitations, lower extremity edema GU: negative for frequency, dysuria and hematuria GI: Negative for abdominal pain, diarrhea, bleeding, constipation Skin: negative for rash and pruritus Heme: negative for easy bruising and gum/nose bleeding MS: pain in multiple joints and back Neurolo:negative for headaches, dizziness, vertigo, memory problems  Psych: negative for feelings of anxiety, depression  Endocrine: negative for thyroid, diabetes Allergy/Immunology- negative for any medication or food allergies  Objective:  VITALS:   BP 127/86 (BP Location: Left Arm)   Pulse 88   Temp 99.1 F (37.3 C)   Resp 18   Ht '5\' 2"'  (1.575 m)   Wt 74.8 kg   SpO2 95%   BMI 30.18 kg/m   PHYSICAL EXAM:  General: Alert, cooperative, no distress, appears stated age.  Head: Normocephalic, without obvious abnormality,  atraumatic. Eyes: Conjunctivae clear, anicteric sclerae. Pupils are equal ENT Nares normal. No drainage or sinus tenderness. Lips, mucosa, and tongue normal. No Thrush Neck: Supple, symmetrical, no adenopathy, thyroid: non tender no carotid bruit and no JVD. Back: No CVA tenderness. Lungs: Clear to auscultation bilaterally. No Wheezing or Rhonchi. No rales. Heart: Regular rate and rhythm, no murmur, rub or gallop. Abdomen: Soft, non-tender,not distended. Bowel sounds normal. No masses Extremities: pain left knee, rt hip rt back Skin: No rashes or lesions. Or bruising Lymph: Cervical, supraclavicular normal. Neurologic: Grossly non-focal Pertinent Labs Lab Results CBC    Component Value Date/Time   WBC 9.8 06/25/2021 0501   RBC 4.25 06/25/2021 0501   HGB 12.7 (L) 06/25/2021 0501   HCT 39.1 06/25/2021 0501   PLT 394 06/25/2021 0501   MCV 92.0 06/25/2021 0501   MCH 29.9 06/25/2021 0501   MCHC 32.5 06/25/2021 0501   RDW 13.0 06/25/2021 0501   LYMPHSABS 1.5 06/24/2021 0545   MONOABS 1.2 (H) 06/24/2021 0545   EOSABS 0.1 06/24/2021 0545   BASOSABS 0.0 06/24/2021 0545       Latest Ref Rng & Units 06/25/2021    5:01 AM 06/24/2021    5:45 AM 05/13/2021    8:24 PM  CMP  Glucose 70 - 99 mg/dL 123  145  107   BUN 6 - 20 mg/dL '9  13  8   ' Creatinine 0.61 - 1.24 mg/dL 0.61  0.68  0.66   Sodium 135 - 145 mmol/L 136  141  141   Potassium 3.5 - 5.1 mmol/L 3.7  3.3  3.4   Chloride 98 - 111 mmol/L 106  106  108   CO2 22 - 32 mmol/L '23  24  22   ' Calcium 8.9 - 10.3 mg/dL 8.9  9.6  8.9   Total Protein 6.5 - 8.1 g/dL  8.9  7.8   Total Bilirubin 0.3 - 1.2 mg/dL  1.3  0.7   Alkaline Phos 38 - 126 U/L  69  71   AST  15 - 41 U/L  31  102   ALT 0 - 44 U/L  34  104       Microbiology: Recent Results (from the past 240 hour(s))  Urine Culture     Status: Abnormal   Collection Time: 06/24/21  5:45 AM   Specimen: Urine, Clean Catch  Result Value Ref Range Status   Specimen Description   Final    URINE, CLEAN CATCH Performed at [facility], [address], [place], [place] [address]    Special Requests   Final    NONE Performed at [facility], [address] [place] [address] [place], [place] [address]    Culture (A)  Final    <10,000 COLONIES/mL INSIGNIFICANT GROWTH Performed at [facility], [place] [address], [place], [place] [address]    Report Status 06/25/2021 FINAL  Final  Blood culture (routine x 2)     Status: None (Preliminary result)   Collection Time: 06/24/21  5:45 AM   Specimen: BLOOD  Result Value Ref Range Status   Specimen Description BLOOD RIGHT HAND  Final   Special Requests   Final    BOTTLES DRAWN AEROBIC AND ANAEROBIC Blood Culture results may not be optimal due to an excessive volume of blood received in culture bottles   Culture   Final    NO GROWTH 1 DAY Performed at [facility], [address] [place], [place] [address]  Report Status PENDING  Incomplete  Blood culture (routine x 2)     Status: None (Preliminary result)   Collection Time: 06/24/21  5:45 AM   Specimen: BLOOD  Result Value Ref Range Status   Specimen Description BLOOD LEFT AC  Final   Special Requests   Final    BOTTLES DRAWN AEROBIC AND ANAEROBIC Blood Culture results may not be optimal due to an excessive volume of blood received in culture bottles   Culture   Final    NO GROWTH 1 DAY Performed at [facility], [address], [place], [place] [address]    Report Status PENDING  Incomplete  SARS Coronavirus 2 by RT PCR (hospital order, performed in [facility] hospital lab) *cepheid single result test* Anterior Nasal Swab     Status: None   Collection Time:  06/24/21  5:45 AM   Specimen: Anterior Nasal Swab  Result Value Ref Range Status   SARS Coronavirus 2 by RT PCR NEGATIVE NEGATIVE Final    Comment: (NOTE) SARS-CoV-2 target nucleic acids are NOT DETECTED.  The SARS-CoV-2 RNA is generally detectable in upper and lower respiratory specimens during the acute phase of infection. The lowest concentration of SARS-CoV-2 viral copies this assay can detect is 250 copies / mL. A negative result does not preclude SARS-CoV-2 infection and should not be used as the sole basis for treatment or other patient management decisions.  A negative result may occur with improper specimen collection / handling, submission of specimen other than nasopharyngeal swab, presence of viral mutation(s) within the areas targeted by this assay, and inadequate number of viral copies (<250 copies / mL). A negative result must be combined with clinical observations, patient history, and epidemiological information.  Fact Sheet for Patients:   https://www.patel.info/  Fact Sheet for Healthcare Providers: https://hall.com/  This test is not yet approved or  cleared by the [place] FDA and has been authorized for detection and/or diagnosis of SARS-CoV-2 by FDA under an Emergency Use Authorization (EUA).  This EUA will remain in effect (meaning this test can be used) for the duration of the COVID-19 declaration under Section 564(b)(1) of the Act, 21 U.S.C. section 360bbb-3(b)(1), unless the authorization is terminated or revoked sooner.  Performed at [facility], [address] [place], [place] [address]     IMAGING RESULTS: I have personally reviewed the films ? Impression/Recommendation Pt admitted with multiple joint pain and especially rt hip and rt SI joint pain H/o gouty arthritis left knee in the past  Reactive arthritis VS seronegative arthritis- doubt this is infection Only 1 cc of fluid was  aspirated from SI joint Will check GC/CHL/Lyme, brucella, bartonella  ( HIV RPR, HEPC neg) Currently on vanco and cefepime- will discontinue both and observe ESR 83? CRP 19.6  ?   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

## 2021-06-27 LAB — CREATININE, SERUM
Creatinine, Ser: 0.58 mg/dL — ABNORMAL LOW (ref 0.61–1.24)
GFR, Estimated: >60 mL/min (ref >60)

## 2021-06-27 LAB — HEPATITIS C ANTIBODY: HCV Ab: NONREACTIVE

## 2021-06-27 LAB — GLUCOSE, CAPILLARY
Glucose-Capillary: 122 mg/dL — ABNORMAL HIGH (ref 70–99)
Glucose-Capillary: 117 mg/dL — ABNORMAL HIGH (ref 70–99)

## 2021-06-27 LAB — URIC ACID: Uric Acid, Serum: 6.1 mg/dL (ref 3.7–8.6)

## 2021-06-27 LAB — SEDIMENTATION RATE: Sed Rate: 83 mm/hr — ABNORMAL HIGH (ref 0–15)

## 2021-06-27 MED ORDER — PREDNISONE 20 MG PO TABS
40.0000 mg | ORAL_TABLET | Freq: Every day | ORAL | Status: DC
  Administered 2021-06-27 – 2021-06-30 (×4): 40 mg via ORAL
  Filled 2021-06-27 (×4): qty 2

## 2021-06-27 MED ORDER — ENOXAPARIN SODIUM 40 MG/0.4ML IJ SOSY
40.0000 mg | PREFILLED_SYRINGE | INTRAMUSCULAR | Status: DC
  Administered 2021-06-28 – 2021-06-29 (×2): 40 mg via SUBCUTANEOUS
  Filled 2021-06-27 (×2): qty 0.4

## 2021-06-27 NOTE — Evaluation (Signed)
Occupational Therapy Evaluation Patient Details Name: Mason Russell MRN: [id number] DOB: 05-07-1985 Today's Date: 06/27/2021   History of Present Illness Mason Russell is a 36 y.o. male with medical history significant of gout, obesity with BMI 30.18, scoliosis, who presents with lower back pain, multiple joint pain including bilateral knees, left shoulder, and left elbow. Patient states that his lower back pain and multiple joint pain started 3 days ago. It is constant, severe, 10 out of 10 in severity.  No injury. His lower back pain does not radiate into the legs, no leg numbness or weakness. No urinary incontinence or loss control of bowel movement. Patient has fever 101.3 in the ED, no chills.  Denies chest pain, cough, shortness breath. No nausea, vomiting, diarrhea, or abdominal pain. No symptoms of UTI. No rashes.   Clinical Impression   Mason Russell presents with generalized weakness, reduced endurance, impaired balance, limited LE ROM, and 8/10 pain. Prior to admission, pt was largely IND in ADL and IADL -- working, driving, active in community -- but occasionally using crutches with pain flair-ups and sometimes requiring assistance with bending/lifting. He denies falls history. During today's evaluation, pt requires Mod/Max A for bed mobility, sit<>stand transfers, and for maintaining standing balance. He is unable to transfer bed<>recliner, requires assistance for repositioning LLE, repeatedly expresses fear of falling, and becomes fatigued with < 4 minutes of standing with RW. Pt left sitting EOB, with alarm bell within reach and instructed to alert nursing staff when he is ready to return to supine, but with encouragement to maintain sitting balance for 30+ minutes. Pt verbalizes understanding. Will continue to offer OT during hospitalization to improve endurance, balance, fxl mobility, and pain mgmt strategies. Given acute exacerbation of condition, as well as pt's young age, previous  high level of fxl mobility, and motivation to return to PLOF, recommend CIR upon DC.    Recommendations for follow up therapy are one component of a multi-disciplinary discharge planning process, led by the attending physician.  Recommendations may be updated based on patient status, additional functional criteria and insurance authorization.   Follow Up Recommendations  Acute inpatient rehab (3hours/day)    Assistance Recommended at Discharge Intermittent Supervision/Assistance  Patient can return home with the following A lot of help with walking and/or transfers;A lot of help with bathing/dressing/bathroom;Help with stairs or ramp for entrance;Assistance with cooking/housework    Functional Status Assessment  Patient has had a recent decline in their functional status and demonstrates the ability to make significant improvements in function in a reasonable and predictable amount of time.  Equipment Recommendations  Other (comment) (RW)    Recommendations for Other Services       Precautions / Restrictions Precautions Precautions: Fall Precaution Comments: mod fall risk Restrictions Weight Bearing Restrictions: No      Mobility Bed Mobility Overal bed mobility: Needs Assistance Bed Mobility: Supine to Sit     Supine to sit: Mod assist     General bed mobility comments: Mod A for repositioning LE, with greater assistance needed on L    Transfers Overall transfer level: Needs assistance Equipment used: Rolling walker (2 wheels) Transfers: Sit to/from Stand, Bed to chair/wheelchair/BSC Sit to Stand: Mod assist, +2 physical assistance           General transfer comment: Attempted bed to recliner transfer, but pt unable.      Balance Overall balance assessment: Needs assistance Sitting-balance support: Feet supported, No upper extremity supported Sitting balance-Leahy Scale: Good     Standing  balance support: Bilateral upper extremity supported Standing  balance-Leahy Scale: Poor Standing balance comment: Pt able to maintain standing balance for ~ 3 minutes, with Mod A +2 and with assistance for reposition L knee for sit<stand transfer                           ADL either performed or assessed with clinical judgement   ADL Overall ADL's : Needs assistance/impaired                         Toilet Transfer: Maximal assistance Toilet Transfer Details (indicate cue type and reason): Pt uses urinal INDly, requests bed pan for BM                 Vision Patient Visual Report: No change from baseline       Perception     Praxis      Pertinent Vitals/Pain Pain Assessment Pain Assessment: 0-10 Pain Score: 8  Pain Descriptors / Indicators: Throbbing, Grimacing, Moaning, Aching, Cramping Pain Intervention(s): Limited activity within patient's tolerance, Monitored during session, Repositioned, Ice applied     Hand Dominance     Extremity/Trunk Assessment Upper Extremity Assessment Upper Extremity Assessment: Overall WFL for tasks assessed   Lower Extremity Assessment Lower Extremity Assessment: Overall WFL for tasks assessed;LLE deficits/detail LLE Deficits / Details: knee pain, reduced ROM, tremors in lower thigh LLE Sensation: WNL       Communication Communication Communication: No difficulties   Cognition Arousal/Alertness: Awake/alert Behavior During Therapy: WFL for tasks assessed/performed Overall Cognitive Status: Within Functional Limits for tasks assessed                                       General Comments       Exercises Other Exercises Other Exercises: Educ re: falls prevention, PoC, importance of OOB activity, safe use of DME   Shoulder Instructions      Home Living Family/patient expects to be discharged to:: Private residence Living Arrangements: Parent;Other relatives Available Help at Discharge: Family;Available 24 hours/day Type of Home: Apartment Home  Access: Level entry     Home Layout: One level     Bathroom Shower/Tub: Chief Strategy Officer: Standard     Home Equipment: Crutches          Prior Functioning/Environment Prior Level of Function : Independent/Modified Independent             Mobility Comments: Pt has been working at [employer], driving, using crutches occasionally when has joint pain. ADLs Comments: IND in ADL/IADL        OT Problem List: Impaired balance (sitting and/or standing);Pain;Decreased activity tolerance;Decreased range of motion;Decreased strength      OT Treatment/Interventions: Self-care/ADL training;Patient/family education;Balance training;DME and/or AE instruction;Therapeutic activities;Energy conservation;Therapeutic exercise    OT Goals(Current goals can be found in the care plan section) Acute Rehab OT Goals Patient Stated Goal: to be able to get out to [place] soon OT Goal Formulation: With patient Time For Goal Achievement: 07/11/21 Potential to Achieve Goals: Good ADL Goals Pt Will Perform Lower Body Dressing: Independently Pt Will Transfer to Toilet: Independently Pt Will Perform Toileting - Clothing Manipulation and hygiene: Independently  OT Frequency: Min 2X/week    Co-evaluation PT/OT/SLP Co-Evaluation/Treatment: Yes Reason for Co-Treatment: Complexity of the patient's impairments (multi-system involvement);For patient/therapist safety;To address functional/ADL transfers  PT goals addressed during session: Mobility/safety with mobility;Proper use of DME;Balance OT goals addressed during session: ADL's and self-care;Strengthening/ROM      AM-PAC OT "6 Clicks" Daily Activity     Outcome Measure Help from another person eating meals?: None Help from another person taking care of personal grooming?: A Little Help from another person toileting, which includes using toliet, bedpan, or urinal?: A Lot Help from another person bathing (including washing, rinsing,  drying)?: A Little Help from another person to put on and taking off regular upper body clothing?: A Little Help from another person to put on and taking off regular lower body clothing?: A Lot 6 Click Score: 17   End of Session Equipment Utilized During Treatment: Rolling walker (2 wheels);Gait belt  Activity Tolerance: Patient tolerated treatment well;Patient limited by pain Patient left: in bed;with call bell/phone within reach;Other (comment) (sitting on EOB)  OT Visit Diagnosis: Unsteadiness on feet (R26.81);Muscle weakness (generalized) (M62.81)                Time: 2542-7062 OT Time Calculation (min): 29 min Charges:  OT General Charges $OT Visit: 1 Visit OT Evaluation $OT Eval Moderate Complexity: 1 Mod OT Treatments $Self Care/Home Management : 8-22 mins Latina Craver, PhD, MS, OTR/L 06/27/21, 4:21 PM

## 2021-06-27 NOTE — Consult Note (Signed)
Rheumatology Consult Note  HPI  Reason for Consult: Inflammatory Arthritis  Chief Complaint  Patient presents with   Back Pain     I have been asked to see this patient in consultation by Dr.Zhang. I have reviewed the medical records from Dr.Zhang. Mason Russell is a 36 y.o. male is here today for evaluation of inflammatory arthritis who has medical history of gout. He also has remote history of IV drug use as a teenager nothing recent. He came to the ER due to lower back pain, knees, left shoulder and elbow pains.   He states that he has history of joint pains for several years. He has history of swelling of multiple joints for several years including his mcp joints. He has history of left knee aspiration that showed gout in year 2020. The patient does not recall this.   He denies any sexually transmitted disease. He did undergo MRI right hip that was concerning for septic arthritis of the SI joint. He underwent joint aspiration that was sent for gram stain and culture.   The right SI joint aspirate so far does not show any growth.   He has no history of psoriasis, Crohn's or Ulcerative colitis. There is no family history of rheumatoid arthritis or lupus.  ______________________________________________________________________    Current Facility-Administered Medications:    acetaminophen (TYLENOL) tablet 1,000 mg, 1,000 mg, Oral, Q6H PRN, Morrison, Brenda, NP, 1,000 mg at 06/27/21 0748   ceFEPIme (MAXIPIME) 2 g in sodium chloride 0.9 % 100 mL IVPB, 2 g, Intravenous, Q8H, Childs, Caroline A, RPH, Last Rate: 200 mL/hr at 06/27/21 1430, 2 g at 06/27/21 1430   colchicine tablet 0.6 mg, 0.6 mg, Oral, BID, Niu, Xilin, MD, 0.6 mg at 06/27/21 0944   enoxaparin (LOVENOX) injection 40 mg, 40 mg, Subcutaneous, Q24H, Moore, Anderson S, RPH   HYDROmorphone (DILAUDID) injection 1 mg, 1 mg, Intravenous, Q3H PRN, Niu, Xilin, MD, 1 mg at 06/24/21 1950   methocarbamol (ROBAXIN) tablet 500 mg, 500  mg, Oral, Q8H PRN, Niu, Xilin, MD, 500 mg at 06/27/21 0748   ondansetron (ZOFRAN) injection 4 mg, 4 mg, Intravenous, Q8H PRN, Niu, Xilin, MD   oxyCODONE-acetaminophen (PERCOCET/ROXICET) 5-325 MG per tablet 1 tablet, 1 tablet, Oral, Q4H PRN, Niu, Xilin, MD, 1 tablet at 06/26/21 0735   vancomycin (VANCOCIN) IVPB 1000 mg/200 mL premix, 1,000 mg, Intravenous, Q12H, Childs, Caroline A, RPH, Last Rate: 200 mL/hr at 06/27/21 1530, 1,000 mg at 06/27/21 1530  No Known Allergies  Past Medical History:  Diagnosis Date   Gout    Obesity with body mass index (BMI) of 30.0 to 39.9    Scoliosis     Past Surgical History:  Procedure Laterality Date   NO PAST SURGERIES      Family History  Problem Relation Age of Onset   Cancer Mother     Social History   Tobacco Use   Smoking status: Former    Types: Cigarettes   Smokeless tobacco: Never  Substance Use Topics   Alcohol use: Yes    Comment: occasionally    ______________________________________________________________________  Review of Systems:  Review of Systems  Constitutional:  Positive for fever.  HENT:  Negative for mouth sores.   Eyes:  Negative for pain and visual disturbance.  Respiratory:  Negative for shortness of breath.   Cardiovascular:  Positive for leg swelling.  Gastrointestinal:  Negative for constipation, diarrhea and nausea.  Genitourinary:  Negative for dysuria and hematuria.  Musculoskeletal:          Per HPI  Skin:  Negative for color change and rash.  Neurological:  Negative for dizziness and headaches.  Hematological:  Negative for adenopathy.  Psychiatric/Behavioral:  Negative for sleep disturbance.     Objective:  Today's Vitals   06/27/21 0748 06/27/21 0754 06/27/21 1100 06/27/21 1605  BP:  106/74 113/75 102/84  Pulse:  84 80 93  Resp:  16 16 17  Temp:  98.2 F (36.8 C) 98.1 F (36.7 C) 98.1 F (36.7 C)  TempSrc:  Oral Oral   SpO2:  93% 94% 95%  Weight:      Height:      PainSc: 5   5      Body mass index is 30.18 kg/m.   Length of Stiffness: Several Hours  GEN - Pleasant, No Apparent Distress  HEENT - normocephalic and atraumatic. Conjunctiva Clear.  No Nasal/Oral Ulcer. Adequate Saliva Neck - supple with no adenopathy or thyromegaly.   C spine with full range of motion. Heart - regular rate and rhythm, No murmurs/gallops/rub, Nml S1S2 Lungs - clear to auscultation in all fields. Extremities - there is no cyanosis or edema. Neurological - alert and oriented.  Spine - no paraspinal tenderness; Lumbar Spine and SI Joint Tenderness Skin - no rashes observed MSK - The following joints were examined bilaterally: Hands, Wrists, Elbows, Shoulders, Metatarsals, Ankels, Knees and Hips; they were normal apart from what is noted.   100% Fist Formation Both Wrist S1T1 Left MCP 2-3, 5th S1T1; PIP 3rd S1T1 Left Elbow with tenderness, pain on extension Left Knee With Mild Effusion, Pain on Extension Right Patella Tenderness No Dactylitis ______________________________________________________________________ Labs/Imaging Reviewed in EMR  Cr 0.58, AST 102-->31; ALT 104 --> 34 CBC Plt 446 (H) Uric Acid 6.1 ESR 83 (H)  Neg: HIV, Hep C, RPR  --Left Knee Synovial Fluid (08/2018)-- Positive Monosodium Crystals   MRI Right Hip 1. Small right SI joint effusion with mild periarticular bone marrow edema as well as adjacent soft tissue edema with trace fluid. Findings are suspicious for septic arthritis given the patient's history. 2. Avascular necrosis of the bilateral femoral heads, left greater than right. No evidence of subarticular collapse. 3. No evidence of septic arthritis involving the bilateral hips.  MRI Lumbar Spine Mild lower lumbar disc and endplate degeneration at L4-L5 and L5-S1. No spinal stenosis, but at the former broad-based disc bulge or protrusion with annular fissure and mild endplate spurring result in mild foraminal stenosis. Query right L4  radiculitis.  Assessment and Plan    Inflammatory arthritis -- This seems less infectious. This seems more like a spondyloarthritis.  -- If infectious disease approves, would recommend starting Ibuprofen daily.  -- I also recommend starting Prednisone 40 mg daily.  -- Will check labs  2. History of gout -- Uric acid may be falsely low -- Prednisone should help   I appreciate the opportunity to participate in the care of Mason Russell. Please do not hesitate to contact me with any questions or concerns that may arise in regards to the patient's rheumatologic disease.  

## 2021-06-27 NOTE — Progress Notes (Signed)
  Progress Note   Patient: Mason Russell MBT:[id number] DOB: 05-23-1985 DOA: 06/24/2021     3 DOS: the patient was seen and examined on 06/27/2021   Brief hospital course: Mason Russell is a 36 y.o. 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  Patient has been having joint pain for the last 3 years, he was using crutches for the last 2 years due to pain and weakness After arriving in the hospital, patient had a fever up to 101.3, heart rate 112, x-rays of shoulder, elbow knees and hips did not show any bony involvement, with some joint effusion.  MRI of the right hip showed right SI joint effusion with mild periarticular bone marrow edema concerning for septic arthritis.  Patient is placed on antibiotics with vancomycin and cefepime, ordered SI joint aspiration per IR.  Patient has significant multijoint pain and effusion for 2 to 3 years, probably has autoimmune disease.  Rheumatology consult is ordered.  Assessment and Plan: Sepsis. Possible septic arthritis of sacroiliac joint. Patient had about 2 to 3 years of symptoms of joint pain and weakness, joint effusion in multiple sites.  Patient has not using IV drugs for about 20 years. Patient has elevated CRP, ESR; send out  antinuclear antibody, rheumatoid factor, antiphospholipid panel, hepatitis C.   HIV negative. Patient had SI joint aspiration on 6/19.  Gram stain and culture sent out, I discussed with lab, there was not enough sample to perform cell count.  Continue antibiotics for now under the guidance of ID. I have called to [facility] rheumatology, unanswered after 20 minutes.  I have left a message to Dr. Serita Grit phone number listed, awaiting response.  Obesity with BMI  30.2. Diet and exercise.   Hypokalemia. Improved  Generalized weakness. PT/OT      Subjective:  Patient still complaining of joint pain and weakness.  No fever.  Physical Exam: Vitals:   06/26/21 1651 06/26/21 2054 06/27/21 0504 06/27/21 0754  BP: 127/86 120/78 108/77 106/74  Pulse: 88 96 80 84  Resp: _0 Temp: 99.1 F (37.3 C) 99.5 F (37.5 C) 98.5 F (36.9 C) 98.2 F (36.8 C)  TempSrc:    Oral  SpO2: 95% 95% 93% 93%  Weight:      Height:       General exam: Appears calm and comfortable  Respiratory system: Clear to auscultation. Respiratory effort normal. Cardiovascular system: S1 & S2 heard, RRR. No JVD, murmurs, rubs, gallops or clicks. No pedal edema. Gastrointestinal system: Abdomen is nondistended, soft and nontender. No organomegaly or masses felt. Normal bowel sounds heard. Central nervous system: Alert and oriented. No focal neurological deficits. Extremities: Symmetric 5 x 5 power. Skin: No rashes, lesions or ulcers Psychiatry: Judgement and insight appear normal. Mood & affect appropriate.   Data Reviewed:  All lab results reviewed  Family Communication:   Disposition: Status is: Inpatient Remains inpatient appropriate because: Severity of disease, IV antibiotics.  Planned Discharge Destination: Home with Home Health    Time spent: 50 minutes, more than 50% time involving direct patient care  Author: Sharen Hones, MD 06/27/2021 10:22 AM  For on call review [email or web address].

## 2021-06-27 NOTE — Evaluation (Signed)
Physical Therapy Evaluation Patient Details Name: Mason Russell MRN: [id number] DOB: February 28, 1985 Today's Date: 06/27/2021  History of Present Illness  Mason Russell is a 35yoM who comes to [facility] with severe progression in back/leg pain, no longer able to walk at home. workup revelaing for septic Rt SIJ. Pt consulted on by neurosurgical, rheumatology, and orthopedics. Pt has pain in multiple joints in body.  Clinical Impression  Pt admitted with above diagnosis. Pt currently with functional limitations due to the deficits listed below (see "PT Problem List"). Upon entry, pt in bed, awake and agreeable to participate. The pt is alert, pleasant, interactive, and able to provide info regarding prior level of function, both in tolerance and independence. Pt's pain and multijoint stiffness remain heightened making basic movements very challenging. He is able to rise to standing position after several minutes of technique negotiation, near-maximal effort, and physical assistance. He is unable to safely attempt taking any steps. Patient's performance this date reveals decreased ability, independence, and tolerance in performing all basic mobility required for performance of activities of daily living. Pt requires additional DME, close physical assistance, and cues for safe participate in mobility. Pt will benefit from skilled PT intervention to increase independence and safety with basic mobility in preparation for discharge to the venue listed below.         Recommendations for follow up therapy are one component of a multi-disciplinary discharge planning process, led by the attending physician.  Recommendations may be updated based on patient status, additional functional criteria and insurance authorization.  Follow Up Recommendations Skilled nursing-short term rehab (<3 hours/day) Can patient physically be transported by private vehicle: No    Assistance Recommended at Discharge Intermittent  Supervision/Assistance  Patient can return home with the following  Two people to help with walking and/or transfers;A lot of help with bathing/dressing/bathroom;Assistance with cooking/housework;Help with stairs or ramp for entrance;Assist for transportation    Equipment Recommendations  (defer to next venue)  Recommendations for Other Services       Functional Status Assessment Patient has had a recent decline in their functional status and demonstrates the ability to make significant improvements in function in a reasonable and predictable amount of time.     Precautions / Restrictions Precautions Precautions: Fall Precaution Comments: mod fall risk Restrictions Weight Bearing Restrictions: No      Mobility  Bed Mobility Overal bed mobility: Needs Assistance Bed Mobility: Supine to Sit     Supine to sit: Mod assist     General bed mobility comments: Mod A for repositioning LE, with greater assistance needed on L    Transfers Overall transfer level: Needs assistance Equipment used: Rolling walker (2 wheels)   Sit to Stand: Mod assist, +2 physical assistance           General transfer comment: tolerates standing at bedside for ~60sec despite severe pain. Pt unable to reduce wider-than-RW stance; unsafe hand placement on RW tomaximize ergonomics    Ambulation/Gait Ambulation/Gait assistance:  (pt unable to attempt due to severe weakness,/pain, falls anxiety)                Stairs            Wheelchair Mobility    Modified Rankin (Stroke Patients Only)       Balance  Pertinent Vitals/Pain      Home Living Family/patient expects to be discharged to:: Private residence Living Arrangements: Parent;Other relatives Available Help at Discharge: Family;Available 24 hours/day Type of Home: Apartment Home Access: Level entry       Home Layout: One level Home Equipment: Crutches       Prior Function Prior Level of Function : Independent/Modified Independent             Mobility Comments: Pt has been working at [employer], driving, using crutches occassionally when has joint pain. ADLs Comments: IND in ADL/IADL     Hand Dominance        Extremity/Trunk Assessment   Upper Extremity Assessment Upper Extremity Assessment: Overall WFL for tasks assessed    Lower Extremity Assessment Lower Extremity Assessment: Overall WFL for tasks assessed;LLE deficits/detail LLE Deficits / Details: knee pain, reduced ROM, tremors in lower thigh LLE Sensation: WNL       Communication   Communication: No difficulties  Cognition                                                General Comments      Exercises     Assessment/Plan    PT Assessment Patient needs continued PT services  PT Problem List Decreased strength;Decreased range of motion;Decreased activity tolerance;Decreased balance;Decreased mobility;Decreased knowledge of precautions;Decreased safety awareness;Decreased knowledge of use of DME;Decreased cognition;Decreased coordination       PT Treatment Interventions DME instruction;Gait training;Therapeutic exercise;Balance training;Stair training;Functional mobility training;Therapeutic activities;Patient/family education    PT Goals (Current goals can be found in the Care Plan section)  Acute Rehab PT Goals Patient Stated Goal: regain basic mobility PT Goal Formulation: With patient Time For Goal Achievement: 07/11/21 Potential to Achieve Goals: Good    Frequency Min 2X/week     Co-evaluation PT/OT/SLP Co-Evaluation/Treatment: Yes Reason for Co-Treatment: For patient/therapist safety PT goals addressed during session: Mobility/safety with mobility;Proper use of DME;Balance OT goals addressed during session: ADL's and self-care;Strengthening/ROM       AM-PAC PT "6 Clicks" Mobility  Outcome Measure Help needed turning from  your back to your side while in a flat bed without using bedrails?: Total Help needed moving from lying on your back to sitting on the side of a flat bed without using bedrails?: Total Help needed moving to and from a bed to a chair (including a wheelchair)?: A Lot Help needed standing up from a chair using your arms (e.g., wheelchair or bedside chair)?: A Lot Help needed to walk in hospital room?: Total Help needed climbing 3-5 steps with a railing? : Total 6 Click Score: 8    End of Session Equipment Utilized During Treatment: Gait belt Activity Tolerance: Patient limited by pain Patient left: in bed;with call bell/phone within reach   PT Visit Diagnosis: Unsteadiness on feet (R26.81);Other abnormalities of gait and mobility (R26.89);Muscle weakness (generalized) (M62.81);Difficulty in walking, not elsewhere classified (R26.2)    Time: 8144-8185 PT Time Calculation (min) (ACUTE ONLY): 17 min   Charges:   PT Evaluation $PT Eval High Complexity: 1 High         5:37 PM, 06/27/21 Rosamaria Lints, PT, DPT Physical Therapist - [facility]  [phone] ([place])    Zalan Shidler C 06/27/2021, 5:32 PM

## 2021-06-28 DIAGNOSIS — Z8739 Personal history of other diseases of the musculoskeletal system and connective tissue: Secondary | ICD-10-CM

## 2021-06-28 DIAGNOSIS — M1990 Unspecified osteoarthritis, unspecified site: Secondary | ICD-10-CM

## 2021-06-28 LAB — ANA COMPREHENSIVE PANEL
Anti JO-1: <0.2 AI (ref 0.0–0.9)
Centromere Ab Screen: <0.2 AI (ref 0.0–0.9)
Chromatin Ab SerPl-aCnc: 0.2 AI (ref 0.0–0.9)
ENA SM Ab Ser-aCnc: <0.2 AI (ref 0.0–0.9)
Ribonucleic Protein: 0.3 AI (ref 0.0–0.9)
SSA (Ro) (ENA) Antibody, IgG: <0.2 AI (ref 0.0–0.9)
SSB (La) (ENA) Antibody, IgG: <0.2 AI (ref 0.0–0.9)
Scleroderma (Scl-70) (ENA) Antibody, IgG: <0.2 AI (ref 0.0–0.9)
ds DNA Ab: 1 IU/mL (ref 0–9)

## 2021-06-28 LAB — RHEUMATOID FACTOR: Rheumatoid fact SerPl-aCnc: <10 IU/mL (ref <14.0)

## 2021-06-28 LAB — GLUCOSE, CAPILLARY
Glucose-Capillary: 126 mg/dL — ABNORMAL HIGH (ref 70–99)
Glucose-Capillary: 159 mg/dL — ABNORMAL HIGH (ref 70–99)
Glucose-Capillary: 141 mg/dL — ABNORMAL HIGH (ref 70–99)
Glucose-Capillary: 139 mg/dL — ABNORMAL HIGH (ref 70–99)

## 2021-06-28 LAB — CBC
HCT: 41.8 % (ref 39.0–52.0)
Hemoglobin: 13.6 g/dL (ref 13.0–17.0)
MCH: 29.7 pg (ref 26.0–34.0)
MCHC: 32.5 g/dL (ref 30.0–36.0)
MCV: 91.3 fL (ref 80.0–100.0)
Platelets: 594 10*3/uL — ABNORMAL HIGH (ref 150–400)
RBC: 4.58 MIL/uL (ref 4.22–5.81)
RDW: 12.9 % (ref 11.5–15.5)
WBC: 7.9 10*3/uL (ref 4.0–10.5)
nRBC: 0 % (ref 0.0–0.2)

## 2021-06-28 LAB — CREATININE, SERUM
Creatinine, Ser: 0.4 mg/dL — ABNORMAL LOW (ref 0.61–1.24)
GFR, Estimated: >60 mL/min (ref >60)

## 2021-06-28 LAB — COMPLEMENT, TOTAL: Compl, Total (CH50): >60 U/mL (ref >41)

## 2021-06-28 MED ORDER — IBUPROFEN 400 MG PO TABS
600.0000 mg | ORAL_TABLET | Freq: Four times a day (QID) | ORAL | Status: DC
  Administered 2021-06-28 – 2021-06-30 (×9): 600 mg via ORAL
  Filled 2021-06-28 (×9): qty 2

## 2021-06-28 NOTE — Progress Notes (Signed)
Inpatient Rehab Admissions Coordinator:   Per therapy recommendations,  patient was screened for CIR candidacy by Megan Salon, MS, CCC-SLP .At this time, Pt. Appears to be a a potential candidate for CIR. I will Megan Salon, MS, CCC-SLP.  Pt. Is relatively high level and CIR beds are limited this week, so it is possible he will progress to supervision level before CIR has a bed available. I will  order for rehab consult per protocol for full assessment. Please contact me any with questions.  Megan Salon, MS, CCC-SLP Rehab Admissions Coordinator  [phone] (celll) [phone] (office)

## 2021-06-28 NOTE — Care Management (Signed)
CIR referral, awaiting CIR input at this time.

## 2021-06-28 NOTE — Progress Notes (Signed)
Physical Therapy Treatment Patient Details Name: Mason Russell MRN: [id number] DOB: 1985/09/14 Today's Date: 06/28/2021   History of Present Illness Mason Russell is a 60yoM who comes to [facility] with severe progression in back/leg pain, no longer able to walk at home. workup revelaing for septic Rt SIJ. Pt consulted on by neurosurgical, rheumatology, and orthopedics. Pt has pain in multiple joints in body.    PT Comments    Pt in bed this morning, reports his pain slowly improving, better able to start rolling in bed ad lib to keep back loose. Pt educated on AA/ROM of legs in bed using implements as needed. LLE remains very stiff and limited in left knee, however he progressively has ~25% more ROM in both legs and 50% less movement anxiety related to pain. Pt still needs modA to EOB, when there balance is great. Pt able to stand with close minGuard assist with 2 AC per his preference from elevated surface, however it is very, very slow and guarded due to pain/weakness. Pt taken out to hallway where he is able to AMB with 4-point AC gait pattern, very short labored steps, but covers ~46f in 9+ minutes. [place] needs met. Pt voices gratitude that he can now mobilize to toilet rather than use bed pan again.    Recommendations for follow up therapy are one component of a multi-disciplinary discharge planning process, led by the attending physician.  Recommendations may be updated based on patient status, additional functional criteria and insurance authorization.  Follow Up Recommendations  Acute inpatient rehab (3hours/day) Can patient physically be transported by private vehicle: No   Assistance Recommended at Discharge Intermittent Supervision/Assistance  Patient can return home with the following Two people to help with walking and/or transfers;A lot of help with bathing/dressing/bathroom;Assistance with cooking/housework;Help with stairs or ramp for entrance;Assist  for transportation   Equipment Recommendations       Recommendations for Other Services       Precautions / Restrictions Precautions Precautions: Fall Restrictions Weight Bearing Restrictions: No     Mobility  Bed Mobility Overal bed mobility: Needs Assistance Bed Mobility: Supine to Sit     Supine to sit: Mod assist     General bed mobility comments: appears less painful, able to do more with hand held assist; needs mos thelp scooting hips forward    Transfers Overall transfer level: Needs assistance Equipment used: Crutches Transfers: Sit to/from Stand Sit to Stand: From elevated surface           General transfer comment: slow, cautious, calculated, with improvised and unorthodox yet effective technique. (he is convinced that RW would be more painful and less effective, hence the issue is not pushed at this time.)    Ambulation/Gait Ambulation/Gait assistance: Min guard (chairfollow) Gait Distance (Feet): 50 Feet Assistive device: Crutches         General Gait Details: 4-point AC gait, takes ~8 minutes to cover distance   Stairs             Wheelchair Mobility    Modified Rankin (Stroke Patients Only)       Balance                                            Cognition Arousal/Alertness: Awake/alert Behavior During Therapy: WFL for tasks assessed/performed Overall Cognitive Status: Within Functional Limits for tasks assessed  Exercises General Exercises - Lower Extremity Short Arc Quad: AROM, Both, 20 reps, Seated, Limitations Short Arc Quad Limitations: 2x10 Heel Slides: AAROM, AROM, Both, 15 reps, Supine, Limitations Heel Slides Limitations: self assist with bed sheet and/or gait belt Hip ABduction/ADduction: AAROM, Both, 15 reps, Supine, Limitations Hip Abduction/Adduction Limitations: self assist with bed sheet and/or gait belt Other Exercises Other  Exercises: seated repeated trunk flexion hands on chair x10 (AA/ROM hack/hip flexion)    General Comments        Pertinent Vitals/Pain Pain Assessment Pain Assessment: 0-10 Pain Score: 5  Pain Location: knee, left low back, feet Pain Descriptors / Indicators: Throbbing, Grimacing, Moaning, Aching, Cramping Pain Intervention(s): Limited activity within patient's tolerance, Monitored during session, Premedicated before session    Home Living                          Prior Function            PT Goals (current goals can now be found in the care plan section) Acute Rehab PT Goals Patient Stated Goal: regain basic mobility PT Goal Formulation: With patient Time For Goal Achievement: 07/11/21 Potential to Achieve Goals: Good Progress towards PT goals: Progressing toward goals    Frequency    7X/week      PT Plan      Co-evaluation              AM-PAC PT "6 Clicks" Mobility   Outcome Measure  Help needed turning from your back to your side while in a flat bed without using bedrails?: A Lot Help needed moving from lying on your back to sitting on the side of a flat bed without using bedrails?: A Lot Help needed moving to and from a bed to a chair (including a wheelchair)?: A Lot Help needed standing up from a chair using your arms (e.g., wheelchair or bedside chair)?: A Lot Help needed to walk in hospital room?: A Lot Help needed climbing 3-5 steps with a railing? : A Lot 6 Click Score: 12    End of Session Equipment Utilized During Treatment: Gait belt Activity Tolerance: No increased pain;Patient tolerated treatment well Patient left: in chair;with call bell/phone within reach   PT Visit Diagnosis: Unsteadiness on feet (R26.81);Other abnormalities of gait and mobility (R26.89);Muscle weakness (generalized) (M62.81);Difficulty in walking, not elsewhere classified (R26.2)     Time: 3893-7342 PT Time Calculation (min) (ACUTE ONLY): 58  min  Charges:  $Gait Training: 23-37 mins $Therapeutic Exercise: 23-37 mins                    12:14 PM, 06/28/21 Etta Grandchild, PT, DPT Physical Therapist - [facility]  [phone] ([place])   [facility] 06/28/2021, 12:09 PM

## 2021-06-28 NOTE — Progress Notes (Signed)
  Progress Note   Patient: Mason Russell ZOX:[id number] DOB: 07/16/1985 DOA: 06/24/2021     4 DOS: the patient was seen and examined on 06/28/2021   Brief hospital course: Mason Russell is a 36 y.o. 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  Patient has been having joint pain for the last 3 years, he was using crutches for the last 2 years due to pain and weakness After arriving in the hospital, patient had a fever up to 101.3, heart rate 112, x-rays of shoulder, elbow knees and hips did not show any bony involvement, with some joint effusion.  MRI of the right hip showed right SI joint effusion with mild periarticular bone marrow edema concerning for septic arthritis.  Patient is placed on antibiotics with vancomycin and cefepime, ordered SI joint aspiration per IR.  Patient has significant multijoint pain and effusion for 2 to 3 years, probably has autoimmune disease.  Rheumatology consult is ordered.  6/21.  Culture from SI joint aspiration came back no growth.  Started prednisone per recommendation from rheumatology.  Antibiotic discontinued. Currently, patient may need nursing placement.  Hopefully condition will improve significantly so that he can go home.  Assessment and Plan: Inflammatory arthritis. Sepsis ruled out. Septic arthritis ruled out. Patient was started on prednisone yesterday, also added ibuprofen today. Patient does not seem to have any infection, ID had discontinued all antibiotics.   Patient still have significant joint pain and weakness, PT has recommended nursing placement. We will continue treatment, continue PT/OT.  Obesity with BMI of 30.2. Continue diet and  exercise.  Hypokalemia. Improved.  Generalized weakness. Continue PT/OT       Subjective:  Patient still complaining of multijoint pain, weakness. Denies any short of breath.  Physical Exam: Vitals:   06/27/21 1605 06/27/21 1934 06/28/21 0506 06/28/21 0801  BP: 102/84 130/88 106/78 102/71  Pulse: 93 96 80 85  Resp: 17 18 18 18   Temp: 98.1 F (36.7 C) 99.2 F (37.3 C) 97.9 F (36.6 C) 98 F (36.7 C)  TempSrc:  Oral Oral Oral  SpO2: 95% 97% 92% 94%  Weight:      Height:       General exam: Appears calm and comfortable  Respiratory system: Clear to auscultation. Respiratory effort normal. Cardiovascular system: S1 & S2 heard, RRR. No JVD, murmurs, rubs, gallops or clicks. No pedal edema. Gastrointestinal system: Abdomen is nondistended, soft and nontender. No organomegaly or masses felt. Normal bowel sounds heard. Central nervous system: Alert and oriented. No focal neurological deficits. Extremities: Symmetric 5 x 5 power. Skin: No rashes, lesions or ulcers Psychiatry: Judgement and insight appear normal. Mood & affect appropriate.   Data Reviewed:  Lab results reviewed  Family Communication:   Disposition: Status is: Inpatient Remains inpatient appropriate because: Unsafe discharge.  Planned Discharge Destination:  Pending    Time spent: 35 minutes  Author: , MD 06/28/2021 12:47 PM  For on call review [email or web address]/2021.

## 2021-06-28 NOTE — Progress Notes (Signed)
Date of Admission:  06/24/2021     ID: Mason Russell is a 36 y.o. male Principal Problem:   Septic arthritis of sacroiliac joint (HCC) Active Problems:   Gout   Acute lumbar radiculopathy   Sepsis (HCC)   Obesity with body mass index (BMI) of 30.0 to 39.9   Hypokalemia    Subjective: Pt is doing much better since starting prednisone Pain left knee and rt hip improving He walked with crutches in the corridor with PT  Medications:   colchicine  0.6 mg Oral BID   enoxaparin (LOVENOX) injection  40 mg Subcutaneous Q24H   ibuprofen  600 mg Oral QID   predniSONE  40 mg Oral Q breakfast    Objective: Patient Vitals for the past 24 hrs:  BP Temp Temp src Pulse Resp SpO2  06/28/21 0801 102/71 98 F (36.7 C) Oral 85 18 94 %  06/28/21 0506 106/78 97.9 F (36.6 C) Oral 80 18 92 %  06/27/21 1934 130/88 99.2 F (37.3 C) Oral 96 18 97 %  06/27/21 1605 102/84 98.1 F (36.7 C) -- 93 17 95 %       PHYSICAL EXAM:  General: Alert, cooperative, no distress, appears stated age.  Head: Normocephalic, without obvious abnormality, atraumatic. Eyes: Conjunctivae clear, anicteric sclerae. Pupils are equal ENT Nares normal. No drainage or sinus tenderness. Lips, mucosa, and tongue normal. No Thrush Neck: Supple, symmetrical, no adenopathy, thyroid: non tender no carotid bruit and no JVD. Back: No CVA tenderness. Lungs: Clear to auscultation bilaterally. No Wheezing or Rhonchi. No rales. Heart: Regular rate and rhythm, no murmur, rub or gallop. Abdomen: Soft, non-tender,not distended. Bowel sounds normal. No masses Extremities:left knee restricted flexion Skin: No rashes or lesions. Or bruising Lymph: Cervical, supraclavicular normal. Neurologic: Grossly non-focal  Lab Results Recent Labs    06/27/21 0504 06/28/21 0515  WBC  --  7.9  HGB  --  13.6  HCT  --  41.8  CREATININE 0.58* 0.40*   Liver Panel No results for input(s): "PROT", "ALBUMIN", "AST", "ALT", "ALKPHOS",  "BILITOT", "BILIDIR", "IBILI" in the last 72 hours. Sedimentation Rate Recent Labs    06/27/21 0504  ESRSEDRATE 83*     Microbiology: Rt SI joint fluid culture no growth so far Studies/Results: CT ASPIRATION  Result Date: 06/26/2021 INDICATION: Septic arthritis EXAM: CT-GUIDED RIGHT SACROILIAC JOINT ASPIRATION RADIATION DOSE REDUCTION: This exam was performed according to the departmental dose-optimization program which includes automated exposure control, adjustment of the mA and/or kV according to patient size and/or use of iterative reconstruction technique. COMPARISON:  None Available. MEDICATIONS: None. ANESTHESIA/SEDATION: Local anesthetic was administered. CONTRAST:  None. COMPLICATIONS: None immediate. PROCEDURE: Informed consent was obtained from the patient following an explanation of the procedure, risks, benefits and alternatives. A time out was performed prior to the initiation of the procedure. The patient was positioned prone on the CT table and a limited CT was performed for procedural planning demonstrating the RIGHT sacroiliac joint. The procedure was planned. The operative site was prepped and draped in the usual sterile fashion. Appropriate trajectory was confirmed after the adjacent tissues were anesthetized with 1% Lidocaine. Under intermittent CT guidance, a 22 gauge spinal needle was advanced into the posterior portion of the RIGHT sacroiliac joint. Appropriate positioning was confirmed and aspiration was performed and samples were obtained. Approximately 1 mL of sanguinous joint fluid was aspirated. Samples were sent to the laboratory for analysis as ordered by the clinical team. The needle was removed and a dressing was  placed. The patient tolerated procedure well without immediate postprocedural complication. IMPRESSION: Successful CT guided aspiration of the RIGHT SI joint as above. Roanna Banning, MD [facility]  Electronically Signed   By: Roanna Banning M.D.   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

## 2021-06-29 LAB — ANTIPHOSPHOLIPID SYNDROME EVAL, BLD
Anticardiolipin IgA: <9 APL U/mL (ref 0–11)
Anticardiolipin IgG: <9 GPL U/mL (ref 0–14)
Anticardiolipin IgM: <9 MPL U/mL (ref 0–12)
DRVVT: 26.4 s (ref 0.0–47.0)
PTT Lupus Anticoagulant: 33 s (ref 0.0–43.5)
Phosphatydalserine, IgA: 2 APS Units (ref 0–19)
Phosphatydalserine, IgG: <9 Units (ref 0–30)
Phosphatydalserine, IgM: <10 Units (ref 0–30)

## 2021-06-29 LAB — CULTURE, BLOOD (ROUTINE X 2)
Culture: NO GROWTH
Culture: NO GROWTH

## 2021-06-29 LAB — BARTONELLA ANTIBODY PANEL
B Quintana IgM: NEGATIVE titer
B henselae IgG: NEGATIVE titer
B henselae IgM: NEGATIVE titer
B quintana IgG: NEGATIVE titer

## 2021-06-29 LAB — LYME DISEASE SEROLOGY W/REFLEX: Lyme Total Antibody EIA: NEGATIVE

## 2021-06-29 LAB — GLUCOSE, CAPILLARY
Glucose-Capillary: 105 mg/dL — ABNORMAL HIGH (ref 70–99)
Glucose-Capillary: 149 mg/dL — ABNORMAL HIGH (ref 70–99)

## 2021-06-29 LAB — CYCLIC CITRUL PEPTIDE ANTIBODY, IGG/IGA: CCP Antibodies IgG/IgA: 9 units (ref 0–19)

## 2021-06-29 MED ORDER — ADULT MULTIVITAMIN W/MINERALS CH
1.0000 | ORAL_TABLET | Freq: Every day | ORAL | Status: DC
  Administered 2021-06-29 – 2021-06-30 (×2): 1 via ORAL
  Filled 2021-06-29 (×2): qty 1

## 2021-06-29 MED ORDER — PANTOPRAZOLE SODIUM 40 MG PO TBEC
40.0000 mg | DELAYED_RELEASE_TABLET | Freq: Every day | ORAL | Status: DC
  Administered 2021-06-29 – 2021-06-30 (×2): 40 mg via ORAL
  Filled 2021-06-29 (×2): qty 1

## 2021-06-29 NOTE — Plan of Care (Signed)

## 2021-06-29 NOTE — TOC Progression Note (Signed)
Transition of Care [facility]) - Progression Note    Patient Details  Name: Mason Russell MRN: [id number] Date of Birth: [birth year]  Transition of Care [facility]) CM/SW Contact  Truddie Hidden, RN Phone Number: 06/29/2021, 2:20 PM  Clinical Narrative:  Spoke with patient by phone. Patient is uninsured and unable to afford cost associated with CIR. Contacted on call on call charity [facility] for this week, [place]. Patient will be able to be seen Monday for start of care. Patient agreeable to services. Address and phone number verified.         Expected Discharge Plan and Services                                                 Social Determinants of Health (SDOH) Interventions    Readmission Risk Interventions     No data to display

## 2021-06-29 NOTE — Progress Notes (Signed)
This RN messaged Dr. Allena Katz regarding Chlamydia/GC Swab that was ordered. Patient questioning reasoning behind order. Pt updated that MD was messaged and will wait to perform swab until MD has responded per pt request.

## 2021-06-29 NOTE — Progress Notes (Addendum)
PROGRESS NOTE  Mason Russell [id number] DOB: [birth year] DOA: 06/24/2021 PCP: System, Provider Not In  HPI/Recap of past 24 hours: Mason Russell is a 36 y.o. male with medical history significant of gout, obesity with BMI 30, scoliosis, remote history of IV drug use, marijuana use, who presents with lower back pain, multiple joint pain including bilateral knees, left shoulder and left elbow for the last 3 years.  He was using crutches for the last 2 years due to pain and weakness.  Upon presentation, febrile with Tmax of 101.3, heart rate 112. X-rays of shoulder, L elbow, knees and hips did not show any bony involvement, showed some joint effusion.  MRI of the right hip showed right SI joint effusion with mild periarticular bone marrow edema concerning for septic arthritis.  Patient was placed on antibiotics with vancomycin and cefepime, SI joint aspiration fluid per IR was negative.  Seen by rheumatology.  Antibiotics were discontinued.  Started on prednisone, 40 mg daily, colchicine 0.6 mg twice daily, scheduled ibuprofen 600 mg 4 times daily.  Added p.o. Protonix on 06/29/2021.  Patient was seen by PT OT with recommendation for inpatient rehab.  The patient declined inpatient rehab due to cost.  06/29/2021: The patient was seen and examined at his bedside.  Reports he is making efforts to ambulate more frequently.  He is taking precaution for falls.    Assessment/Plan: Principal Problem:   Septic arthritis of sacroiliac joint (HCC) Active Problems:   Sepsis ([place])   Acute lumbar radiculopathy   Gout   Obesity with body mass index (BMI) of 30.0 to 39.9   Hypokalemia   Inflammatory arthritis  Inflammatory arthritis. CRP elevated 19.6 on 06/24/2021. Sed rate 83 on 06/24/2021. Sepsis ruled out. Septic arthritis ruled out. Seen by rheumatology. Antibiotics were discontinued.  Started on prednisone, 40 mg daily, colchicine 0.6 mg twice daily, scheduled ibuprofen 600 mg 4 times daily.   Added p.o. Protonix on 06/29/2021 as GI prophylaxis. Continue PT OT with assistance and fall precautions.  Obesity with BMI of 30.2. Recommend weight loss outpatient with regular physical activity and healthy dieting.  Resolved hypokalemia post repletion.  Generalized weakness. Improving with PT OT, continue.     Code Status: Full code  Family Communication: None at bedside  Disposition Plan: Likely will discharge to home   Consultants: Rheumatology IR  Procedures: Joint aspiration by IR.  Antimicrobials: Discontinued  DVT prophylaxis: Subcu Lovenox daily  Status is: Inpatient The patient requires at least 2 midnights for further evaluation and treatment of present condition.    Objective: Vitals:   06/28/21 1515 06/28/21 2119 06/29/21 0426 06/29/21 0700  BP: 106/84 (!) 109/96 99/73 103/72  Pulse: 94 92 64 70  Resp: 18 18 16 18   Temp: 97.8 F (36.6 C) 97.9 F (36.6 C) (!) 97.5 F (36.4 C) 97.9 F (36.6 C)  TempSrc:    Oral  SpO2: 97% 97% 98% 96%  Weight:      Height:        Intake/Output Summary (Last 24 hours) at 06/29/2021 1235 Last data filed at 06/29/2021 1054 Gross per 24 hour  Intake 480 ml  Output 2301 ml  Net -1821 ml   Filed Weights   06/24/21 0521  Weight: 74.8 kg    Exam:  General: 36 y.o. year-old male well developed well nourished in no acute distress.  Alert and oriented x3. Cardiovascular: Regular rate and rhythm with no rubs or gallops.  No thyromegaly or JVD noted.   Respiratory:  Clear to auscultation with no wheezes or rales. Good inspiratory effort. Abdomen: Soft nontender nondistended with normal bowel sounds x4 quadrants. Musculoskeletal: Mild joint edema involving lower extremity and upper extremities. 2/4 pulses in all 4 extremities. Skin: No ulcerative lesions noted or rashes, Psychiatry: Mood is appropriate for condition and setting   Data Reviewed: CBC: Recent Labs  Lab 06/24/21 0545 06/25/21 0501 06/28/21 0515   WBC 10.8* 9.8 7.9  NEUTROABS 8.0*  --   --   HGB 13.9 12.7* 13.6  HCT 43.4 39.1 41.8  MCV 92.9 92.0 91.3  PLT 446* 394 [id number]*   Basic Metabolic Panel: Recent Labs  Lab 06/24/21 0545 06/24/21 0645 06/25/21 0501 06/27/21 0504 06/28/21 0515  NA 141  --  136  --   --   K 3.3*  --  3.7  --   --   CL 106  --  106  --   --   CO2 24  --  23  --   --   GLUCOSE 145*  --  123*  --   --   BUN 13  --  9  --   --   CREATININE 0.68  --  0.61 0.58* 0.40*  CALCIUM 9.6  --  8.9  --   --   MG  --  2.3  --   --   --    GFR: Estimated Creatinine Clearance: 113.2 mL/min (A) (by C-G formula based on SCr of 0.4 mg/dL (L)). Liver Function Tests: Recent Labs  Lab 06/24/21 0545  AST 31  ALT 34  ALKPHOS 69  BILITOT 1.3*  PROT 8.9*  ALBUMIN 3.9   No results for input(s): "LIPASE", "AMYLASE" in the last 168 hours. No results for input(s): "AMMONIA" in the last 168 hours. Coagulation Profile: Recent Labs  Lab 06/24/21 1110  INR 1.1   Cardiac Enzymes: No results for input(s): "CKTOTAL", "CKMB", "CKMBINDEX", "TROPONINI" in the last 168 hours. BNP (last 3 results) No results for input(s): "PROBNP" in the last 8760 hours. HbA1C: No results for input(s): "HGBA1C" in the last 72 hours. CBG: Recent Labs  Lab 06/28/21 1233 06/28/21 1517 06/28/21 2121 06/29/21 0815 06/29/21 1143  GLUCAP 159* 141* 139* 105* 149*   Lipid Profile: No results for input(s): "CHOL", "HDL", "LDLCALC", "TRIG", "CHOLHDL", "LDLDIRECT" in the last 72 hours. Thyroid Function Tests: No results for input(s): "TSH", "T4TOTAL", "FREET4", "T3FREE", "THYROIDAB" in the last 72 hours. Anemia Panel: No results for input(s): "VITAMINB12", "FOLATE", "FERRITIN", "TIBC", "IRON", "RETICCTPCT" in the last 72 hours. Urine analysis:    Component Value Date/Time   COLORURINE YELLOW (A) 06/24/2021 0545   APPEARANCEUR HAZY (A) 06/24/2021 0545   LABSPEC 1.026 06/24/2021 0545   PHURINE 5.0 06/24/2021 0545   GLUCOSEU NEGATIVE  06/24/2021 0545   HGBUR SMALL (A) 06/24/2021 0545   BILIRUBINUR NEGATIVE 06/24/2021 0545   KETONESUR 5 (A) 06/24/2021 0545   PROTEINUR NEGATIVE 06/24/2021 0545   NITRITE NEGATIVE 06/24/2021 0545   LEUKOCYTESUR NEGATIVE 06/24/2021 0545   Sepsis Labs: @LABRCNTIP (procalcitonin:4,lacticidven:4)  ) Recent Results (from the past 240 hour(s))  Urine Culture     Status: Abnormal   Collection Time: 06/24/21  5:45 AM   Specimen: Urine, Clean Catch  Result Value Ref Range Status   Specimen Description   Final    URINE, CLEAN CATCH Performed at [facility], [address] [place], [place] [address]    Special Requests   Final    NONE Performed at [facility], [address] [place], [place] [address]    Culture (A)  Final    <10,000 COLONIES/mL INSIGNIFICANT GROWTH Performed at [facility], [address] [place], [place] [address]    Report Status 06/25/2021 FINAL  Final  Blood culture (routine x 2)     Status: None   Collection Time: 06/24/21  5:45 AM   Specimen: BLOOD  Result Value Ref Range Status   Specimen Description BLOOD RIGHT HAND  Final   Special Requests   Final    BOTTLES DRAWN AEROBIC AND ANAEROBIC Blood Culture results may not be optimal due to an excessive volume of blood received in culture bottles   Culture   Final    NO GROWTH 5 DAYS Performed at [facility], [address], [place], [place] [address]    Report Status 06/29/2021 FINAL  Final  Blood culture (routine x 2)     Status: None   Collection Time: 06/24/21  5:45 AM   Specimen: BLOOD  Result Value Ref Range Status   Specimen Description BLOOD LEFT AC  Final   Special Requests   Final    BOTTLES DRAWN AEROBIC AND ANAEROBIC Blood Culture results may not be optimal due to an excessive volume of blood received in culture bottles   Culture   Final    NO GROWTH 5 DAYS Performed at [facility], [address] [place], [place] [address]    Report Status  06/29/2021 FINAL  Final  SARS Coronavirus 2 by RT PCR (hospital order, performed in [facility] hospital lab) *cepheid single result test* Anterior Nasal Swab     Status: None   Collection Time: 06/24/21  5:45 AM   Specimen: Anterior Nasal Swab  Result Value Ref Range Status   SARS Coronavirus 2 by RT PCR NEGATIVE NEGATIVE Final    Comment: (NOTE) SARS-CoV-2 target nucleic acids are NOT DETECTED.  The SARS-CoV-2 RNA is generally detectable in upper and lower respiratory specimens during the acute phase of infection. The lowest concentration of SARS-CoV-2 viral copies this assay can detect is 250 copies / mL. A negative result does not preclude SARS-CoV-2 infection and should not be used as the sole basis for treatment or other patient management decisions.  A negative result may occur with improper specimen collection / handling, submission of specimen other than nasopharyngeal swab, presence of viral mutation(s) within the areas targeted by this assay, and inadequate number of viral copies (<250 copies / mL). A negative result must be combined with clinical observations, patient history, and epidemiological information.  Fact Sheet for Patients:   RoadLapTop.co.za  Fact Sheet for Healthcare Providers: http://kim-miller.com/  This test is not yet approved or  cleared by the [place] FDA and has been authorized for detection and/or diagnosis of SARS-CoV-2 by FDA under an Emergency Use Authorization (EUA).  This EUA will remain in effect (meaning this test can be used) for the duration of the COVID-19 declaration under Section 564(b)(1) of the Act, 21 U.S.C. section 360bbb-3(b)(1), unless the authorization is terminated or revoked sooner.  Performed at [facility], [address] [place], [place] [address]   Aerobic/Anaerobic Culture w Gram Stain (surgical/deep wound)     Status: None (Preliminary result)   Collection  Time: 06/26/21 12:59 PM   Specimen: Synovium; Synovial Fluid  Result Value Ref Range Status   Specimen Description   Final    SYNOVIAL Performed at [facility], [address], [place], [place] [address]    Special  Requests   Final    RIGHT SACROLIIATE JOINT ASPIRATE Performed at [facility], [address] [place], [place] [address]    Gram Stain   Final    ABUNDANT WBC PRESENT,BOTH PMN AND MONONUCLEAR NO ORGANISMS SEEN    Culture   Final    NO GROWTH 3 DAYS Performed at [facility], [address], [place], [place] [address]    Report Status PENDING  Incomplete      Studies: No results found.  Scheduled Meds:  colchicine  0.6 mg Oral BID   enoxaparin (LOVENOX) injection  40 mg Subcutaneous Q24H   ibuprofen  600 mg Oral QID   multivitamin with minerals  1 tablet Oral Daily   pantoprazole  40 mg Oral Daily   predniSONE  40 mg Oral Q breakfast    Continuous Infusions:   LOS: 5 days     Darlin Drop, MD [facility] Pager [phone]  If 7PM-7AM, please contact night-coverage [email or web address] Password [facility] 06/29/2021, 12:35 PM

## 2021-06-29 NOTE — Progress Notes (Signed)
Physical Therapy Treatment Patient Details Name: Mason Russell MRN: [id number] DOB: 01/26/1985 Today's Date: 06/29/2021   History of Present Illness Mason Russell is a 35yoM who comes to [facility] with severe progression in back/leg pain, no longer able to walk at home. workup revelaing for septic Rt SIJ. Pt consulted on by neurosurgical, rheumatology, and orthopedics. Pt has pain in multiple joints in body.    PT Comments    Pt continued to show rapid progress in his independence, capacity, and tolerance for basic movements. Upon entry pt already ad lib bed to BR to void and bathe self. Pt reports able to stand at sick for a time without AD to perform grooming tasks. Pt educated on HEP with handout. Pt able to AMB fully around 1C with AC, progressed gait pattern and gait speed without cues. Author has updated DC recommendations to reflect progress. Pt feels he can manage at home at this time.     Recommendations for follow up therapy are one component of a multi-disciplinary discharge planning process, led by the attending physician.  Recommendations may be updated based on patient status, additional functional criteria and insurance authorization.  Follow Up Recommendations  Home health PT Can patient physically be transported by private vehicle: Yes   Assistance Recommended at Discharge PRN  Patient can return home with the following Help with stairs or ramp for entrance;Assist for transportation;A little help with walking and/or transfers;Assistance with cooking/housework   Equipment Recommendations  None recommended by PT    Recommendations for Other Services       Precautions / Restrictions Precautions Precautions: Fall Precaution Comments: mod fall risk Restrictions Weight Bearing Restrictions: No     Mobility  Bed Mobility Overal bed mobility: Modified Independent             General bed mobility comments: moving ad lib in room prior to entry     Transfers Overall transfer level: Needs assistance Equipment used: Crutches, None Transfers: Sit to/from Stand Sit to Stand: From elevated surface           General transfer comment: able to 5x STS hands free from elevated surface, slowly    Ambulation/Gait Ambulation/Gait assistance: Supervision Gait Distance (Feet): 240 Feet Assistive device: Crutches Gait Pattern/deviations: Step-to pattern Gait velocity: 0.54m/s     General Gait Details: advances to 3-point gait naturally due to improved pain and gait speed. no LOB, fluet use of AC   Stairs             Wheelchair Mobility    Modified Rankin (Stroke Patients Only)       Balance                                            Cognition Arousal/Alertness: Awake/alert Behavior During Therapy: WFL for tasks assessed/performed Overall Cognitive Status: Within Functional Limits for tasks assessed                                          Exercises General Exercises - Lower Extremity Ankle Circles/Pumps: AROM, Both, 10 reps, Seated Quad Sets: AROM, Seated, Both, 10 reps Gluteal Sets: AROM, Seated, Both, 10 reps Heel Slides: AROM, Both, 10 reps, Supine Hip ABduction/ADduction: Both, Supine, AROM, 10 reps Straight Leg Raises: AROM, Seated, Both, 10 reps,  Supine    General Comments        Pertinent Vitals/Pain Pain Assessment Pain Assessment: No/denies pain    Home Living                          Prior Function            PT Goals (current goals can now be found in the care plan section) Acute Rehab PT Goals Patient Stated Goal: regain basic mobility PT Goal Formulation: With patient Time For Goal Achievement: 07/11/21 Potential to Achieve Goals: Good Progress towards PT goals: Progressing toward goals    Frequency    7X/week      PT Plan Discharge plan needs to be updated;Frequency needs to be updated    Co-evaluation               AM-PAC PT "6 Clicks" Mobility   Outcome Measure  Help needed turning from your back to your side while in a flat bed without using bedrails?: None Help needed moving from lying on your back to sitting on the side of a flat bed without using bedrails?: None Help needed moving to and from a bed to a chair (including a wheelchair)?: None Help needed standing up from a chair using your arms (e.g., wheelchair or bedside chair)?: None Help needed to walk in hospital room?: A Little Help needed climbing 3-5 steps with a railing? : A Little 6 Click Score: 22    End of Session Equipment Utilized During Treatment: Gait belt Activity Tolerance: No increased pain;Patient tolerated treatment well Patient left: in chair;with call bell/phone within reach   PT Visit Diagnosis: Unsteadiness on feet (R26.81);Other abnormalities of gait and mobility (R26.89);Muscle weakness (generalized) (M62.81);Difficulty in walking, not elsewhere classified (R26.2)     Time: 2376-2831 PT Time Calculation (min) (ACUTE ONLY): 33 min  Charges:  $Gait Training: 8-22 mins $Therapeutic Exercise: 8-22 mins                    1:13 PM, 06/29/21 Rosamaria Lints, PT, DPT Physical Therapist - [facility]  [phone] (ASCOM)     Mason Russell C 06/29/2021, 1:10 PM

## 2021-06-29 NOTE — Progress Notes (Signed)
[facility] IP rehab admissions - I called and spoke with patient by phone.  I discussed rehab options with patient.  He does not have insurance.  I explained the costs for inpatient rehab.  At this point, since patient has no medical coverage for inpatient rehab, he prefers to have his therapies after discharge home.  If he changes his mind, please call me at [phone].

## 2021-06-29 NOTE — Progress Notes (Signed)
   06/24/21 2348  Assess: MEWS Score  Temp (!) 101.6 F (38.7 C)  BP (!) 130/96  MAP (mmHg) 103  Pulse Rate (!) 121  Resp 20  SpO2 95 %   MD notified, CN notified, pt given oral tylenol, ibuprofen placed on O2 x 2L, and given one dose of analgesic. MEWs guidelines implemented at 2030. Call bell in reach, bed in lowest position, no signs of distress at this time, will continue to monitor closely

## 2021-06-30 LAB — GLUCOSE, CAPILLARY: Glucose-Capillary: 124 mg/dL — ABNORMAL HIGH (ref 70–99)

## 2021-06-30 LAB — BRUCELLA ANTIBODY IGM, EIA: Brucella Antibody IgM, EIA: NEGATIVE

## 2021-06-30 LAB — CHLAMYDIA/NGC RT PCR (ARMC ONLY)
Chlamydia Tr: NOT DETECTED
N gonorrhoeae: NOT DETECTED

## 2021-06-30 LAB — BRUCELLA ANTIBODY IGG, EIA: Brucella Antibody IgG, EIA: NEGATIVE

## 2021-06-30 MED ORDER — COLCHICINE 0.6 MG PO TABS: 0.6000 mg | ORAL_TABLET | Freq: Two times a day (BID) | ORAL | 0 refills | Status: DC

## 2021-06-30 MED ORDER — PREDNISONE 20 MG PO TABS
40.0000 mg | ORAL_TABLET | Freq: Every day | ORAL | 0 refills | Status: DC
Start: 2021-07-01 — End: 2021-06-30

## 2021-06-30 MED ORDER — ADULT MULTIVITAMIN W/MINERALS CH: 1.0000 | ORAL_TABLET | Freq: Every day | ORAL | 0 refills | Status: DC

## 2021-06-30 MED ORDER — PREDNISONE 20 MG PO TABS: 40.0000 mg | ORAL_TABLET | Freq: Every day | ORAL | 0 refills | Status: DC

## 2021-06-30 MED ORDER — IBUPROFEN 600 MG PO TABS: 600.0000 mg | ORAL_TABLET | Freq: Four times a day (QID) | ORAL | 0 refills | Status: DC

## 2021-06-30 MED ORDER — IBUPROFEN 600 MG PO TABS: 600.0000 mg | ORAL_TABLET | Freq: Four times a day (QID) | ORAL | 0 refills | Status: AC

## 2021-06-30 MED ORDER — PANTOPRAZOLE SODIUM 40 MG PO TBEC: 40.0000 mg | DELAYED_RELEASE_TABLET | Freq: Every day | ORAL | 0 refills | Status: DC

## 2021-06-30 MED ORDER — PREDNISONE 20 MG PO TABS: 40.0000 mg | ORAL_TABLET | Freq: Every day | ORAL | 0 refills | Status: AC

## 2021-07-01 LAB — AEROBIC/ANAEROBIC CULTURE W GRAM STAIN (SURGICAL/DEEP WOUND): Culture: NO GROWTH

## 2021-07-03 LAB — HLA-B27 ANTIGEN: HLA-B27: NEGATIVE

## 2021-09-28 ENCOUNTER — Observation Stay
Admission: EM | Admit: 2021-09-28 | Discharge: 2021-09-29 | Disposition: A | Discharge status: Home Health | Payer: Self-pay | Attending: Internal Medicine | Admitting: Internal Medicine

## 2021-09-28 ENCOUNTER — Encounter: Payer: Self-pay | Admitting: Emergency Medicine

## 2021-09-28 ENCOUNTER — Emergency Department: Payer: Self-pay

## 2021-09-28 ENCOUNTER — Other Ambulatory Visit: Payer: Self-pay

## 2021-09-28 ENCOUNTER — Observation Stay: Payer: Self-pay

## 2021-09-28 DIAGNOSIS — M5431 Sciatica, right side: Secondary | ICD-10-CM

## 2021-09-28 DIAGNOSIS — R2689 Other abnormalities of gait and mobility: Principal | ICD-10-CM | POA: Insufficient documentation

## 2021-09-28 DIAGNOSIS — Z23 Encounter for immunization: Secondary | ICD-10-CM | POA: Insufficient documentation

## 2021-09-28 DIAGNOSIS — M87852 Other osteonecrosis, left femur: Secondary | ICD-10-CM | POA: Insufficient documentation

## 2021-09-28 DIAGNOSIS — X500XXA Overexertion from strenuous movement or load, initial encounter: Secondary | ICD-10-CM | POA: Insufficient documentation

## 2021-09-28 DIAGNOSIS — Z79899 Other long term (current) drug therapy: Secondary | ICD-10-CM | POA: Insufficient documentation

## 2021-09-28 DIAGNOSIS — M109 Gout, unspecified: Secondary | ICD-10-CM | POA: Diagnosis present

## 2021-09-28 DIAGNOSIS — M5416 Radiculopathy, lumbar region: Secondary | ICD-10-CM | POA: Insufficient documentation

## 2021-09-28 DIAGNOSIS — K219 Gastro-esophageal reflux disease without esophagitis: Secondary | ICD-10-CM

## 2021-09-28 DIAGNOSIS — M5441 Lumbago with sciatica, right side: Secondary | ICD-10-CM | POA: Insufficient documentation

## 2021-09-28 DIAGNOSIS — Z87891 Personal history of nicotine dependence: Secondary | ICD-10-CM | POA: Insufficient documentation

## 2021-09-28 DIAGNOSIS — R262 Difficulty in walking, not elsewhere classified: Secondary | ICD-10-CM

## 2021-09-28 DIAGNOSIS — Z8739 Personal history of other diseases of the musculoskeletal system and connective tissue: Secondary | ICD-10-CM | POA: Diagnosis present

## 2021-09-28 DIAGNOSIS — M1990 Unspecified osteoarthritis, unspecified site: Secondary | ICD-10-CM | POA: Diagnosis present

## 2021-09-28 DIAGNOSIS — M87851 Other osteonecrosis, right femur: Secondary | ICD-10-CM | POA: Insufficient documentation

## 2021-09-28 LAB — BASIC METABOLIC PANEL
Anion gap: 10 (ref 5–15)
BUN: 8 mg/dL (ref 6–20)
CO2: 25 mmol/L (ref 22–32)
Calcium: 9.4 mg/dL (ref 8.9–10.3)
Chloride: 104 mmol/L (ref 98–111)
Creatinine, Ser: 0.73 mg/dL (ref 0.61–1.24)
GFR, Estimated: >60 mL/min (ref >60)
Glucose, Bld: 107 mg/dL — ABNORMAL HIGH (ref 70–99)
Potassium: 3.5 mmol/L (ref 3.5–5.1)
Sodium: 139 mmol/L (ref 135–145)

## 2021-09-28 LAB — CBC WITH DIFFERENTIAL/PLATELET
Abs Immature Granulocytes: 0.05 10*3/uL (ref 0.00–0.07)
Basophils Absolute: 0.1 10*3/uL (ref 0.0–0.1)
Basophils Relative: 1 %
Eosinophils Absolute: 0.3 10*3/uL (ref 0.0–0.5)
Eosinophils Relative: 3 %
HCT: 45.5 % (ref 39.0–52.0)
Hemoglobin: 14.7 g/dL (ref 13.0–17.0)
Immature Granulocytes: 1 %
Lymphocytes Relative: 16 %
Lymphs Abs: 1.6 10*3/uL (ref 0.7–4.0)
MCH: 30.2 pg (ref 26.0–34.0)
MCHC: 32.3 g/dL (ref 30.0–36.0)
MCV: 93.6 fL (ref 80.0–100.0)
Monocytes Absolute: 0.8 10*3/uL (ref 0.1–1.0)
Monocytes Relative: 8 %
Neutro Abs: 6.8 10*3/uL (ref 1.7–7.7)
Neutrophils Relative %: 71 %
Platelets: 314 10*3/uL (ref 150–400)
RBC: 4.86 MIL/uL (ref 4.22–5.81)
RDW: 13.4 % (ref 11.5–15.5)
WBC: 9.5 10*3/uL (ref 4.0–10.5)
nRBC: 0 % (ref 0.0–0.2)

## 2021-09-28 MED ORDER — PREDNISONE 10 MG PO TABS: ORAL_TABLET | ORAL | 0 refills | Status: DC

## 2021-09-28 MED ORDER — COLCHICINE 0.6 MG PO TABS: 0.6000 mg | ORAL_TABLET | Freq: Two times a day (BID) | ORAL | Status: DC

## 2021-09-28 MED ORDER — ACETAMINOPHEN 650 MG RE SUPP: 650.0000 mg | Freq: Four times a day (QID) | RECTAL | Status: DC | PRN

## 2021-09-28 MED ORDER — KETOROLAC TROMETHAMINE 30 MG/ML IJ SOLN
30.0000 mg | Freq: Once | INTRAMUSCULAR | Status: AC
  Administered 2021-09-28: 30 mg via INTRAVENOUS
  Filled 2021-09-28: qty 1

## 2021-09-28 MED ORDER — ONDANSETRON HCL 4 MG/2ML IJ SOLN: 4.0000 mg | Freq: Four times a day (QID) | INTRAMUSCULAR | Status: DC | PRN

## 2021-09-28 MED ORDER — ACETAMINOPHEN 325 MG PO TABS: 650.0000 mg | ORAL_TABLET | Freq: Four times a day (QID) | ORAL | Status: DC | PRN

## 2021-09-28 MED ORDER — HYDROMORPHONE HCL 1 MG/ML IJ SOLN
1.0000 mg | Freq: Once | INTRAMUSCULAR | Status: AC
  Administered 2021-09-28: 1 mg via INTRAVENOUS
  Filled 2021-09-28: qty 1

## 2021-09-28 MED ORDER — CYCLOBENZAPRINE HCL 10 MG PO TABS: 10.0000 mg | ORAL_TABLET | Freq: Three times a day (TID) | ORAL | 0 refills | Status: DC | PRN

## 2021-09-28 MED ORDER — MAGNESIUM HYDROXIDE 400 MG/5ML PO SUSP: 30.0000 mL | Freq: Every day | ORAL | Status: DC | PRN

## 2021-09-28 MED ORDER — DEXAMETHASONE SODIUM PHOSPHATE 10 MG/ML IJ SOLN
6.0000 mg | INTRAMUSCULAR | Status: DC
  Administered 2021-09-29: 6 mg via INTRAVENOUS
  Filled 2021-09-28: qty 1

## 2021-09-28 MED ORDER — ADULT MULTIVITAMIN W/MINERALS CH
1.0000 | ORAL_TABLET | Freq: Every day | ORAL | Status: DC
  Administered 2021-09-29 (×2): 1 via ORAL
  Filled 2021-09-28 (×2): qty 1

## 2021-09-28 MED ORDER — MORPHINE SULFATE (PF) 4 MG/ML IV SOLN
4.0000 mg | Freq: Once | INTRAVENOUS | Status: AC
  Administered 2021-09-28: 4 mg via INTRAVENOUS
  Filled 2021-09-28: qty 1

## 2021-09-28 MED ORDER — METHOCARBAMOL 500 MG PO TABS
500.0000 mg | ORAL_TABLET | Freq: Four times a day (QID) | ORAL | Status: DC | PRN
  Filled 2021-09-28: qty 1

## 2021-09-28 MED ORDER — ENOXAPARIN SODIUM 40 MG/0.4ML IJ SOSY
40.0000 mg | PREFILLED_SYRINGE | INTRAMUSCULAR | Status: DC
  Administered 2021-09-29: 40 mg via SUBCUTANEOUS
  Filled 2021-09-28: qty 0.4

## 2021-09-28 MED ORDER — ONDANSETRON HCL 4 MG PO TABS: 4.0000 mg | ORAL_TABLET | Freq: Four times a day (QID) | ORAL | Status: DC | PRN

## 2021-09-28 MED ORDER — NAPROXEN 500 MG PO TABS: 500.0000 mg | ORAL_TABLET | Freq: Two times a day (BID) | ORAL | 0 refills | Status: DC

## 2021-09-28 MED ORDER — KETOROLAC TROMETHAMINE 30 MG/ML IJ SOLN: 30.0000 mg | Freq: Once | INTRAMUSCULAR | Status: DC

## 2021-09-28 MED ORDER — CHLORZOXAZONE 500 MG PO TABS
500.0000 mg | ORAL_TABLET | Freq: Four times a day (QID) | ORAL | Status: DC | PRN
  Filled 2021-09-28: qty 1

## 2021-09-28 MED ORDER — GADOPICLENOL 0.5 MMOL/ML IV SOLN
7.0000 mL | Freq: Once | INTRAVENOUS | Status: AC | PRN
  Administered 2021-09-28: 7 mL via INTRAVENOUS

## 2021-09-28 MED ORDER — KETOROLAC TROMETHAMINE 15 MG/ML IJ SOLN
15.0000 mg | Freq: Four times a day (QID) | INTRAMUSCULAR | Status: DC | PRN
  Administered 2021-09-29: 15 mg via INTRAVENOUS
  Filled 2021-09-28: qty 1

## 2021-09-28 MED ORDER — PREDNISONE 20 MG PO TABS
60.0000 mg | ORAL_TABLET | Freq: Once | ORAL | Status: AC
  Administered 2021-09-28: 60 mg via ORAL
  Filled 2021-09-28: qty 3

## 2021-09-28 MED ORDER — LIDOCAINE 5 % EX PTCH: 1.0000 | MEDICATED_PATCH | CUTANEOUS | 0 refills | Status: DC

## 2021-09-28 MED ORDER — MORPHINE SULFATE (PF) 2 MG/ML IV SOLN: 2.0000 mg | INTRAVENOUS | Status: DC | PRN

## 2021-09-28 MED ORDER — SODIUM CHLORIDE 0.9 % IV SOLN
INTRAVENOUS | Status: DC
Start: 2021-09-28 — End: 2021-09-29

## 2021-09-28 MED ORDER — TRAZODONE HCL 50 MG PO TABS: 25.0000 mg | ORAL_TABLET | Freq: Every evening | ORAL | Status: DC | PRN

## 2021-09-28 MED ORDER — PANTOPRAZOLE SODIUM 40 MG PO TBEC
40.0000 mg | DELAYED_RELEASE_TABLET | Freq: Every day | ORAL | Status: DC
  Administered 2021-09-29 (×2): 40 mg via ORAL
  Filled 2021-09-28 (×2): qty 1

## 2021-09-28 NOTE — ED Provider Notes (Signed)
[facility] Provider Note    Event Date/Time   First MD Initiated Contact with Patient 09/28/21 1634     (approximate)   History   Chief Complaint Back Pain   HPI Mason Russell is a 36 y.o. male, history of scoliosis, obesity, lumbar radiculopathy, gout, presents to the emergency department for evaluation of right lower back/hip pain.  Patient states that has been going on for the past 3 days.  Denies any acute falls or injuries, though does state that he does a lot of heavy lifting at work, moving large amounts of food products.  Additionally endorses some sharp, shooting pain rating down his right lower extremity.  He does state that he was seen here approximately 3 months ago for similar symptoms, which required admission and work-up for possible septic arthritis.  Denies fever/chills, chest pain, shortness of breath, bowel or bladder dysfunction, saddle anesthesia, abdominal pain, flank pain, nausea/vomiting, diarrhea, dysuria, or dizziness/lightheadedness.  Per records review, his last admission for the symptoms was on 06/24/2021.  SI joint aspiration fluid per IR was negative.  Ultimately discharged on 06/30/2021 with prescription for prednisone, colchicine, and ibuprofen to treat for suspected inflammatory arthritis.  History Limitations: No limitations.        Physical Exam  Triage Vital Signs: ED Triage Vitals  Enc Vitals Group     BP 09/28/21 1604 (!) 141/99     Pulse Rate 09/28/21 1604 100     Resp 09/28/21 1604 18     Temp 09/28/21 1604 98.8 F (37.1 C)     Temp Source 09/28/21 1604 Oral     SpO2 09/28/21 1604 95 %     Weight 09/28/21 1608 175 lb (79.4 kg)     Height 09/28/21 1608 5\' 6"  (1.676 m)     Head Circumference --      Peak Flow --      Pain Score 09/28/21 1607 8     Pain Loc --      Pain Edu? --      Excl. in GC? --     Most recent vital signs: Vitals:   09/28/21 1604  BP: (!) 141/99  Pulse: 100  Resp: 18  Temp: 98.8  F (37.1 C)  SpO2: 95%    General: Awake, appears uncomfortable, but otherwise stable. Skin: Warm, dry. No rashes or lesions.  Eyes: PERRL. Conjunctivae normal.  CV: Good peripheral perfusion.  Resp: Normal effort.  Abd: Soft, non-tender. No distention.  Neuro: At baseline. No gross neurological deficits.  Musculoskeletal: Normal ROM of all extremities.   Focused Exam: Tenderness along the right buttocks.  No osseous tenderness along the pelvis, right hip, or femur.  Positive straight leg test, consistent with sciatica.  PMS intact distally.  He is unable to ambulate at this time.  Physical Exam    ED Results / Procedures / Treatments  Labs (all labs ordered are listed, but only abnormal results are displayed) Labs Reviewed  BASIC METABOLIC PANEL - Abnormal; Notable for the following components:      Result Value   Glucose, Bld 107 (*)    All other components within normal limits  CBC WITH DIFFERENTIAL/PLATELET     EKG N/A.   RADIOLOGY  ED Provider Interpretation: I personally viewed and interpreted this x-ray, no evidence of acute fractures, effusions, or dislocations.  DG Hip Unilat W or Wo Pelvis 2-3 Views Right  Result Date: 09/28/2021 CLINICAL DATA:  Hip pain EXAM: DG HIP (WITH OR WITHOUT  PELVIS) 2-3V RIGHT COMPARISON:  None Available. FINDINGS: There is no evidence of hip fracture or dislocation of the right hip. Frontal view of the left hip is unremarkable. No acute displaced fracture or diastasis of the bones of the pelvis. There is no evidence of arthropathy or other focal bone abnormality. IMPRESSION: Negative for acute traumatic injury. Electronically Signed   By: Tish Frederickson M.D.   On: 09/28/2021 18:09    PROCEDURES:  Critical Care performed: N/A.  Procedures    MEDICATIONS ORDERED IN ED: Medications  ketorolac (TORADOL) 30 MG/ML injection 30 mg (30 mg Intravenous Given 09/28/21 1713)  morphine (PF) 4 MG/ML injection 4 mg (4 mg Intravenous Given  09/28/21 1841)  predniSONE (DELTASONE) tablet 60 mg (60 mg Oral Given 09/28/21 2010)  HYDROmorphone (DILAUDID) injection 1 mg (1 mg Intravenous Given 09/28/21 2011)     IMPRESSION / MDM / ASSESSMENT AND PLAN / ED COURSE  I reviewed the triage vital signs and the nursing notes.                              Differential diagnosis includes, but is not limited to, lumbosacral strain, sciatica, septic joint, lumbar radiculopathy, disc herniation.  ED Course Patient appears uncomfortable, but clinically stable.  Vitals are within normal limits.  Afebrile.  CBC shows no leukocytosis or anemia.  BMP shows no AKI or electrolyte abnormalities.  Assessment/Plan Patient presents with right lower back/right hip/buttocks pain x3 days.  His physical exam is highly suggestive of lumbar radiculopathy/sciatica.  Prior MRI did show subtle disc desiccation and disc bulging along L4/L5 and L5/S1.  His lab work-up is reassuring.  No signs of infection.  Right hip x-rays not show any acute abnormalities or effusions.  Although originally prepped for discharge, he has still been unable to stand up or ambulate despite treatment with ketorolac and morphine.  He states he has family at home, however they are old and unable to take care of him.  I do not believe he will be able to accomplish daily functions at this point.  We will plan to admit for pain control.  Spoke with the on-call hospitalist, Dr. Arville Care, who recommended performing repeat MRIs to ensure no acute worsening of previous findings.  He agreed to admission.  We will admit.   Patient's presentation is most consistent with acute complicated illness / injury requiring diagnostic workup.       FINAL CLINICAL IMPRESSION(S) / ED DIAGNOSES   Final diagnoses:  Sciatica of right side     Rx / DC Orders   ED Discharge Orders          Ordered    cyclobenzaprine (FLEXERIL) 10 MG tablet  3 times daily PRN,   Status:  Discontinued        09/28/21 1659     naproxen (NAPROSYN) 500 MG tablet  2 times daily with meals,   Status:  Discontinued        09/28/21 1659    lidocaine (LIDODERM) 5 %  Every 24 hours,   Status:  Discontinued        09/28/21 1659    predniSONE (DELTASONE) 10 MG tablet  Q breakfast,   Status:  Discontinued        09/28/21 1819             Note:  This document was prepared using Dragon voice recognition software and may include unintentional dictation errors.   Lodema Hong,  Janne Napoleon, PA 09/28/21 2048    Naaman Plummer, MD 09/28/21 [phone]

## 2021-09-28 NOTE — ED Notes (Signed)
First Nurse Note: Patient to ED via [facility] for right sided flank pain since Monday. Patient state he has similar pain in June and had to get fluid removed.

## 2021-09-28 NOTE — Assessment & Plan Note (Addendum)
-   We will continue his PPI given steroid therapy and management with NSAIDs that was given earlier.

## 2021-09-28 NOTE — H&P (Signed)
[facility]   PATIENT NAME: Mason Russell    MR#:  [id number]  DATE OF BIRTH:  03-26-1985  DATE OF ADMISSION:  09/28/2021  PRIMARY CARE PHYSICIAN: System, Provider Not In   Patient is coming from: Home  REQUESTING/REFERRING PHYSICIAN: Charlsie Quest, PA-C  CHIEF COMPLAINT:   Chief Complaint  Patient presents with   Back Pain    HISTORY OF PRESENT ILLNESS:  Mason Russell is a 36 y.o. Hispanic male with medical history significant for gout and GERD, who presents emergency room with acute onset of acute right low back pain that started on Monday.  He stated that he works at [employer] and had been totally on Monday.  He admitted to lifting heavy meat products before his pain started.  His pain has been radiating down his right lower leg and felt like electric shocks sometimes.  He has been having subsequent difficulty with ambulation and in the ER he could not sit and stand without discomfort.  He had difficulty turning also in the bed because of pain.  He denied any right leg numbness or focal weakness.  No urinary or stool incontinence or perineal numbness.  No saddle paresthesias.  He denied any dysuria, oliguria or hematuria or flank pain.  No fever or chills.  No chest pain or dyspnea or cough.  No nausea or vomiting or abdominal pain.  Emergency room course: When he came to the ER, BP was 141/99 with otherwise normal vital signs.  Labs revealed borderline potassium and otherwise unremarkable BMP and CBC. EKG as reviewed by me : None Imaging: Right hip and pelvic x-ray was negative.  The patient was given 30 mg of IV Toradol, 1 mg IV Dilaudid, 4 mg of IV morphine sulfate and 60 mg of p.o. prednisone.  He will be admitted to a medical observation bed for further evaluation and management PAST MEDICAL HISTORY:   Past Medical History:  Diagnosis Date   Gout    Obesity with body mass index (BMI) of 30.0 to 39.9    Scoliosis     PAST SURGICAL HISTORY:   Past Surgical  History:  Procedure Laterality Date   NO PAST SURGERIES      SOCIAL HISTORY:   Social History   Tobacco Use   Smoking status: Former    Types: Cigarettes   Smokeless tobacco: Never  Substance Use Topics   Alcohol use: Yes    Comment: occasionally    FAMILY HISTORY:   Family History  Problem Relation Age of Onset   Cancer Mother     DRUG ALLERGIES:  No Known Allergies  REVIEW OF SYSTEMS:   ROS As per history of present illness. All pertinent systems were reviewed above. Constitutional, HEENT, cardiovascular, respiratory, GI, GU, musculoskeletal, neuro, psychiatric, endocrine, integumentary and hematologic systems were reviewed and are otherwise negative/unremarkable except for positive findings mentioned above in the HPI.   MEDICATIONS AT HOME:   Prior to Admission medications   Medication Sig Start Date End Date Taking? Authorizing Provider  colchicine 0.6 MG tablet Take 1 tablet (0.6 mg total) by mouth 2 (two) times daily for 14 days. 06/30/21 07/14/21  Darlin Drop, DO  Multiple Vitamin (MULTIVITAMIN WITH MINERALS) TABS tablet Take 1 tablet by mouth daily. Patient not taking: Reported on 09/28/2021 07/01/21 06/26/22  Darlin Drop, DO  pantoprazole (PROTONIX) 40 MG tablet Take 1 tablet (40 mg total) by mouth daily. Patient not taking: Reported on 09/28/2021 07/01/21 09/29/21  Darlin Drop,  DO      VITAL SIGNS:  Blood pressure (!) 132/105, pulse 100, temperature 97.9 F (36.6 C), temperature source Oral, resp. rate 18, height 5\' 6"  (1.676 m), weight 79.4 kg, SpO2 95 %.  PHYSICAL EXAMINATION:  Physical Exam  GENERAL:  36 y.o.-year-old Hispanic male patient lying in the bed with no acute distress.  EYES: Pupils equal, round, reactive to light and accommodation. No scleral icterus. Extraocular muscles intact.  HEENT: Head atraumatic, normocephalic. Oropharynx and nasopharynx clear.  NECK:  Supple, no jugular venous distention. No thyroid enlargement, no tenderness.   LUNGS: Normal breath sounds bilaterally, no wheezing, rales,rhonchi or crepitation. No use of accessory muscles of respiration.  CARDIOVASCULAR: Regular rate and rhythm, S1, S2 normal. No murmurs, rubs, or gallops.  ABDOMEN: Soft, nondistended, nontender. Bowel sounds present. No organomegaly or mass.  EXTREMITIES: No pedal edema, cyanosis, or clubbing.  NEUROLOGIC: Cranial nerves II through XII are intact. Muscle strength 5/5 in all extremities except over the right lower extremity more muscle strength.  3/5.30 Sensation intact. Gait not checked. Musculoskeletal: He had no spine tenderness in the LS spine.  He had right SI joint tenderness.  He had positive straight leg raising test.  He was having difficulty with ambulation earlier. PSYCHIATRIC: The patient is alert and oriented x 3.  Normal affect and good eye contact. SKIN: No obvious rash, lesion, or ulcer.   LABORATORY PANEL:   CBC Recent Labs  Lab 09/28/21 1708  WBC 9.5  HGB 14.7  HCT 45.5  PLT 314   ------------------------------------------------------------------------------------------------------------------  Chemistries  Recent Labs  Lab 09/28/21 1708  NA 139  K 3.5  CL 104  CO2 25  GLUCOSE 107*  BUN 8  CREATININE 0.73  CALCIUM 9.4   ------------------------------------------------------------------------------------------------------------------  Cardiac Enzymes No results for input(s): "TROPONINI" in the last 168 hours. ------------------------------------------------------------------------------------------------------------------  RADIOLOGY:  DG Hip Unilat W or Wo Pelvis 2-3 Views Right  Result Date: 09/28/2021 CLINICAL DATA:  Hip pain EXAM: DG HIP (WITH OR WITHOUT PELVIS) 2-3V RIGHT COMPARISON:  None Available. FINDINGS: There is no evidence of hip fracture or dislocation of the right hip. Frontal view of the left hip is unremarkable. No acute displaced fracture or diastasis of the bones of the pelvis.  There is no evidence of arthropathy or other focal bone abnormality. IMPRESSION: Negative for acute traumatic injury. Electronically Signed   By: 09/30/2021 M.D.   On: 09/28/2021 18:09      IMPRESSION AND PLAN:  Assessment and Plan: * Unable to ambulate - This is associated with right low back pain with right lower extremity radiculopathy concerning for sciatica.  This came after heavy weightlifting at work. - The patient be admitted to an observation medical bed. - Pain management will be provided. - Muscle relaxants and warm compress as well as steroid therapy will be given. - We will obtain a lumbosacral MRI for further assessment for disc injury. -Depending on results, neurosurgery consult can be pursued if he has significant findings.  Gout He has used as needed colchicine in the past.  No current acute attack.  GERD without esophagitis - We will continue his PPI given steroid therapy and management with NSAIDs that was given earlier.       DVT prophylaxis: Lovenox.  Advanced Care Planning:  Code Status: full code.  Family Communication:  The plan of care was discussed in details with the patient (and family). I answered all questions. The patient agreed to proceed with the above mentioned plan. Further  management will depend upon hospital course. Disposition Plan: Back to previous home environment Consults called: none.  All the records are reviewed and case discussed with ED provider.  Status is: Observation   I certify that at the time of admission, it is my clinical judgment that the patient will require  hospital care extending less than 2 midnights.                            Dispo: The patient is from: Home              Anticipated d/c is to: Home              Patient currently is not medically stable to d/c.              Difficult to place patient: No  Christel Mormon M.D on 09/28/2021 at 10:37 PM  [facility]   From 7 PM-7 AM, contact  night-coverage [email or web address]  CC: Primary care physician; System, Provider Not In

## 2021-09-28 NOTE — Assessment & Plan Note (Signed)
He has used as needed colchicine in the past.  No current acute attack.

## 2021-09-28 NOTE — ED Notes (Addendum)
This RN to bedside to obtain full set of vital signs but pt nor stretcher present; pt to C-pod.

## 2021-09-28 NOTE — Assessment & Plan Note (Signed)
-   This is associated with right low back pain with right lower extremity radiculopathy concerning for sciatica.  This came after heavy weightlifting at work. - The patient be admitted to an observation medical bed. - Pain management will be provided. - Muscle relaxants and warm compress as well as steroid therapy will be given. - We will obtain a lumbosacral MRI for further assessment for disc injury. -Depending on results, neurosurgery consult can be pursued if he has significant findings.

## 2021-09-28 NOTE — ED Triage Notes (Signed)
Pt to ER with c/o right lower back pain since Monday.  Pt was seen here about 3 months ago with same symptoms.  Pt states he does heavy lifting at work.  Denies urinary symptoms.

## 2021-09-29 ENCOUNTER — Other Ambulatory Visit: Payer: Self-pay

## 2021-09-29 DIAGNOSIS — M5416 Radiculopathy, lumbar region: Secondary | ICD-10-CM

## 2021-09-29 DIAGNOSIS — M1990 Unspecified osteoarthritis, unspecified site: Secondary | ICD-10-CM

## 2021-09-29 LAB — CBC
HCT: 43.5 % (ref 39.0–52.0)
Hemoglobin: 13.9 g/dL (ref 13.0–17.0)
MCH: 29.9 pg (ref 26.0–34.0)
MCHC: 32 g/dL (ref 30.0–36.0)
MCV: 93.5 fL (ref 80.0–100.0)
Platelets: 325 10*3/uL (ref 150–400)
RBC: 4.65 MIL/uL (ref 4.22–5.81)
RDW: 13.3 % (ref 11.5–15.5)
WBC: 8 10*3/uL (ref 4.0–10.5)
nRBC: 0 % (ref 0.0–0.2)

## 2021-09-29 MED ORDER — LIDOCAINE 5 % EX PTCH
1.0000 | MEDICATED_PATCH | CUTANEOUS | Status: DC
  Administered 2021-09-29: 1 via TRANSDERMAL
  Filled 2021-09-29: qty 1

## 2021-09-29 MED ORDER — IBUPROFEN 800 MG PO TABS
800.0000 mg | ORAL_TABLET | Freq: Three times a day (TID) | ORAL | 0 refills | Status: DC | PRN
  Filled 2021-09-29: qty 30, 10d supply, fill #0

## 2021-09-29 MED ORDER — OXYCODONE-ACETAMINOPHEN 5-325 MG PO TABS: 1.0000 | ORAL_TABLET | Freq: Three times a day (TID) | ORAL | 0 refills | Status: DC | PRN

## 2021-09-29 MED ORDER — IBUPROFEN 800 MG PO TABS: 800.0000 mg | ORAL_TABLET | Freq: Three times a day (TID) | ORAL | 0 refills | Status: DC | PRN

## 2021-09-29 MED ORDER — DEXAMETHASONE 6 MG PO TABS: 6.0000 mg | ORAL_TABLET | Freq: Every day | ORAL | 0 refills | Status: DC

## 2021-09-29 MED ORDER — DEXAMETHASONE 4 MG PO TABS
6.0000 mg | ORAL_TABLET | Freq: Every day | ORAL | 0 refills | Status: AC
  Filled 2021-09-29: qty 8, 5d supply, fill #0

## 2021-09-29 MED ORDER — LIDOCAINE 5 % EX PTCH: 1.0000 | MEDICATED_PATCH | CUTANEOUS | 0 refills | Status: DC

## 2021-09-29 MED ORDER — PANTOPRAZOLE SODIUM 40 MG PO TBEC: 40.0000 mg | DELAYED_RELEASE_TABLET | Freq: Every day | ORAL | 0 refills | Status: DC

## 2021-09-29 MED ORDER — PANTOPRAZOLE SODIUM 40 MG PO TBEC
40.0000 mg | DELAYED_RELEASE_TABLET | Freq: Every day | ORAL | 0 refills | Status: DC
  Filled 2021-09-29: qty 30, 30d supply, fill #0

## 2021-09-29 MED ORDER — INFLUENZA VAC SPLIT QUAD 0.5 ML IM SUSY
0.5000 mL | PREFILLED_SYRINGE | Freq: Once | INTRAMUSCULAR | Status: AC
  Administered 2021-09-29: 0.5 mL via INTRAMUSCULAR
  Filled 2021-09-29: qty 0.5

## 2021-09-29 MED ORDER — LIDOCAINE 5 % EX PTCH
1.0000 | MEDICATED_PATCH | CUTANEOUS | 0 refills | Status: DC
  Filled 2021-09-29: qty 7, 7d supply, fill #0

## 2021-09-29 MED ORDER — INFLUENZA VAC SPLIT QUAD 0.5 ML IM SUSY: 0.5000 mL | PREFILLED_SYRINGE | INTRAMUSCULAR | Status: DC

## 2021-09-29 NOTE — TOC Progression Note (Signed)
Transition of Care [facility]) - Progression Note    Patient Details  Name: Mason Russell MRN: [id number] Date of Birth: 1985-11-17  [facility] CM/SW [place], RN Phone Number: 09/29/2021, 1:30 PM  Clinical Narrative:    Met with the patient and provided him with [facility], I explained to call them to get the application appointment He stated understanding        Expected Discharge Plan and Services           Expected Discharge Date: 09/29/21                                     Social Determinants of Health (SDOH) Interventions    Readmission Risk Interventions     No data to display

## 2021-09-29 NOTE — Evaluation (Signed)
Physical Therapy Evaluation Patient Details Name: Mason Russell MRN: [id number] DOB: 29-Jun-1985 Today's Date: 09/29/2021  History of Present Illness  Mason Russell is a 36yoM who comes to [facility] with severe progression in back/leg pain, no longer able to walk at home, did report doing heavy lifting at work. Pt with PMH of similiar complaints 3 months ago.   Clinical Impression  Patient alert, agreeable to PT. Reported 6/10 back pain. He stated at baseline he is independent/modI, works part time, uses crutches as needed depending on his back pain that day. Lives with extended family, someone available near 24/7.  He was able to perform bed mobility modI, extended time for RLE management and pain. Sit <> Stand with AC, supervision. He ambulated ~138ft with supervision and 4 point gait pattern with crutches, no LOB, limited stride length but overall stated it was improved from admission. Returned to room and instructed in piriformis stretch and hamstring stretch, verbalized and demonstrated understanding.  Overall the patient demonstrated deficits (see "PT Problem List") that impede the patient's functional abilities, safety, and mobility and would benefit from skilled PT intervention. Recommendation at this time is outpt PT to maximize function, HEP instruction, body mechanics assessment for lifting, education.        Recommendations for follow up therapy are one component of a multi-disciplinary discharge planning process, led by the attending physician.  Recommendations may be updated based on patient status, additional functional criteria and insurance authorization.  Follow Up Recommendations Outpatient PT      Assistance Recommended at Discharge Frequent or constant Supervision/Assistance  Patient can return home with the following  A little help with walking and/or transfers;A little help with bathing/dressing/bathroom;Assistance with cooking/housework;Assist for transportation;Help with  stairs or ramp for entrance    Equipment Recommendations None recommended by PT  Recommendations for Other Services       Functional Status Assessment Patient has had a recent decline in their functional status and demonstrates the ability to make significant improvements in function in a reasonable and predictable amount of time.     Precautions / Restrictions Precautions Precautions: Fall Restrictions Weight Bearing Restrictions: No      Mobility  Bed Mobility Overal bed mobility: Modified Independent Bed Mobility: Supine to Sit, Sit to Supine                Transfers Overall transfer level: Needs assistance Equipment used: Crutches Transfers: Sit to/from Stand Sit to Stand: Supervision                Ambulation/Gait Ambulation/Gait assistance: Supervision Gait Distance (Feet): 100 Feet Assistive device: Crutches         General Gait Details: 4 point gait pattern, very slow but no LOB, no physical assistance  Stairs            Wheelchair Mobility    Modified Rankin (Stroke Patients Only)       Balance Overall balance assessment: Needs assistance Sitting-balance support: Feet supported Sitting balance-Leahy Scale: Good       Standing balance-Leahy Scale: Fair                               Pertinent Vitals/Pain Pain Assessment Pain Assessment: 0-10 Pain Score: 6  Pain Location: R hip/back pain Pain Descriptors / Indicators: Aching, Grimacing, Guarding, Sore Pain Intervention(s): Limited activity within patient's tolerance, Monitored during session, Repositioned, Premedicated before session    Home Living Family/patient expects to be discharged  to:: Private residence Living Arrangements: Parent;Other relatives (sister, 47 yo nephew) Available Help at Discharge: Family;Available 24 hours/day Type of Home: Apartment Home Access: Stairs to enter Entrance Stairs-Rails: None (uses door frame to hold on to) [employer] of Steps: 1 + 1   Home Layout: One level Home Equipment: Crutches      Prior Function Prior Level of Function : Independent/Modified Independent             Mobility Comments: Pt has been working at [employer], driving, using crutches occassionally when has joint pain. ADLs Comments: IND in ADL/IADL     Hand Dominance        Extremity/Trunk Assessment   Upper Extremity Assessment Upper Extremity Assessment: Overall WFL for tasks assessed    Lower Extremity Assessment Lower Extremity Assessment: Generalized weakness (limited by L knee pain and R hip/back pain)    Cervical / Trunk Assessment Cervical / Trunk Assessment: Normal  Communication   Communication: No difficulties  Cognition Arousal/Alertness: Awake/alert Behavior During Therapy: WFL for tasks assessed/performed Overall Cognitive Status: Within Functional Limits for tasks assessed                                          General Comments      Exercises Other Exercises Other Exercises: instructed in seated hamstring stretch and seated piriformis stretch, demonstrated understanding and able to perform   Assessment/Plan    PT Assessment Patient needs continued PT services  PT Problem List Decreased strength;Decreased mobility;Decreased range of motion;Decreased coordination;Decreased activity tolerance;Decreased balance;Decreased knowledge of use of DME       PT Treatment Interventions DME instruction;Therapeutic exercise;Gait training;Balance training;Stair training;Neuromuscular re-education;Functional mobility training;Therapeutic activities;Patient/family education    PT Goals (Current goals can be found in the Care Plan section)  Acute Rehab PT Goals Patient Stated Goal: to have less pain PT Goal Formulation: With patient Time For Goal Achievement: 10/13/21 Potential to Achieve Goals: Good    Frequency Min 2X/week     Co-evaluation               AM-PAC  PT "6 Clicks" Mobility  Outcome Measure Help needed turning from your back to your side while in a flat bed without using bedrails?: A Little Help needed moving from lying on your back to sitting on the side of a flat bed without using bedrails?: A Little Help needed moving to and from a bed to a chair (including a wheelchair)?: A Little Help needed standing up from a chair using your arms (e.g., wheelchair or bedside chair)?: A Little Help needed to walk in hospital room?: A Little Help needed climbing 3-5 steps with a railing? : A Little 6 Click Score: 18    End of Session   Activity Tolerance: Patient tolerated treatment well (did have back pain throughout) Patient left: in bed;with call bell/phone within reach;with bed alarm set Nurse Communication: Mobility status PT Visit Diagnosis: Other abnormalities of gait and mobility (R26.89);Difficulty in walking, not elsewhere classified (R26.2);Muscle weakness (generalized) (M62.81)    Time: 8250-5397 PT Time Calculation (min) (ACUTE ONLY): 25 min   Charges:   PT Evaluation $PT Eval Low Complexity: 1 Low PT Treatments $Therapeutic Activity: 8-22 mins       Olga Coaster PT, DPT 12:15 PM,09/29/21

## 2021-09-29 NOTE — Discharge Summary (Signed)
Physician Discharge Summary   Patient: Mason Russell MRN: [id number] DOB: 01-14-1985  Admit date:     09/28/2021  Discharge date: 09/29/21  Discharge Physician: Marrion Coyekui Deshia Vanderhoof   PCP: System, Provider Not In   Recommendations at discharge:   Follow-up with rheumatology in 1 month. TOC to set up PCP.  Discharge Diagnoses: Principal Problem:   Unable to ambulate Active Problems:   Acute lumbar radiculopathy   Gout   Inflammatory arthritis   GERD without esophagitis   Sciatica of right side Avascular necrosis of bilateral femoral heads. Anterior superior lateral tear of right hip. Resolved Problems:   * No resolved hospital problems. *  Hospital Course: Mason Russell is a 36 y.o. Hispanic male with medical history significant for gout and GERD, recent diagnosis of inflammatory arthritis, Who presents emergency room with acute onset of acute right low back pain that started on Monday. MRI showed right sacroiliac joint, with increased periarticular marrow and adjacent soft tissue edema, Avascular necrosis of the bilateral femoral heads. Anterior superior labral tear of the right hip at the chondrolabral junction. The main presentation was a sciatica made him not able to walk. He was given steroids, pain medicine.  Pain is better.  Patient was also seen by physical therapy, was able to walk better.  Currently he is medically stable to be discharged.  Assessment and Plan: Acute lumbar radiculopathy. Sciatica, right-sided [facility] Reviewed MRI results of lumbar spine, patient has changes in the sacroiliac joint inflammation and marrow edema.  Patient had a same presentation and MRI changes in June 2023, at that time, patient also had joint effusion.  He was seen by ID and neurosurgery, deemed to be secondary to inflammation arthritis of SI joint.  He was treated with steroids, condition has been much better since then.  He has not seen by rheumatology as  recommended at the last discharge, will refer to Dr. Allena KatzPatel again at this time. Discussed with Dr. Myer HaffYarbrough again today, who has reviewed patient MRI images, patient does not need surgery at this time.  He will be followed by him in the office in the near future. Patient is evaluated by physical therapy, he is able to walk better today.  We will set up outpatient physical therapy.   Avascular necrosis of bilateral femoral heads. Anterior superior lateral tear of right hip. Discussed with Dr. Theone StanleyKasinsky today, he also reviewed patient MRI results and images.  Patient does not need surgery, patient will follow up with him in the near future in the office.  Gout. No acute attack.  Gastroesophageal reflux without esophagitis  Continue PPI.       Consultants: None Procedures performed: None  Disposition: Home health Diet recommendation:  Discharge Diet Orders (From admission, onward)     Start     Ordered   09/29/21 0000  Diet - low sodium heart healthy        09/29/21 1149           Cardiac diet DISCHARGE MEDICATION: Allergies as of 09/29/2021   No Known Allergies      Medication List     STOP taking these medications    multivitamin with minerals Tabs tablet   pantoprazole 40 MG tablet Commonly known as: PROTONIX       TAKE these medications    colchicine 0.6 MG tablet Take 1 tablet (0.6 mg total) by mouth 2 (two) times daily for 14 days.   dexamethasone 6 MG tablet Commonly known as:  DECADRON Take 1 tablet (6 mg total) by mouth daily for 5 days.   ibuprofen 800 MG tablet Commonly known as: ADVIL Take 1 tablet (800 mg total) by mouth every 8 (eight) hours as needed.   lidocaine 5 % Commonly known as: LIDODERM Place 1 patch onto the skin daily. Remove & Discard patch within 12 hours or as directed by MD   oxyCODONE-acetaminophen 5-325 MG tablet Commonly known as: Percocet Take 1 tablet by mouth every 8 (eight) hours as needed for severe pain.         Follow-up Information     Allena Katz, Mayur K, MD Follow up in 1 month(s).   Specialty: Rheumatology Contact information: [address] [place] [address] [phone]                Discharge Exam: Ceasar Mons Weights   09/28/21 1608  Weight: 79.4 kg   General exam: Appears calm and comfortable  Respiratory system: Clear to auscultation. Respiratory effort normal. Cardiovascular system: S1 & S2 heard, RRR. No JVD, murmurs, rubs, gallops or clicks. No pedal edema. Gastrointestinal system: Abdomen is nondistended, soft and nontender. No organomegaly or masses felt. Normal bowel sounds heard. Central nervous system: Alert and oriented. No focal neurological deficits. Extremities: Symmetric 5 x 5 power. Skin: No rashes, lesions or ulcers Psychiatry: Judgement and insight appear normal. Mood & affect appropriate.    Condition at discharge: fair  The results of significant diagnostics from this hospitalization (including imaging, microbiology, ancillary and laboratory) are listed below for reference.   Imaging Studies: MR HIP RIGHT W WO CONTRAST  Result Date: 09/29/2021 CLINICAL DATA:  Septic arthritis suspected, hip, xray done; Hip pain, osteonecrosis suspected, xray done suspected avascular necrosis EXAM: MRI OF THE RIGHT AND LEFT HIP WITHOUT AND WITH CONTRAST TECHNIQUE: Multiplanar, multisequence MR imaging was performed both before and after administration of intravenous contrast. CONTRAST:  7 mL vueway COMPARISON:  MRI 06/24/2021 FINDINGS: Bones: Avascular necrosis of the femoral heads, left greater than right. No evidence of articular surface collapse. Degree of AVN is unchanged since prior MRI in June. No acute fracture. Persistent right sacroiliac joint effusion with increased, predominantly iliac sided mild periarticular marrow edema. No definite bony erosion. There is right SI joint pericapsular edema and adjacent soft tissue edema. Mild periarticular low T1 marrow  signal in the right. The left SI joint is unremarkable. The pubic symphysis is unremarkable. Degenerative disc disease at L4-L5 and L5-S1, partially visualized, see separately dictated lumbar spine MRI. Articular cartilage and labrum Articular cartilage: No focal chondral defect or subchondral signal abnormality identified. Labrum: Anterior superior labral tear on the right at the chondrolabral junction. No evidence of left hip labral tear. Joint or bursal effusion Joint effusion: No significant hip joint effusion. Bursae: No evidence of trochanteric bursitis. Muscles and tendons Muscles and tendons: The gluteal tendons are intact. The proximal hamstrings are intact.There is intramuscular edema within the right piriformis, increased from the prior exam, and persistent intramuscular edema within the deep fibers of the right iliacus muscle and associated soft tissue enhancement. Other findings Miscellaneous: Fluid tracks along the superficial aspect of the right iliacus muscle along the right pelvic sidewall, similar to prior exam. No well-defined/drainable fluid collection. No lymphadenopathy. IMPRESSION: Findings are suspicious for septic arthritis of the right sacroiliac joint, with increased periarticular marrow and adjacent soft tissue edema in comparison to prior exam. No evidence of soft tissue abscess. Avascular necrosis of the bilateral femoral heads, left greater than right. No evidence of  articular surface collapse. Degree of AVN is unchanged since the prior MRI in June. No evidence of septic arthritis involving the hip joints. Anterior superior labral tear of the right hip at the chondrolabral junction. Electronically Signed   By: Caprice Renshaw M.D.   On: 09/29/2021 08:10   MR HIP LEFT W WO CONTRAST  Result Date: 09/29/2021 CLINICAL DATA:  Septic arthritis suspected, hip, xray done; Hip pain, osteonecrosis suspected, xray done suspected avascular necrosis EXAM: MRI OF THE RIGHT AND LEFT HIP WITHOUT AND  WITH CONTRAST TECHNIQUE: Multiplanar, multisequence MR imaging was performed both before and after administration of intravenous contrast. CONTRAST:  7 mL vueway COMPARISON:  MRI 06/24/2021 FINDINGS: Bones: Avascular necrosis of the femoral heads, left greater than right. No evidence of articular surface collapse. Degree of AVN is unchanged since prior MRI in June. No acute fracture. Persistent right sacroiliac joint effusion with increased, predominantly iliac sided mild periarticular marrow edema. No definite bony erosion. There is right SI joint pericapsular edema and adjacent soft tissue edema. Mild periarticular low T1 marrow signal in the right. The left SI joint is unremarkable. The pubic symphysis is unremarkable. Degenerative disc disease at L4-L5 and L5-S1, partially visualized, see separately dictated lumbar spine MRI. Articular cartilage and labrum Articular cartilage: No focal chondral defect or subchondral signal abnormality identified. Labrum: Anterior superior labral tear on the right at the chondrolabral junction. No evidence of left hip labral tear. Joint or bursal effusion Joint effusion: No significant hip joint effusion. Bursae: No evidence of trochanteric bursitis. Muscles and tendons Muscles and tendons: The gluteal tendons are intact. The proximal hamstrings are intact.There is intramuscular edema within the right piriformis, increased from the prior exam, and persistent intramuscular edema within the deep fibers of the right iliacus muscle and associated soft tissue enhancement. Other findings Miscellaneous: Fluid tracks along the superficial aspect of the right iliacus muscle along the right pelvic sidewall, similar to prior exam. No well-defined/drainable fluid collection. No lymphadenopathy. IMPRESSION: Findings are suspicious for septic arthritis of the right sacroiliac joint, with increased periarticular marrow and adjacent soft tissue edema in comparison to prior exam. No evidence of  soft tissue abscess. Avascular necrosis of the bilateral femoral heads, left greater than right. No evidence of articular surface collapse. Degree of AVN is unchanged since the prior MRI in June. No evidence of septic arthritis involving the hip joints. Anterior superior labral tear of the right hip at the chondrolabral junction. Electronically Signed   By: Caprice Renshaw M.D.   On: 09/29/2021 08:10   MR LUMBAR SPINE WO CONTRAST  Result Date: 09/28/2021 CLINICAL DATA:  Low back pain. EXAM: MRI LUMBAR SPINE WITHOUT CONTRAST TECHNIQUE: Multiplanar, multisequence MR imaging of the lumbar spine was performed. No intravenous contrast was administered. COMPARISON:  None Available. FINDINGS: Segmentation:   5 non rib-bearing lumbar type vertebral bodies are present. The lowest fully formed vertebral body is L5. Alignment:  Straightening of the lumbar spine. Vertebrae:  No fracture, evidence of discitis, or bone lesion. Conus medullaris and cauda equina: Conus extends to the L1 level. Conus and cauda equina appear normal. Paraspinal and other soft tissues: Negative. Disc levels: T12-L1: No significant disc bulge. No neural foraminal stenosis. No central canal stenosis. L1-L2: No significant disc bulge. No neural foraminal stenosis. No central canal stenosis. L2-L3: No significant disc bulge. No neural foraminal stenosis. No central canal stenosis. L3-L4: No significant disc bulge. No neural foraminal stenosis. No central canal stenosis. L4-L5: Disc desiccation with mild disc bulge and  mild narrowing of lateral recesses bilaterally. No neural foraminal stenosis. No central canal stenosis. L5-S1: Disc desiccation and disc protrusion with moderate bilateral lateral recess stenosis. Mild facet joint arthropathy. Mild bilateral neural foraminal stenosis. IMPRESSION: 1.  No evidence of fracture or subluxation. 2.  Distal cord and cauda equina are within normal limits. 3.  Degenerate disc disease at L4-L5 and L5-S1. 4. Disc  protrusion with moderate bilateral recess stenosis at L5-S1 with mild bilateral neural foraminal stenosis. Facet joint arthropathy at L4-L5 and L5-S1. Electronically Signed   By: Keane Police D.O.   On: 09/28/2021 23:52   DG Hip Unilat W or Wo Pelvis 2-3 Views Right  Result Date: 09/28/2021 CLINICAL DATA:  Hip pain EXAM: DG HIP (WITH OR WITHOUT PELVIS) 2-3V RIGHT COMPARISON:  None Available. FINDINGS: There is no evidence of hip fracture or dislocation of the right hip. Frontal view of the left hip is unremarkable. No acute displaced fracture or diastasis of the bones of the pelvis. There is no evidence of arthropathy or other focal bone abnormality. IMPRESSION: Negative for acute traumatic injury. Electronically Signed   By: Iven Finn M.D.   On: 09/28/2021 18:09    Microbiology: Results for orders placed or performed during the hospital encounter of 06/24/21  Urine Culture     Status: Abnormal   Collection Time: 06/24/21  5:45 AM   Specimen: Urine, Clean Catch  Result Value Ref Range Status   Specimen Description   Final    URINE, CLEAN CATCH Performed at [facility], [address], [place], [place] [address]    Special Requests   Final    NONE Performed at [facility], [address], [place], [place] [address]    Culture (A)  Final    <10,000 COLONIES/mL INSIGNIFICANT GROWTH Performed at [facility], [place] [address], [place], [place] [address]    Report Status 06/25/2021 FINAL  Final  Blood culture (routine x 2)     Status: None   Collection Time: 06/24/21  5:45 AM   Specimen: BLOOD  Result Value Ref Range Status   Specimen Description BLOOD RIGHT HAND  Final   Special Requests   Final    BOTTLES DRAWN AEROBIC AND ANAEROBIC Blood Culture results may not be optimal due to an excessive volume of blood received in culture bottles   Culture   Final    NO GROWTH 5 DAYS Performed at [facility], [address], [place], [place] [address]     Report Status 06/29/2021 FINAL  Final  Blood culture (routine x 2)     Status: None   Collection Time: 06/24/21  5:45 AM   Specimen: BLOOD  Result Value Ref Range Status   Specimen Description BLOOD LEFT AC  Final   Special Requests   Final    BOTTLES DRAWN AEROBIC AND ANAEROBIC Blood Culture results may not be optimal due to an excessive volume of blood received in culture bottles   Culture   Final    NO GROWTH 5 DAYS Performed at [facility], [address] [place],  [address]    Report Status 06/29/2021 FINAL  Final  SARS Coronavirus 2 by RT PCR (hospital order, performed in [facility] hospital lab) *cepheid single result test* Anterior Nasal Swab     Status: None   Collection Time: 06/24/21  5:45 AM   Specimen: Anterior Nasal Swab  Result Value Ref Range Status   SARS Coronavirus 2 by RT PCR  NEGATIVE NEGATIVE Final    Comment: (NOTE) SARS-CoV-2 target nucleic acids are NOT DETECTED.  The SARS-CoV-2 RNA is generally detectable in upper and lower respiratory specimens during the acute phase of infection. The lowest concentration of SARS-CoV-2 viral copies this assay can detect is 250 copies / mL. A negative result does not preclude SARS-CoV-2 infection and should not be used as the sole basis for treatment or other patient management decisions.  A negative result may occur with improper specimen collection / handling, submission of specimen other than nasopharyngeal swab, presence of viral mutation(s) within the areas targeted by this assay, and inadequate number of viral copies (<250 copies / mL). A negative result must be combined with clinical observations, patient history, and epidemiological information.  Fact Sheet for Patients:   RoadLapTop.co.za  Fact Sheet for Healthcare Providers: http://kim-miller.com/  This test is not yet approved or  cleared by the [place] FDA and has been authorized for  detection and/or diagnosis of SARS-CoV-2 by FDA under an Emergency Use Authorization (EUA).  This EUA will remain in effect (meaning this test can be used) for the duration of the COVID-19 declaration under Section 564(b)(1) of the Act, 21 U.S.C. section 360bbb-3(b)(1), unless the authorization is terminated or revoked sooner.  Performed at [facility], [address] [place], [place] [address]   Aerobic/Anaerobic Culture w Gram Stain (surgical/deep wound)     Status: None   Collection Time: 06/26/21 12:59 PM   Specimen: Synovium; Synovial Fluid  Result Value Ref Range Status   Specimen Description   Final    SYNOVIAL Performed at [facility], [address] [place], [place] [address]    Special Requests   Final    RIGHT SACROLIIATE JOINT ASPIRATE Performed at [facility], [address] [place], [place] [address]    Gram Stain   Final    ABUNDANT WBC PRESENT,BOTH PMN AND MONONUCLEAR NO ORGANISMS SEEN    Culture   Final    No growth aerobically or anaerobically. Performed at [facility], [address], [place], [place] [address]    Report Status 07/01/2021 FINAL  Final  Chlamydia/NGC rt PCR ([facility] only)     Status: None   Collection Time: 06/29/21 10:28 AM   Specimen: Urine  Result Value Ref Range Status   Specimen source GC/Chlam ENDOCERVICAL  Final   Chlamydia Tr NOT DETECTED NOT DETECTED Final   N gonorrhoeae NOT DETECTED NOT DETECTED Final    Comment: (NOTE) This CT/NG assay has not been evaluated in patients with a history of  hysterectomy. Performed at [facility], [address] [place], [place] [address]     Labs: CBC: Recent Labs  Lab 09/28/21 1708 09/29/21 0721  WBC 9.5 8.0  NEUTROABS 6.8  --   HGB 14.7 13.9  HCT 45.5 43.5  MCV 93.6 93.5  PLT 314 325   Basic Metabolic Panel: Recent Labs  Lab 09/28/21 1708  NA 139  K 3.5  CL 104  CO2 25  GLUCOSE 107*  BUN 8  CREATININE 0.73  CALCIUM  9.4   Liver Function Tests: No results for input(s): "AST", "ALT", "ALKPHOS", "BILITOT", "PROT", "ALBUMIN" in the last 168 hours. CBG: No results for input(s): "GLUCAP" in the last 168 hours.  Discharge time spent: greater than 30 minutes.  Signed: Marrion Coy, MD [facility] 09/29/2021

## 2021-10-11 ENCOUNTER — Telehealth: Payer: Self-pay | Admitting: Neurosurgery

## 2021-10-11 NOTE — Telephone Encounter (Signed)
From: Sharen Hones, MD  Sent: 09/29/2021   1:05 PM EDT  To: Meade Maw, MD  F/u with Geronimo Boot PA  I have attempted to contact the patient 3 times since 09/29/2021 to present 10/11/2021. Left message every time. Patient has not responded to my call. If patient calls back he needs to schedule an appt with Geronimo Boot PA.

## 2022-08-03 ENCOUNTER — Emergency Department: Payer: Self-pay

## 2022-08-03 ENCOUNTER — Inpatient Hospital Stay
Admission: EM | Admit: 2022-08-03 | Discharge: 2022-08-10 | DRG: 690 | Disposition: A | Discharge status: Home / Self Care | Payer: Self-pay | Attending: Internal Medicine | Admitting: Internal Medicine

## 2022-08-03 ENCOUNTER — Other Ambulatory Visit: Payer: Self-pay

## 2022-08-03 DIAGNOSIS — T39395A Adverse effect of other nonsteroidal anti-inflammatory drugs [NSAID], initial encounter: Secondary | ICD-10-CM | POA: Diagnosis present

## 2022-08-03 DIAGNOSIS — E876 Hypokalemia: Secondary | ICD-10-CM

## 2022-08-03 DIAGNOSIS — R809 Proteinuria, unspecified: Secondary | ICD-10-CM | POA: Diagnosis present

## 2022-08-03 DIAGNOSIS — E872 Acidosis, unspecified: Secondary | ICD-10-CM | POA: Diagnosis present

## 2022-08-03 DIAGNOSIS — Z1611 Resistance to penicillins: Secondary | ICD-10-CM | POA: Diagnosis present

## 2022-08-03 DIAGNOSIS — R7303 Prediabetes: Secondary | ICD-10-CM | POA: Diagnosis present

## 2022-08-03 DIAGNOSIS — N12 Tubulo-interstitial nephritis, not specified as acute or chronic: Secondary | ICD-10-CM

## 2022-08-03 DIAGNOSIS — M25561 Pain in right knee: Secondary | ICD-10-CM | POA: Diagnosis present

## 2022-08-03 DIAGNOSIS — N189 Chronic kidney disease, unspecified: Secondary | ICD-10-CM | POA: Diagnosis present

## 2022-08-03 DIAGNOSIS — M87852 Other osteonecrosis, left femur: Secondary | ICD-10-CM | POA: Diagnosis present

## 2022-08-03 DIAGNOSIS — M87851 Other osteonecrosis, right femur: Secondary | ICD-10-CM | POA: Diagnosis present

## 2022-08-03 DIAGNOSIS — M419 Scoliosis, unspecified: Secondary | ICD-10-CM | POA: Diagnosis present

## 2022-08-03 DIAGNOSIS — Z597 Insufficient social insurance and welfare support: Secondary | ICD-10-CM

## 2022-08-03 DIAGNOSIS — R3129 Other microscopic hematuria: Secondary | ICD-10-CM | POA: Diagnosis present

## 2022-08-03 DIAGNOSIS — R34 Anuria and oliguria: Secondary | ICD-10-CM | POA: Diagnosis present

## 2022-08-03 DIAGNOSIS — E8721 Acute metabolic acidosis: Secondary | ICD-10-CM | POA: Diagnosis present

## 2022-08-03 DIAGNOSIS — M25562 Pain in left knee: Secondary | ICD-10-CM | POA: Diagnosis present

## 2022-08-03 DIAGNOSIS — Z8739 Personal history of other diseases of the musculoskeletal system and connective tissue: Secondary | ICD-10-CM

## 2022-08-03 DIAGNOSIS — Z79899 Other long term (current) drug therapy: Secondary | ICD-10-CM

## 2022-08-03 DIAGNOSIS — R7881 Bacteremia: Secondary | ICD-10-CM | POA: Diagnosis present

## 2022-08-03 DIAGNOSIS — M549 Dorsalgia, unspecified: Secondary | ICD-10-CM | POA: Insufficient documentation

## 2022-08-03 DIAGNOSIS — E870 Hyperosmolality and hypernatremia: Secondary | ICD-10-CM | POA: Diagnosis present

## 2022-08-03 DIAGNOSIS — M130 Polyarthritis, unspecified: Secondary | ICD-10-CM | POA: Diagnosis present

## 2022-08-03 DIAGNOSIS — E8809 Other disorders of plasma-protein metabolism, not elsewhere classified: Secondary | ICD-10-CM | POA: Diagnosis present

## 2022-08-03 DIAGNOSIS — A498 Other bacterial infections of unspecified site: Secondary | ICD-10-CM

## 2022-08-03 DIAGNOSIS — N390 Urinary tract infection, site not specified: Principal | ICD-10-CM | POA: Diagnosis present

## 2022-08-03 DIAGNOSIS — Z1612 Extended spectrum beta lactamase (ESBL) resistance: Secondary | ICD-10-CM | POA: Diagnosis present

## 2022-08-03 DIAGNOSIS — N179 Acute kidney failure, unspecified: Secondary | ICD-10-CM

## 2022-08-03 DIAGNOSIS — E860 Dehydration: Secondary | ICD-10-CM | POA: Diagnosis present

## 2022-08-03 DIAGNOSIS — K529 Noninfective gastroenteritis and colitis, unspecified: Secondary | ICD-10-CM | POA: Diagnosis present

## 2022-08-03 DIAGNOSIS — M879 Osteonecrosis, unspecified: Secondary | ICD-10-CM | POA: Diagnosis present

## 2022-08-03 DIAGNOSIS — Z1152 Encounter for screening for COVID-19: Secondary | ICD-10-CM

## 2022-08-03 DIAGNOSIS — R739 Hyperglycemia, unspecified: Secondary | ICD-10-CM | POA: Diagnosis present

## 2022-08-03 DIAGNOSIS — M5441 Lumbago with sciatica, right side: Secondary | ICD-10-CM | POA: Diagnosis present

## 2022-08-03 DIAGNOSIS — R197 Diarrhea, unspecified: Secondary | ICD-10-CM

## 2022-08-03 DIAGNOSIS — M109 Gout, unspecified: Secondary | ICD-10-CM | POA: Diagnosis present

## 2022-08-03 DIAGNOSIS — Z87891 Personal history of nicotine dependence: Secondary | ICD-10-CM

## 2022-08-03 DIAGNOSIS — B9620 Unspecified Escherichia coli [E. coli] as the cause of diseases classified elsewhere: Secondary | ICD-10-CM | POA: Diagnosis present

## 2022-08-03 LAB — COMPREHENSIVE METABOLIC PANEL
ALT: 28 U/L (ref 0–44)
AST: 22 U/L (ref 15–41)
Albumin: 3.3 g/dL — ABNORMAL LOW (ref 3.5–5.0)
Alkaline Phosphatase: 76 U/L (ref 38–126)
Anion gap: 16 — ABNORMAL HIGH (ref 5–15)
BUN: 51 mg/dL — ABNORMAL HIGH (ref 6–20)
CO2: 20 mmol/L — ABNORMAL LOW (ref 22–32)
Calcium: 7.9 mg/dL — ABNORMAL LOW (ref 8.9–10.3)
Chloride: 96 mmol/L — ABNORMAL LOW (ref 98–111)
Creatinine, Ser: 5.13 mg/dL — ABNORMAL HIGH (ref 0.61–1.24)
GFR, Estimated: 14 mL/min — ABNORMAL LOW (ref >60)
Glucose, Bld: 177 mg/dL — ABNORMAL HIGH (ref 70–99)
Potassium: 2.3 mmol/L — CL (ref 3.5–5.1)
Sodium: 132 mmol/L — ABNORMAL LOW (ref 135–145)
Total Bilirubin: 0.8 mg/dL (ref 0.3–1.2)
Total Protein: 8.6 g/dL — ABNORMAL HIGH (ref 6.5–8.1)

## 2022-08-03 LAB — HIV ANTIBODY (ROUTINE TESTING W REFLEX): HIV Screen 4th Generation wRfx: NONREACTIVE

## 2022-08-03 LAB — GASTROINTESTINAL PANEL BY PCR, STOOL (REPLACES STOOL CULTURE)

## 2022-08-03 LAB — URIC ACID: Uric Acid, Serum: 15.7 mg/dL — ABNORMAL HIGH (ref 3.7–8.6)

## 2022-08-03 LAB — CBC WITH DIFFERENTIAL/PLATELET
Abs Immature Granulocytes: 0.16 10*3/uL — ABNORMAL HIGH (ref 0.00–0.07)
Basophils Absolute: 0.1 10*3/uL (ref 0.0–0.1)
Basophils Relative: 0 %
Eosinophils Absolute: 0 10*3/uL (ref 0.0–0.5)
Eosinophils Relative: 0 %
HCT: 38.4 % — ABNORMAL LOW (ref 39.0–52.0)
Hemoglobin: 13.5 g/dL (ref 13.0–17.0)
Immature Granulocytes: 1 %
Lymphocytes Relative: 5 %
Lymphs Abs: 1.1 10*3/uL (ref 0.7–4.0)
MCH: 30.2 pg (ref 26.0–34.0)
MCHC: 35.2 g/dL (ref 30.0–36.0)
MCV: 85.9 fL (ref 80.0–100.0)
Monocytes Absolute: 1.7 10*3/uL — ABNORMAL HIGH (ref 0.1–1.0)
Monocytes Relative: 8 %
Neutro Abs: 17.5 10*3/uL — ABNORMAL HIGH (ref 1.7–7.7)
Neutrophils Relative %: 86 %
Platelets: 224 10*3/uL (ref 150–400)
RBC: 4.47 MIL/uL (ref 4.22–5.81)
RDW: 13.1 % (ref 11.5–15.5)
WBC: 20.4 10*3/uL — ABNORMAL HIGH (ref 4.0–10.5)
nRBC: 0 % (ref 0.0–0.2)

## 2022-08-03 LAB — URINALYSIS, ROUTINE W REFLEX MICROSCOPIC
Bilirubin Urine: NEGATIVE
Glucose, UA: NEGATIVE mg/dL
Ketones, ur: NEGATIVE mg/dL
Nitrite: NEGATIVE
Protein, ur: >=300 mg/dL — AB
RBC / HPF: >50 RBC/hpf (ref 0–5)
Specific Gravity, Urine: 1.016 (ref 1.005–1.030)
WBC, UA: >50 WBC/hpf (ref 0–5)
pH: 5 (ref 5.0–8.0)

## 2022-08-03 LAB — LACTIC ACID, PLASMA
Lactic Acid, Venous: 1.6 mmol/L (ref 0.5–1.9)
Lactic Acid, Venous: 1.2 mmol/L (ref 0.5–1.9)

## 2022-08-03 LAB — RESP PANEL BY RT-PCR (RSV, FLU A&B, COVID)  RVPGX2
Influenza A by PCR: NEGATIVE
Influenza B by PCR: NEGATIVE
Resp Syncytial Virus by PCR: NEGATIVE
SARS Coronavirus 2 by RT PCR: NEGATIVE

## 2022-08-03 LAB — CREATININE, SERUM
Creatinine, Ser: 5.07 mg/dL — ABNORMAL HIGH (ref 0.61–1.24)
GFR, Estimated: 14 mL/min — ABNORMAL LOW (ref >60)

## 2022-08-03 LAB — HEPATITIS A ANTIBODY, TOTAL: hep A Total Ab: NONREACTIVE

## 2022-08-03 LAB — C DIFFICILE QUICK SCREEN W PCR REFLEX
C Diff antigen: NEGATIVE
C Diff interpretation: NOT DETECTED
C Diff toxin: NEGATIVE

## 2022-08-03 LAB — MAGNESIUM: Magnesium: 1.9 mg/dL (ref 1.7–2.4)

## 2022-08-03 LAB — TSH: TSH: 4.867 u[IU]/mL — ABNORMAL HIGH (ref 0.350–4.500)

## 2022-08-03 MED ORDER — ACETAMINOPHEN 325 MG PO TABS: 650.0000 mg | ORAL_TABLET | Freq: Four times a day (QID) | ORAL | Status: DC | PRN

## 2022-08-03 MED ORDER — POTASSIUM CHLORIDE CRYS ER 20 MEQ PO TBCR
40.0000 meq | EXTENDED_RELEASE_TABLET | ORAL | Status: AC
  Administered 2022-08-03 (×3): 40 meq via ORAL
  Filled 2022-08-03 (×3): qty 2

## 2022-08-03 MED ORDER — ADULT MULTIVITAMIN W/MINERALS CH
1.0000 | ORAL_TABLET | Freq: Every day | ORAL | Status: DC
  Administered 2022-08-03 – 2022-08-10 (×8): 1 via ORAL
  Filled 2022-08-03 (×8): qty 1

## 2022-08-03 MED ORDER — SODIUM CHLORIDE 0.9 % IV BOLUS
1000.0000 mL | Freq: Once | INTRAVENOUS | Status: AC
  Administered 2022-08-03: 1000 mL via INTRAVENOUS

## 2022-08-03 MED ORDER — ONDANSETRON HCL 4 MG/2ML IJ SOLN
4.0000 mg | Freq: Four times a day (QID) | INTRAMUSCULAR | Status: DC | PRN
  Administered 2022-08-03 – 2022-08-04 (×2): 4 mg via INTRAVENOUS
  Filled 2022-08-03 (×2): qty 2

## 2022-08-03 MED ORDER — LACTATED RINGERS IV BOLUS
1000.0000 mL | Freq: Once | INTRAVENOUS | Status: AC
  Administered 2022-08-03: 1000 mL via INTRAVENOUS

## 2022-08-03 MED ORDER — HYDROCODONE-ACETAMINOPHEN 5-325 MG PO TABS
1.0000 | ORAL_TABLET | ORAL | Status: DC | PRN
  Administered 2022-08-03 – 2022-08-09 (×9): 2 via ORAL
  Filled 2022-08-03 (×9): qty 2

## 2022-08-03 MED ORDER — POLYETHYLENE GLYCOL 3350 17 G PO PACK
17.0000 g | PACK | Freq: Every day | ORAL | Status: DC | PRN
  Filled 2022-08-03: qty 1

## 2022-08-03 MED ORDER — LACTATED RINGERS IV SOLN: INTRAVENOUS | Status: DC

## 2022-08-03 MED ORDER — HEPARIN SODIUM (PORCINE) 5000 UNIT/ML IJ SOLN
5000.0000 [IU] | Freq: Three times a day (TID) | INTRAMUSCULAR | Status: DC
  Administered 2022-08-03 – 2022-08-10 (×21): 5000 [IU] via SUBCUTANEOUS
  Filled 2022-08-03 (×20): qty 1

## 2022-08-03 MED ORDER — ONDANSETRON HCL 4 MG/2ML IJ SOLN
4.0000 mg | Freq: Once | INTRAMUSCULAR | Status: AC
  Administered 2022-08-03: 4 mg via INTRAVENOUS
  Filled 2022-08-03: qty 2

## 2022-08-03 MED ORDER — ACETAMINOPHEN 650 MG RE SUPP: 650.0000 mg | Freq: Four times a day (QID) | RECTAL | Status: DC | PRN

## 2022-08-03 MED ORDER — PANTOPRAZOLE SODIUM 40 MG PO TBEC
40.0000 mg | DELAYED_RELEASE_TABLET | Freq: Two times a day (BID) | ORAL | Status: DC
  Administered 2022-08-03 – 2022-08-10 (×15): 40 mg via ORAL
  Filled 2022-08-03 (×15): qty 1

## 2022-08-03 MED ORDER — POTASSIUM CHLORIDE 10 MEQ/100ML IV SOLN
10.0000 meq | Freq: Once | INTRAVENOUS | Status: AC
  Administered 2022-08-03: 10 meq via INTRAVENOUS
  Filled 2022-08-03: qty 100

## 2022-08-03 MED ORDER — ONDANSETRON HCL 4 MG PO TABS: 4.0000 mg | ORAL_TABLET | Freq: Four times a day (QID) | ORAL | Status: DC | PRN

## 2022-08-03 MED ORDER — MORPHINE SULFATE (PF) 4 MG/ML IV SOLN
4.0000 mg | INTRAVENOUS | Status: DC | PRN
  Administered 2022-08-03 – 2022-08-04 (×3): 4 mg via INTRAVENOUS
  Filled 2022-08-03 (×3): qty 1

## 2022-08-03 MED ORDER — SODIUM CHLORIDE 0.9 % IV SOLN
2.0000 g | INTRAVENOUS | Status: DC
  Administered 2022-08-03: 2 g via INTRAVENOUS
  Filled 2022-08-03 (×2): qty 20

## 2022-08-03 NOTE — ED Provider Notes (Signed)
[facility] Provider Note    Event Date/Time   First MD Initiated Contact with Patient 08/03/22 [phone]     (approximate)   History   Dysuria   HPI  Mason Russell is a 37 y.o. male presents to the ER for evaluation of lower abdominal pain and diarrhea with some shortness of breath symptoms started Monday.  Having worsening pain today particular right side.  Denies any recent antibiotics.  Does feel weak and dehydrated like he is about to pass out.     Physical Exam   Triage Vital Signs: ED Triage Vitals  Encounter Vitals Group     BP      Systolic BP Percentile      Diastolic BP Percentile      Pulse      Resp      Temp      Temp src      SpO2      Weight      Height      Head Circumference      Peak Flow      Pain Score      Pain Loc      Pain Education      Exclude from Growth Chart     Most recent vital signs: Vitals:   08/03/22 0948 08/03/22 1100  BP: 114/87   Pulse: 98 93  Resp: (!) 25 17  Temp: 98.2 F (36.8 C)   SpO2: 100% 99%     Constitutional: Alert  Eyes: Conjunctivae are normal.  Head: Atraumatic. Nose: No congestion/rhinnorhea. Mouth/Throat: Mucous membranes are moist.   Neck: Painless ROM.  Cardiovascular:   Good peripheral circulation. Respiratory: Normal respiratory effort.  No retractions.  Gastrointestinal: Soft and nontender.  Musculoskeletal:  no deformity Neurologic:  MAE spontaneously. No gross focal neurologic deficits are appreciated.  Skin:  Skin is warm, dry and intact. No rash noted. Psychiatric: Mood and affect are normal. Speech and behavior are normal.    ED Results / Procedures / Treatments   Labs (all labs ordered are listed, but only abnormal results are displayed) Labs Reviewed  CBC WITH DIFFERENTIAL/PLATELET - Abnormal; Notable for the following components:      Result Value   WBC 20.4 (*)    HCT 38.4 (*)    Neutro Abs 17.5 (*)    Monocytes Absolute 1.7 (*)    Abs Immature  Granulocytes 0.16 (*)    All other components within normal limits  COMPREHENSIVE METABOLIC PANEL - Abnormal; Notable for the following components:   Sodium 132 (*)    Potassium 2.3 (*)    Chloride 96 (*)    CO2 20 (*)    Glucose, Bld 177 (*)    BUN 51 (*)    Creatinine, Ser 5.13 (*)    Calcium 7.9 (*)    Total Protein 8.6 (*)    Albumin 3.3 (*)    GFR, Estimated 14 (*)    Anion gap 16 (*)    All other components within normal limits  URINALYSIS, ROUTINE W REFLEX MICROSCOPIC - Abnormal; Notable for the following components:   Color, Urine YELLOW (*)    APPearance TURBID (*)    Hgb urine dipstick MODERATE (*)    Protein, ur >=300 (*)    Leukocytes,Ua LARGE (*)    Bacteria, UA MANY (*)    All other components within normal limits  RESP PANEL BY RT-PCR (RSV, FLU A&B, COVID)  [id number]  CULTURE, BLOOD (ROUTINE X  2)  CULTURE, BLOOD (ROUTINE X 2)  LACTIC ACID, PLASMA  LACTIC ACID, PLASMA     EKG  ED ECG REPORT I, Willy Eddy, the attending physician, personally viewed and interpreted this ECG.   Date: 08/03/2022  EKG Time: 9:47  Rate: 95  Rhythm: sinus  Axis: normal  Intervals: normal  ST&T Change: no stemi, no depressions    RADIOLOGY    PROCEDURES:  Critical Care performed: No  Procedures   MEDICATIONS ORDERED IN ED: Medications  morphine (PF) 4 MG/ML injection 4 mg (4 mg Intravenous Given 08/03/22 1020)  cefTRIAXone (ROCEPHIN) 2 g in sodium chloride 0.9 % 100 mL IVPB (2 g Intravenous New Bag/Given 08/03/22 1136)  potassium chloride 10 mEq in 100 mL IVPB (10 mEq Intravenous New Bag/Given 08/03/22 1132)  sodium chloride 0.9 % bolus 1,000 mL (0 mLs Intravenous Stopped 08/03/22 1101)  ondansetron (ZOFRAN) injection 4 mg (4 mg Intravenous Given 08/03/22 1020)     IMPRESSION / MDM / ASSESSMENT AND PLAN / ED COURSE  I reviewed the triage vital signs and the nursing notes.                              Differential diagnosis includes, but is not limited to,  sepsis, UTI COVID status, pyelonephritis, stone, prostatitis, colitis  Patient presenting to the ER for evaluation of symptoms as described above.  Based on symptoms, risk factors and considered above differential, this presenting complaint could reflect a potentially life-threatening illness therefore the patient will be placed on continuous pulse oximetry and telemetry for monitoring.  Laboratory evaluation will be sent to evaluate for the above complaints.      Clinical Course as of 08/03/22 1138  Fri Aug 03, 2022  1032 Patient with significant AKI.  Receiving IV fluids for resuscitation. [PR]  1046 Patient with evidence of UTI will give IV fluids IV antibiotics will replete potassium. [PR]  1116 CT imaging on my review and interpretation with possible distal left ureteral stone versus phlebolith.  Will await formal radiology report. [PR]    Clinical Course User Index [PR] Willy Eddy, MD   Will consult hospitalist for admission for IV fluids and IV antibiotics.  FINAL CLINICAL IMPRESSION(S) / ED DIAGNOSES   Final diagnoses:  Pyelonephritis  AKI (acute kidney injury) (HCC)     Rx / DC Orders   ED Discharge Orders     None        Note:  This document was prepared using Dragon voice recognition software and may include unintentional dictation errors.    Willy Eddy, MD 08/03/22 [phone]

## 2022-08-03 NOTE — ED Notes (Signed)
Patient transported to CT 

## 2022-08-03 NOTE — Consult Note (Signed)
[facility] Consult Note: 08/03/22     Date of Admission:  08/03/2022           Reason for Consult:  AKI   Referring Provider: No att. providers found Primary Care Provider: System, Provider Not In   History of Presenting Illness:  Mason Russell is a 37 y.o. male with a history of gout, inflammatory arthritis, bilateral necrosis of femoral heads, was in usual state of health until about 3 months ago.  He states he returned from [place] about 3 months ago and since then he has been sick.  He contracted some GI illness over there and was not able to eat.  He had vomiting and was taking local the prescribed medications.  Over time he has gotten worse and has been taking Tylenol and Advil.  Taking 2 Advil's 3 times a day.  He reports diarrhea and vomiting over the past several days. CT scan of the abdomen without contrast shows inflammatory changes in the kidneys bilaterally.  No stone. Urinalysis shows moderate hemoglobin, proteinuria greater than 300, many bacteria, greater than 50 RBCs, greater than 50 WBCs, 11-20 squamous epithelial cells. Autoimmune workup in June 2023 including double-stranded DNA, anti-Jo1 antibodies, ENA's, HLA-B27, Brucella antibodies, Lyme antibodies, anti-CCP antibodies, anticardiolipin antibodies was negative.  ESR was elevated last year in June 2023 up to 83. He had CT-guided aspiration of the right sacroiliac joint in June 2023.  Review of Systems: Review of Systems  Constitutional:  Positive for fever and malaise/fatigue.  HENT: Negative.    Eyes: Negative.   Respiratory:  Positive for cough.   Cardiovascular: Negative.   Gastrointestinal:  Positive for abdominal pain, nausea and vomiting.  Genitourinary:  Negative for dysuria, hematuria and urgency.  Musculoskeletal:  Positive for back pain and joint pain.  Skin: Negative.   Neurological:  Positive for weakness.  Endo/Heme/Allergies: Negative.   Psychiatric/Behavioral:  Negative.      Past Medical History:  Diagnosis Date   Gout    Obesity with body mass index (BMI) of 30.0 to 39.9    Scoliosis     Social History   Tobacco Use   Smoking status: Former    Types: Cigarettes   Smokeless tobacco: Never  Vaping Use   Vaping status: Never Used  Substance Use Topics   Alcohol use: Yes    Comment: occasionally   Drug use: Yes    Types: Marijuana    Family History  Problem Relation Age of Onset   Cancer Mother      OBJECTIVE: Blood pressure (!) 132/91, pulse 89, temperature 99.4 F (37.4 C), temperature source Oral, resp. rate 17, height 5\' 5"  (1.651 m), weight 77.1 kg, SpO2 99%.  Physical Exam General appearance: No acute distress, laying in the bed HEENT: Dry oral mucous membranes Pulmonary: Normal respiratory effort on room air Cardiac: No rub Abdomen: Distended, mildly tender Extremities: No peripheral edema Musculoskeletal: No acute joint swelling or effusions Skin: No acute rashes Neuro: Alert and oriented.  Able to provide information and answer questions.  Lab Results Lab Results  Component Value Date   WBC 20.4 (H) 08/03/2022   HGB 13.5 08/03/2022   HCT 38.4 (L) 08/03/2022   MCV 85.9 08/03/2022   PLT 224 08/03/2022    Lab Results  Component Value Date   CREATININE 5.13 (H) 08/03/2022   BUN 51 (H) 08/03/2022   NA 132 (L) 08/03/2022   K 2.3 (LL) 08/03/2022   CL 96 (L) 08/03/2022   CO2  20 (L) 08/03/2022    Lab Results  Component Value Date   ALT 28 08/03/2022   AST 22 08/03/2022   ALKPHOS 76 08/03/2022   BILITOT 0.8 08/03/2022     Microbiology: Recent Results (from the past 240 hour(s))  Resp panel by RT-PCR (RSV, Flu A&B, Covid) Anterior Nasal Swab     Status: None   Collection Time: 08/03/22 10:17 AM   Specimen: Anterior Nasal Swab  Result Value Ref Range Status   SARS Coronavirus 2 by RT PCR NEGATIVE NEGATIVE Final    Comment: (NOTE) SARS-CoV-2 target nucleic acids are NOT DETECTED.  The SARS-CoV-2  RNA is generally detectable in upper respiratory specimens during the acute phase of infection. The lowest concentration of SARS-CoV-2 viral copies this assay can detect is 138 copies/mL. A negative result does not preclude SARS-Cov-2 infection and should not be used as the sole basis for treatment or other patient management decisions. A negative result may occur with  improper specimen collection/handling, submission of specimen other than nasopharyngeal swab, presence of viral mutation(s) within the areas targeted by this assay, and inadequate number of viral copies(<138 copies/mL). A negative result must be combined with clinical observations, patient history, and epidemiological information. The expected result is Negative.  Fact Sheet for Patients:  BloggerCourse.com  Fact Sheet for Healthcare Providers:  SeriousBroker.it  This test is no t yet approved or cleared by the [place] FDA and  has been authorized for detection and/or diagnosis of SARS-CoV-2 by FDA under an Emergency Use Authorization (EUA). This EUA will remain  in effect (meaning this test can be used) for the duration of the COVID-19 declaration under Section 564(b)(1) of the Act, 21 U.S.C.section 360bbb-3(b)(1), unless the authorization is terminated  or revoked sooner.       Influenza A by PCR NEGATIVE NEGATIVE Final   Influenza B by PCR NEGATIVE NEGATIVE Final    Comment: (NOTE) The Xpert Xpress SARS-CoV-2/FLU/RSV plus assay is intended as an aid in the diagnosis of influenza from Nasopharyngeal swab specimens and should not be used as a sole basis for treatment. Nasal washings and aspirates are unacceptable for Xpert Xpress SARS-CoV-2/FLU/RSV testing.  Fact Sheet for Patients: BloggerCourse.com  Fact Sheet for Healthcare Providers: SeriousBroker.it  This test is not yet approved or cleared by the  [place] FDA and has been authorized for detection and/or diagnosis of SARS-CoV-2 by FDA under an Emergency Use Authorization (EUA). This EUA will remain in effect (meaning this test can be used) for the duration of the COVID-19 declaration under Section 564(b)(1) of the Act, 21 U.S.C. section 360bbb-3(b)(1), unless the authorization is terminated or revoked.     Resp Syncytial Virus by PCR NEGATIVE NEGATIVE Final    Comment: (NOTE) Fact Sheet for Patients: BloggerCourse.com  Fact Sheet for Healthcare Providers: SeriousBroker.it  This test is not yet approved or cleared by the [place] FDA and has been authorized for detection and/or diagnosis of SARS-CoV-2 by FDA under an Emergency Use Authorization (EUA). This EUA will remain in effect (meaning this test can be used) for the duration of the COVID-19 declaration under Section 564(b)(1) of the Act, 21 U.S.C. section 360bbb-3(b)(1), unless the authorization is terminated or revoked.  Performed at [facility], [address] [place], [place] [address]     Medications: Scheduled Meds:  heparin  5,000 Units Subcutaneous Q8H   multivitamin with minerals  1 tablet Oral Daily   potassium chloride  40 mEq Oral Q2H   Continuous Infusions:  cefTRIAXone (ROCEPHIN)  IV Stopped (08/03/22 1209)   lactated ringers     PRN Meds:.acetaminophen **OR** acetaminophen, HYDROcodone-acetaminophen, morphine injection, ondansetron **OR** ondansetron (ZOFRAN) IV, polyethylene glycol  No Known Allergies  Urinalysis: Recent Labs    08/03/22 1010  COLORURINE YELLOW*  LABSPEC 1.016  PHURINE 5.0  GLUCOSEU NEGATIVE  HGBUR MODERATE*  BILIRUBINUR NEGATIVE  KETONESUR NEGATIVE  PROTEINUR >=300*  NITRITE NEGATIVE  LEUKOCYTESUR LARGE*      Imaging: CT ABDOMEN PELVIS WO CONTRAST  Result Date: 08/03/2022 CLINICAL DATA:  Right lower quadrant abdominal pain EXAM: CT ABDOMEN  AND PELVIS WITHOUT CONTRAST TECHNIQUE: Multidetector CT imaging of the abdomen and pelvis was performed following the standard protocol without IV contrast. RADIATION DOSE REDUCTION: This exam was performed according to the departmental dose-optimization program which includes automated exposure control, adjustment of the mA and/or kV according to patient size and/or use of iterative reconstruction technique. COMPARISON:  None Available. FINDINGS: Lower chest: There is some linear opacity seen along bases likely scar or atelectasis. No pleural effusion. Hepatobiliary: Diffuse fatty liver infiltration identified. There is some areas of sparing along the margin of the gallbladder. Gallbladder itself is nondilated. Pancreas: Unremarkable. No pancreatic ductal dilatation or surrounding inflammatory changes. Spleen: Normal in size without focal abnormality. Adrenals/Urinary Tract: The adrenal glands are preserved. Both kidneys appears slightly enlarged and there is some perinephric stranding. No abnormal calcification seen within either kidney nor along the course of either ureter. The bladder is underdistended. Stomach/Bowel: Large bowel on this non oral contrast examination has a normal course and caliber. Mild stool. Normal appendix extends inferior to the cecum in the right lower quadrant. The stomach is underdistended. Small bowel is nondilated. Vascular/Lymphatic: No significant vascular findings are present. No enlarged abdominal or pelvic lymph nodes. Reproductive: Prostate is unremarkable. Other: No abdominal wall hernia or abnormality. No abdominopelvic ascites. Musculoskeletal: No acute or significant osseous findings. IMPRESSION: No obstructing renal stone. Of note the kidneys appear somewhat enlarged and there is some perinephric stranding. This has a differential including an infectious or inflammatory process. If there is concern of pyelonephritis or other process process and IV contrast study may be  useful to further delineate. Fatty liver infiltration. Normal appendix. Electronically Signed   By: Karen Kays M.D.   On: 08/03/2022 11:33   DG Chest 2 View  Result Date: 08/03/2022 CLINICAL DATA:  sob, eval for pna EXAM: CHEST - 2 VIEW COMPARISON:  CXR 06/24/21 FINDINGS: No pleural effusion. No pneumothorax. Low lung volumes. Likely normal cardiac and mediastinal contours when accounting for low lung volumes. No focal airspace opacity. No radiographically apparent displaced rib fractures. Visualized upper abdomen is unremarkable. Vertebral body heights are maintained IMPRESSION: No focal airspace opacity. Electronically Signed   By: Lorenza Cambridge M.D.   On: 08/03/2022 10:30      Assessment/Plan:  Mason Russell is a 37 y.o. male with medical problems of gout, inflammatory arthritis, history of bilateral necrosis of femoral heads-MRI September 2023 was admitted on 08/03/2022 for :  Pyelonephritis [N12] AKI (acute kidney injury) (HCC) [N17.9]  1.  Acute kidney injury-normal creatinine in September 28, 2021 of 0.73.  Differential includes glomerulonephritis versus nonsteroidal induced acute kidney injury. 2.  Hematuria 3.  Proteinuria 4.  Acute Gastroenteritis-nausea, vomiting, diarrhea 5.  Hypokalemia 6.  Hypoalbuminemia 7.  Hyperglycemia   Plan: Will obtain urine protein to creatinine ratio Previous autoimmune workup in 2023 is negative as noted above.  Agree with ANA screen, complements and ANCA screen.  We will also  obtain HIV antibodies, hepatitis A, B, C assessment Symptomatic treatment and replace electrolytes as needed. Agree with IV hydration with lactated Ringer. Strictly avoid nonsteroidals. Add PPI Add uric acid Further recommendations as per hospital course.  No acute indication for HD at present. Will assess response to iv hydration.    Lessly Stigler Thedore Mins 08/03/22

## 2022-08-03 NOTE — Assessment & Plan Note (Addendum)
Potassium at 2.3 on admission.  Likely secondary to GI losses with diarrhea. -Check magnesium -Replete potassium and monitor

## 2022-08-03 NOTE — Assessment & Plan Note (Signed)
Patient was diagnosed with inflammatory arthritis in June 2023 and was evaluated by ID at that time.  He was recommended to follow-up with rheumatology for further workup, apparently did not follow-up with rheumatology. Prior workup done last year was negative -Pending ANA, ANCA, hep C, C3 and C4 labs -Need rheumatologic workup especially now he has significant protein urea

## 2022-08-03 NOTE — Assessment & Plan Note (Addendum)
Presented with dysuria and lower abdominal pain, CT abdomen with concern of pyelonephritis.  UA positive for >300 protein, large leukocytes, many bacteria and microscopic hematuria.  Blood and urine cultures pending.  No fever, leukocytosis.  Did not meet criteria for sepsis. -Admit to MedSurg -Start him on ceftriaxone -Follow-up cultures

## 2022-08-03 NOTE — Assessment & Plan Note (Signed)
Patient was found to have bilateral femoral head necrosis on MRI done during prior admission in 2023. Also has significant bilateral knee pain. -Outpatient follow-up with orthopedic surgery

## 2022-08-03 NOTE — Assessment & Plan Note (Signed)
Patient with significant protein urea and AKI which is new from his baseline.  Worsening renal function despite getting IV fluid.  Concern of NSAID induced renal injury per nephrology. -Continue with gentle IV hydration -Monitor renal function -Strictly avoid all NSAID -Avoid nephrotoxins

## 2022-08-03 NOTE — H&P (Signed)
History and Physical    Patient: Mason Russell AVW:[id number] DOB: 29-Sep-1985 DOA: 08/03/2022 DOS: the patient was seen and examined on 08/03/2022 PCP: System, Provider Not In  Patient coming from: Home  Chief Complaint:  Chief Complaint  Patient presents with   Dysuria   HPI: Mason Russell is a 37 y.o. male with medical history significant of inflammatory arthritis, bilateral necrosis of femoral heads presented to ED with complaints of lower abdominal pain and dysuria for the past few days.  Per patient he started generalized malaise and headache on Monday, subjective fevers but he never checked his temperature, poor appetite and intermittent nausea.  On Wednesday he developed watery diarrhea with multiple episodes daily.  During diarrhea he was having decreased urinary output, yesterday he developed worsening lower abdominal and lower back pain with some associated dysuria. Patient denies any gross hematuria.  No melena or hematochezia.  Appetite remained poor.  Intermittent nausea and one vomitus yesterday.  Continue to have headache and generalized malaise.  No sick contacts.  His most recent travel was 3 months ago when he traveled to [place], at that time he developed diarrhea which lasted couple of weeks and then resolved.  Patient denies any chest pain, having some exertional dyspnea with this recurrent diarrhea and feeling weak.  No orthopnea or PND.  No other upper respiratory symptoms.  Patient lives with his parents and currently unemployed. Denies smoking, occasionally drinks alcohol and occasionally uses marijuana.  ED course and data reviewed.  Vital stable, labs pertinent for neutrophilic predominant leukocytosis at 20.4, sodium 132, potassium 2.3, chloride 96, CO2 20, blood glucose 177, BUN 51, creatinine 5.13, total protein 8.6 and albumin 3.3.  UA with significant protein urea, microscopic hematuria, pyuria and many bacteria. Chest x-ray was negative for any acute  abnormality. CT abdomen and pelvis bilaterally enlarged kidneys with some perinephric stranding, concerning for infectious or inflammatory process. EKG, personally reviewed, normal sinus rhythm.  TRH was consulted for admission due to pyelonephritis. Nephrology was consulted for significant proteinuria and AKI. Blood and urine cultures were obtained and patient was started on ceftriaxone.   Review of Systems: As mentioned in the history of present illness. All other systems reviewed and are negative. Past Medical History:  Diagnosis Date   Gout    Obesity with body mass index (BMI) of 30.0 to 39.9    Scoliosis    Past Surgical History:  Procedure Laterality Date   NO PAST SURGERIES     Social History:  reports that he has quit smoking. His smoking use included cigarettes. He has never used smokeless tobacco. He reports current alcohol use. He reports current drug use. Drug: Marijuana.  No Known Allergies  Family History  Problem Relation Age of Onset   Cancer Mother     Prior to Admission medications   Medication Sig Start Date End Date Taking? Authorizing Provider  ibuprofen (ADVIL) 800 MG tablet Take 1 tablet (800 mg total) by mouth every 8 (eight) hours as needed. Patient not taking: Reported on 08/03/2022 09/29/21   Marrion Coy, MD  lidocaine (LIDODERM) 5 % Place 1 patch onto the skin daily. Remove & Discard patch within 12 hours or as directed by MD Patient not taking: Reported on 08/03/2022 09/29/21   Marrion Coy, MD  oxyCODONE-acetaminophen (PERCOCET) 5-325 MG tablet Take 1 tablet by mouth every 8 (eight) hours as needed for severe pain. Patient not taking: Reported on 08/03/2022 09/29/21   Marrion Coy, MD  pantoprazole (PROTONIX) 40 MG tablet Take  1 tablet (40 mg total) by mouth daily. 09/29/21 12/28/21  Marrion Coy, MD    Physical Exam: Vitals:   08/03/22 0949 08/03/22 1100 08/03/22 1200 08/03/22 1230  BP:   119/89 117/85  Pulse:  93 95 90  Resp:  17 (!) 27 (!) 31   Temp:      TempSrc:      SpO2:  99% 95% 96%  Weight: 77.1 kg     Height: 5\' 5"  (1.651 m)       General: Vital signs reviewed.  Patient is well-developed and appear malnourished, in no acute distress and cooperative with exam.  Head: Normocephalic and atraumatic. Eyes: EOMI, conjunctivae normal, no scleral icterus.  Neck: Supple, trachea midline, normal ROM, no JVD,  Cardiovascular: RRR, S1 normal, S2 normal, no murmurs, gallops, or rubs. Pulmonary/Chest: Clear to auscultation bilaterally, no wheezes, rales, or rhonchi. Abdominal: Soft, non-tender, non-distended, BS +, Extremities: No lower extremity edema bilaterally,  pulses symmetric and intact bilaterally.  Neurological: A&O x3, Strength is normal and symmetric bilaterally, cranial nerve II-XII are grossly intact, no focal motor deficit, sensory intact to light touch bilaterally.  Skin: Warm, dry and intact. No rashes or erythema. Psychiatric: Normal mood and affect.   Data Reviewed: Prior data reviewed  Assessment and Plan: * Pyelonephritis Presented with dysuria and lower abdominal pain, CT abdomen with concern of pyelonephritis.  UA positive for >300 protein, large leukocytes, many bacteria and microscopic hematuria.  Blood and urine cultures pending.  No fever, leukocytosis.  Did not meet criteria for sepsis. -Admit to MedSurg -Start him on ceftriaxone -Follow-up cultures  AKI (acute kidney injury) (HCC) Patient with significant protein urea and AKI which is new from his baseline.  Getting some IV fluid. -Continue with hydration -Nephrology consult for significant protein urea -Avoid nephrotoxins  Hypokalemia Potassium at 2.3 on admission.  Likely secondary to GI losses with diarrhea. -Check magnesium -Replete potassium and monitor  Diarrhea Watery diarrhea for the past 2 days.  No recent travel. -Check for C. difficile and GI pathogen panel -Supportive care and hydration  History of inflammatory  arthritis Patient was diagnosed with inflammatory arthritis in June 2023 and was evaluated by ID at that time.  He was recommended to follow-up with rheumatology for further workup, apparently did not follow-up with rheumatology. -Need rheumatologic workup especially now he has significant protein urea  Bilateral femoral head necrosis (HCC) Patient was found to have bilateral femoral head necrosis on MRI done during prior admission in 2023. Also has significant bilateral knee pain. -Outpatient follow-up with orthopedic surgery    Advance Care Planning:   Code Status: Full Code   Consults: Nephrology  Family Communication: Discussed with patient  Severity of Illness: The appropriate patient status for this patient is INPATIENT. Inpatient status is judged to be reasonable and necessary in order to provide the required intensity of service to ensure the patient's safety. The patient's presenting symptoms, physical exam findings, and initial radiographic and laboratory data in the context of their chronic comorbidities is felt to place them at high risk for further clinical deterioration. Furthermore, it is not anticipated that the patient will be medically stable for discharge from the hospital within 2 midnights of admission.   * I certify that at the point of admission it is my clinical judgment that the patient will require inpatient hospital care spanning beyond 2 midnights from the point of admission due to high intensity of service, high risk for further deterioration and high frequency of surveillance  required.*  This record has been created using Conservation officer, historic buildings. Errors have been sought and corrected,but may not always be located. Such creation errors do not reflect on the standard of care.   Author: Arnetha Courser, MD 08/03/2022 1:31 PM  For on call review [email or web address].

## 2022-08-03 NOTE — Assessment & Plan Note (Addendum)
Watery diarrhea for the past 2 days.  No recent travel. -Check for C. difficile and GI pathogen panel -Supportive care and hydration

## 2022-08-03 NOTE — ED Notes (Signed)
Roxan Hockey, MD, made aware of potassium 2.3

## 2022-08-03 NOTE — ED Triage Notes (Signed)
Pt to ED via EMS for dysuria, hematuria, weakness, and fatigue since Monday. Per EMS pt has had these symptoms since Monday. Pt reports non productive cough since Monday.  EMS vitals  BP113/80   HR 105  RR 30  CO2 28  Temp 97.8  CBG 180

## 2022-08-04 DIAGNOSIS — M879 Osteonecrosis, unspecified: Secondary | ICD-10-CM

## 2022-08-04 LAB — RENAL FUNCTION PANEL
Albumin: 2.5 g/dL — ABNORMAL LOW (ref 3.5–5.0)
Anion gap: 8 (ref 5–15)
BUN: 55 mg/dL — ABNORMAL HIGH (ref 6–20)
CO2: 19 mmol/L — ABNORMAL LOW (ref 22–32)
Calcium: 7.5 mg/dL — ABNORMAL LOW (ref 8.9–10.3)
Chloride: 106 mmol/L (ref 98–111)
Creatinine, Ser: 6.28 mg/dL — ABNORMAL HIGH (ref 0.61–1.24)
GFR, Estimated: 11 mL/min — ABNORMAL LOW (ref >60)
Glucose, Bld: 120 mg/dL — ABNORMAL HIGH (ref 70–99)
Phosphorus: 2.2 mg/dL — ABNORMAL LOW (ref 2.5–4.6)
Potassium: 3.9 mmol/L (ref 3.5–5.1)
Sodium: 133 mmol/L — ABNORMAL LOW (ref 135–145)

## 2022-08-04 LAB — GLUCOSE, CAPILLARY
Glucose-Capillary: 159 mg/dL — ABNORMAL HIGH (ref 70–99)
Glucose-Capillary: 141 mg/dL — ABNORMAL HIGH (ref 70–99)
Glucose-Capillary: 119 mg/dL — ABNORMAL HIGH (ref 70–99)

## 2022-08-04 MED ORDER — SODIUM CHLORIDE 0.9 % IV SOLN
500.0000 mg | Freq: Two times a day (BID) | INTRAVENOUS | Status: DC
  Administered 2022-08-04 – 2022-08-06 (×6): 500 mg via INTRAVENOUS
  Filled 2022-08-04 (×4): qty 10
  Filled 2022-08-04: qty 500
  Filled 2022-08-04 (×2): qty 10

## 2022-08-04 MED ORDER — SODIUM BICARBONATE 650 MG PO TABS
650.0000 mg | ORAL_TABLET | Freq: Two times a day (BID) | ORAL | Status: DC
  Administered 2022-08-04 – 2022-08-05 (×3): 650 mg via ORAL
  Filled 2022-08-04 (×3): qty 1

## 2022-08-04 NOTE — Plan of Care (Signed)

## 2022-08-04 NOTE — Hospital Course (Addendum)
Mason Russell is a 37 y.o. male with medical history significant of inflammatory arthritis, bilateral necrosis of femoral heads presented to ED with complaints of lower abdominal pain and dysuria for the past few days.   Patient was also experiencing watery diarrhea for the past 2 days.  Poor p.o. intake and appetite.  ED course and data reviewed.  Vital stable, labs pertinent for neutrophilic predominant leukocytosis at 20.4, sodium 132, potassium 2.3, chloride 96, CO2 20, blood glucose 177, BUN 51, creatinine 5.13, total protein 8.6 and albumin 3.3.  UA with significant protein urea, microscopic hematuria, pyuria and many bacteria. Chest x-ray was negative for any acute abnormality. CT abdomen and pelvis bilaterally enlarged kidneys with some perinephric stranding, concerning for infectious or inflammatory process. EKG, personally reviewed, normal sinus rhythm.   TRH was consulted for admission due to pyelonephritis. Nephrology was consulted for significant proteinuria and AKI. Blood and urine cultures were obtained and patient was started on ceftriaxone.  7/27: Vitals with temperature of 99.9, HR 102, preliminary blood cultures positive for E. coli with CTX-M gene, suspected ESBL, antibiotics switched with meropenem.  Labs with improving leukocytosis at 16.4, hypokalemia improved with potassium at 3.5 CO2 19, worsening renal function with creatinine at 5.75.  C. difficile and GI pathogen panel negative.Uric acid significantly elevated at 15.7, TSH mildly elevated at 4.867, HIV negative.  Rest of autoimmune labs pending  Autoimmune workup in June 2023 including double-stranded DNA, anti-Jo1 antibodies, ENA's, HLA-B27, Brucella antibodies, Lyme antibodies, anti-CCP antibodies, anticardiolipin antibodies was negative.  ESR was elevated last year in June 2023 up to 83. He had CT-guided aspiration of the right sacroiliac joint in June 2023.  Nephrology is suspecting renal damage secondary to NSAID  use.  Oliguric, bladder scan with no residual.  7/28: Afebrile this morning, had an episode of fever at 100.4 yesterday around 8 PM.  Elevated protein creatinine ratio at 1.85.  Urine and blood culture both growing ESBL E. coli-sensitive to carbapenems, Bactrim and pip-tazo.  Worsening renal function, borderline UOP of about  1300 recorded.  Worsening abdominal discomfort and some distention, stopping IV fluid, ruling out ascites, may be getting fluid overload.  Breathing comfortable and remained on room air. Might need to start on dialysis, if renal function shows no sign of improvement in the next few days.  7/29: Hemodynamically stable.  Remained afebrile over the past 24 hours.  Renal function seems stable or at least plateaued.  Remains oliguric.  Elevated BNP at 855 but patient also has renal failure.  Echocardiogram was normal.  7/30: Vital stable.  Good urinary output yesterday, 3400 recorded with improvement in urinary function.  Complaining of pretty significant mid to lower back pain with some tenderness, ordered thoracic and lumbar spine MRI to rule out any osteo or discitis.  Also involving ID to help with type and duration of antibiotic.  7/31:Vital stable. MR negative for osteo or discitis,improving renal function.  8/1: Renal functions continue to improve.  Mild hypernatremia with sodium at 146 today.  Giving some hypotonic saline as trying to avoid D5 due to borderline hyperglycemia. Arrangements are made to complete a 10 day course of carbapenem at the surgery center as patient is uninsured.  Appointments were made starting from Saturday morning.  Per ID no midline needed and patient can use his peripheral line.  He can be discharged tomorrow after getting a dose of ertapenem.

## 2022-08-04 NOTE — Progress Notes (Signed)
Progress Note   Patient: Mason Russell ZOX:[id number] DOB: 05-13-1985 DOA: 08/03/2022     1 DOS: the patient was seen and examined on 08/04/2022   Brief hospital course:  Mason Russell is a 37 y.o. male with medical history significant of inflammatory arthritis, bilateral necrosis of femoral heads presented to ED with complaints of lower abdominal pain and dysuria for the past few days.   Patient was also experiencing watery diarrhea for the past 2 days.  Poor p.o. intake and appetite.  ED course and data reviewed.  Vital stable, labs pertinent for neutrophilic predominant leukocytosis at 20.4, sodium 132, potassium 2.3, chloride 96, CO2 20, blood glucose 177, BUN 51, creatinine 5.13, total protein 8.6 and albumin 3.3.  UA with significant protein urea, microscopic hematuria, pyuria and many bacteria. Chest x-ray was negative for any acute abnormality. CT abdomen and pelvis bilaterally enlarged kidneys with some perinephric stranding, concerning for infectious or inflammatory process. EKG, personally reviewed, normal sinus rhythm.   TRH was consulted for admission due to pyelonephritis. Nephrology was consulted for significant proteinuria and AKI. Blood and urine cultures were obtained and patient was started on ceftriaxone.  7/27: Vitals with temperature of 99.9, HR 102, preliminary blood cultures positive for E. coli with CTX-M gene, suspected ESBL, antibiotics switched with meropenem.  Labs with improving leukocytosis at 16.4, hypokalemia improved with potassium at 3.5 CO2 19, worsening renal function with creatinine at 5.75.  C. difficile and GI pathogen panel negative.Uric acid significantly elevated at 15.7, TSH mildly elevated at 4.867, HIV negative.  Rest of autoimmune labs pending  Autoimmune workup in June 2023 including double-stranded DNA, anti-Jo1 antibodies, ENA's, HLA-B27, Brucella antibodies, Lyme antibodies, anti-CCP antibodies, anticardiolipin antibodies was negative.  ESR  was elevated last year in June 2023 up to 83. He had CT-guided aspiration of the right sacroiliac joint in June 2023.  Nephrology is suspecting renal damage secondary to NSAID use.  Oliguric, bladder scan with no residual.   Assessment and Plan: * Pyelonephritis E. coli bacteremia. Presented with dysuria and lower abdominal pain, CT abdomen with concern of pyelonephritis.  UA positive for >300 protein, large leukocytes, many bacteria and microscopic hematuria.  Preliminary blood culture with E. coli, concern of ESBL, urine cultures pending.  No fever, leukocytosis.  Did not meet criteria for sepsis. -Rocephin switched with meropenem -Follow-up cultures  AKI (acute kidney injury) (HCC) Patient with significant protein urea and AKI which is new from his baseline.  Worsening renal function despite getting IV fluid.  Concern of NSAID induced renal injury per nephrology. -Continue with gentle IV hydration -Monitor renal function -Strictly avoid all NSAID -Avoid nephrotoxins  Hypokalemia Potassium at 2.3 on admission which has been improved to 3.5.  Likely secondary to GI losses with diarrhea. -Continue to monitor and replete as needed  Diarrhea Resolved Watery diarrhea for the past 2 days.  No recent travel. -C. difficile colitis PCR and GI pathogen panel negative -Supportive care and hydration  History of inflammatory arthritis Patient was diagnosed with inflammatory arthritis in June 2023 and was evaluated by ID at that time.  He was recommended to follow-up with rheumatology for further workup, apparently did not follow-up with rheumatology. Prior workup done last year was negative -Pending ANA, ANCA, hep C, C3 and C4 labs -Need rheumatologic workup especially now he has significant protein urea  Bilateral femoral head necrosis (HCC) Patient was found to have bilateral femoral head necrosis on MRI done during prior admission in 2023. Also has significant bilateral knee  pain. -Outpatient follow-up with orthopedic surgery   Subjective: Patient was complaining of some abdominal distention.  Passing flatus and had last bowel movement yesterday.  Unable to void much urine.  Physical Exam: Vitals:   08/03/22 1403 08/03/22 1642 08/04/22 0314 08/04/22 0738  BP: (!) 132/91 120/81 (!) 119/92 (!) 112/94  Pulse: 89 97 93 (!) 102  Resp: 17 20 18    Temp: 99.4 F (37.4 C)  98.6 F (37 C) 99.9 F (37.7 C)  TempSrc: Oral  Oral   SpO2: 99% 93% 94% 95%  Weight:      Height:       General.  Malnourished gentleman, in no acute distress. Pulmonary.  Lungs clear bilaterally, normal respiratory effort. CV.  Regular rate and rhythm, no JVD, rub or murmur. Abdomen.  Soft, nontender, nondistended, BS positive. CNS.  Alert and oriented .  No focal neurologic deficit. Extremities.  No edema, no cyanosis, pulses intact and symmetrical. Psychiatry.  Judgment and insight appears normal.   Data Reviewed: Prior data reviewed  Family Communication: Discussed with parents at bedside  Disposition: Status is: Inpatient Remains inpatient appropriate because: Severity of illness  Planned Discharge Destination: Home  DVT prophylaxis.  Subcu heparin Time spent: 50 minutes  This record has been created using Conservation officer, historic buildings. Errors have been sought and corrected,but may not always be located. Such creation errors do not reflect on the standard of care.   Author: Arnetha Courser, MD 08/04/2022 3:09 PM  For on call review [email or web address].

## 2022-08-04 NOTE — Progress Notes (Signed)
PHARMACY - PHYSICIAN COMMUNICATION CRITICAL VALUE ALERT - BLOOD CULTURE IDENTIFICATION (BCID)  Mason Russell is an 37 y.o. male who presented to [facility] on 08/03/2022 with a chief complaint of pyelonephritis  Assessment:  1/4 bottles GNR. BCID detected E coli with CTX-M gene. Suspect urinary source.  Name of physician (or Provider) Contacted: Dr. Nelson Chimes  Current antibiotics: Ceftriaxone  Changes to prescribed antibiotics recommended:  Recommend changing antibiotics to meropenem to cover for suspected ESBL organism  Results for orders placed or performed during the hospital encounter of 08/03/22  Blood Culture ID Panel (Reflexed) (Collected: 08/03/2022 10:52 AM)  Result Value Ref Range   Enterococcus faecalis NOT DETECTED NOT DETECTED   Enterococcus Faecium NOT DETECTED NOT DETECTED   Listeria monocytogenes NOT DETECTED NOT DETECTED   Staphylococcus species NOT DETECTED NOT DETECTED   Staphylococcus aureus (BCID) NOT DETECTED NOT DETECTED   Staphylococcus epidermidis NOT DETECTED NOT DETECTED   Staphylococcus lugdunensis NOT DETECTED NOT DETECTED   Streptococcus species NOT DETECTED NOT DETECTED   Streptococcus agalactiae NOT DETECTED NOT DETECTED   Streptococcus pneumoniae NOT DETECTED NOT DETECTED   Streptococcus pyogenes NOT DETECTED NOT DETECTED   A.calcoaceticus-baumannii NOT DETECTED NOT DETECTED   Bacteroides fragilis NOT DETECTED NOT DETECTED   Enterobacterales DETECTED (A) NOT DETECTED   Enterobacter cloacae complex NOT DETECTED NOT DETECTED   Escherichia coli DETECTED (A) NOT DETECTED   Klebsiella aerogenes NOT DETECTED NOT DETECTED   Klebsiella oxytoca NOT DETECTED NOT DETECTED   Klebsiella pneumoniae NOT DETECTED NOT DETECTED   Proteus species NOT DETECTED NOT DETECTED   Salmonella species NOT DETECTED NOT DETECTED   Serratia marcescens NOT DETECTED NOT DETECTED   Haemophilus influenzae NOT DETECTED NOT DETECTED   Neisseria meningitidis NOT DETECTED NOT DETECTED    Pseudomonas aeruginosa NOT DETECTED NOT DETECTED   Stenotrophomonas maltophilia NOT DETECTED NOT DETECTED   Candida albicans NOT DETECTED NOT DETECTED   Candida auris NOT DETECTED NOT DETECTED   Candida glabrata NOT DETECTED NOT DETECTED   Candida krusei NOT DETECTED NOT DETECTED   Candida parapsilosis NOT DETECTED NOT DETECTED   Candida tropicalis NOT DETECTED NOT DETECTED   Cryptococcus neoformans/gattii NOT DETECTED NOT DETECTED   CTX-M ESBL DETECTED (A) NOT DETECTED   Carbapenem resistance IMP NOT DETECTED NOT DETECTED   Carbapenem resistance KPC NOT DETECTED NOT DETECTED   Carbapenem resistance NDM NOT DETECTED NOT DETECTED   Carbapenem resist OXA 48 LIKE NOT DETECTED NOT DETECTED   Carbapenem resistance VIM NOT DETECTED NOT DETECTED    Mason Russell 08/04/2022  8:03 AM

## 2022-08-04 NOTE — Progress Notes (Signed)
[facility]  PROGRESS NOTE   Subjective:   Patient seen at bedside comfortable.  Objective:  Vital signs: Blood pressure (!) 112/94, pulse (!) 102, temperature 99.9 F (37.7 C), resp. rate 18, height 5\' 5"  (1.651 m), weight 77.1 kg, SpO2 95%.  Intake/Output Summary (Last 24 hours) at 08/04/2022 1148 Last data filed at 08/04/2022 0604 Gross per 24 hour  Intake --  Output 450 ml  Net -450 ml   Filed Weights   08/03/22 0949  Weight: 77.1 kg     Physical Exam: General:  No acute distress  Head:  Normocephalic, atraumatic. Moist oral mucosal membranes  Eyes:  Anicteric  Neck:  Supple  Lungs:   Clear to auscultation, normal effort  Heart:  S1S2 no rubs  Abdomen:   Soft, nontender, bowel sounds present  Extremities:  peripheral edema.  Neurologic:  Awake, alert, following commands  Skin:  No lesions  Access:     Basic Metabolic Panel: Recent Labs  Lab 08/03/22 0955 08/03/22 0956 08/04/22 0348  NA  --  132* 135  K  --  2.3* 3.5  CL  --  96* 103  CO2  --  20* 19*  GLUCOSE  --  177* 134*  BUN  --  51* 54*  CREATININE 5.07* 5.13* 5.75*  CALCIUM  --  7.9* 8.0*  MG 1.9  --   --    GFR: Estimated Creatinine Clearance: 16.8 mL/min (A) (by C-G formula based on SCr of 5.75 mg/dL (H)).  Liver Function Tests: Recent Labs  Lab 08/03/22 0956  AST 22  ALT 28  ALKPHOS 76  BILITOT 0.8  PROT 8.6*  ALBUMIN 3.3*   No results for input(s): "LIPASE", "AMYLASE" in the last 168 hours. No results for input(s): "AMMONIA" in the last 168 hours.  CBC: Recent Labs  Lab 08/03/22 0956 08/04/22 0348  WBC 20.4* 16.4*  NEUTROABS 17.5*  --   HGB 13.5 12.6*  HCT 38.4* 38.1*  MCV 85.9 89.0  PLT 224 228     HbA1C: Hgb A1c MFr Bld  Date/Time Value Ref Range Status  08/03/2022 09:55 AM 6.1 (H) 4.8 - 5.6 % Final    Comment:    (NOTE)         Prediabetes: 5.7 - 6.4         Diabetes: >6.4         Glycemic control for adults with diabetes: <7.0      Urinalysis: Recent Labs    08/03/22 1010  COLORURINE YELLOW*  LABSPEC 1.016  PHURINE 5.0  GLUCOSEU NEGATIVE  HGBUR MODERATE*  BILIRUBINUR NEGATIVE  KETONESUR NEGATIVE  PROTEINUR >=300*  NITRITE NEGATIVE  LEUKOCYTESUR LARGE*      Imaging: CT ABDOMEN PELVIS WO CONTRAST  Result Date: 08/03/2022 CLINICAL DATA:  Right lower quadrant abdominal pain EXAM: CT ABDOMEN AND PELVIS WITHOUT CONTRAST TECHNIQUE: Multidetector CT imaging of the abdomen and pelvis was performed following the standard protocol without IV contrast. RADIATION DOSE REDUCTION: This exam was performed according to the departmental dose-optimization program which includes automated exposure control, adjustment of the mA and/or kV according to patient size and/or use of iterative reconstruction technique. COMPARISON:  None Available. FINDINGS: Lower chest: There is some linear opacity seen along bases likely scar or atelectasis. No pleural effusion. Hepatobiliary: Diffuse fatty liver infiltration identified. There is some areas of sparing along the margin of the gallbladder. Gallbladder itself is nondilated. Pancreas: Unremarkable. No pancreatic ductal dilatation or surrounding inflammatory changes. Spleen: Normal in size without  focal abnormality. Adrenals/Urinary Tract: The adrenal glands are preserved. Both kidneys appears slightly enlarged and there is some perinephric stranding. No abnormal calcification seen within either kidney nor along the course of either ureter. The bladder is underdistended. Stomach/Bowel: Large bowel on this non oral contrast examination has a normal course and caliber. Mild stool. Normal appendix extends inferior to the cecum in the right lower quadrant. The stomach is underdistended. Small bowel is nondilated. Vascular/Lymphatic: No significant vascular findings are present. No enlarged abdominal or pelvic lymph nodes. Reproductive: Prostate is unremarkable. Other: No abdominal wall hernia or  abnormality. No abdominopelvic ascites. Musculoskeletal: No acute or significant osseous findings. IMPRESSION: No obstructing renal stone. Of note the kidneys appear somewhat enlarged and there is some perinephric stranding. This has a differential including an infectious or inflammatory process. If there is concern of pyelonephritis or other process process and IV contrast study may be useful to further delineate. Fatty liver infiltration. Normal appendix. Electronically Signed   By: Karen Kays M.D.   On: 08/03/2022 11:33   DG Chest 2 View  Result Date: 08/03/2022 CLINICAL DATA:  sob, eval for pna EXAM: CHEST - 2 VIEW COMPARISON:  CXR 06/24/21 FINDINGS: No pleural effusion. No pneumothorax. Low lung volumes. Likely normal cardiac and mediastinal contours when accounting for low lung volumes. No focal airspace opacity. No radiographically apparent displaced rib fractures. Visualized upper abdomen is unremarkable. Vertebral body heights are maintained IMPRESSION: No focal airspace opacity. Electronically Signed   By: Lorenza Cambridge M.D.   On: 08/03/2022 10:30     Medications:    lactated ringers 100 mL/hr at 08/03/22 1500   meropenem (MERREM) IV 500 mg (08/04/22 1133)    heparin  5,000 Units Subcutaneous Q8H   multivitamin with minerals  1 tablet Oral Daily   pantoprazole  40 mg Oral BID   sodium bicarbonate  650 mg Oral BID    Assessment/ Plan:     37 y.o. male with medical problems of gout, inflammatory arthritis, history of bilateral necrosis of femoral heads-MRI September 2023 was admitted on 08/03/2022 for lower abdominal pain, decreased urine output, generalized malaise, headache and nausea.  #1: Acute kidney injury on possible chronic kidney disease: Patient may have acute kidney injury secondary to long history of diarrhea complicated by the use of nonsteroidal anti-inflammatory drugs.  Presently urine output is marginal and has been on IV fluids.  Will continue to monitor closely.  There  is no acute need for renal replacement therapy.  #2: Pyelonephritis: Will continue the antibiotics with meropenem.  #3: Metabolic acidosis: Metabolic acidosis can be secondary to acute kidney injury complicated by diarrhea.  Will continue sodium bicarbonate supplementation.  Will need to monitor for hypokalemia.  #4: Diarrhea: Diarrhea improving.  C. difficile is negative.  Continue IV fluids with Ringer's lactate at 100 cc an hour.  Will monitor closely.   LOS: 1 Lorain Childes, MD [facility] 7/27/202411:48 AM

## 2022-08-05 ENCOUNTER — Inpatient Hospital Stay: Payer: Self-pay

## 2022-08-05 DIAGNOSIS — E872 Acidosis, unspecified: Secondary | ICD-10-CM

## 2022-08-05 LAB — RENAL FUNCTION PANEL
Albumin: 2.5 g/dL — ABNORMAL LOW (ref 3.5–5.0)
Anion gap: 15 (ref 5–15)
BUN: 60 mg/dL — ABNORMAL HIGH (ref 6–20)
CO2: 17 mmol/L — ABNORMAL LOW (ref 22–32)
Calcium: 8.2 mg/dL — ABNORMAL LOW (ref 8.9–10.3)
Chloride: 105 mmol/L (ref 98–111)
Creatinine, Ser: 6.29 mg/dL — ABNORMAL HIGH (ref 0.61–1.24)
GFR, Estimated: 11 mL/min — ABNORMAL LOW (ref >60)
Glucose, Bld: 110 mg/dL — ABNORMAL HIGH (ref 70–99)
Phosphorus: 2.7 mg/dL (ref 2.5–4.6)
Potassium: 3.8 mmol/L (ref 3.5–5.1)
Sodium: 137 mmol/L (ref 135–145)

## 2022-08-05 LAB — PROTEIN / CREATININE RATIO, URINE
Creatinine, Urine: 39 mg/dL
Protein Creatinine Ratio: 1.85 mg/mg{creat} — ABNORMAL HIGH (ref 0.00–0.15)
Total Protein, Urine: 72 mg/dL

## 2022-08-05 LAB — GLUCOSE, CAPILLARY: Glucose-Capillary: 104 mg/dL — ABNORMAL HIGH (ref 70–99)

## 2022-08-05 LAB — BRAIN NATRIURETIC PEPTIDE: B Natriuretic Peptide: 855.9 pg/mL — ABNORMAL HIGH (ref 0.0–100.0)

## 2022-08-05 MED ORDER — SODIUM BICARBONATE 650 MG PO TABS
1300.0000 mg | ORAL_TABLET | Freq: Two times a day (BID) | ORAL | Status: DC
  Administered 2022-08-05 – 2022-08-08 (×6): 1300 mg via ORAL
  Filled 2022-08-05 (×6): qty 2

## 2022-08-05 NOTE — Assessment & Plan Note (Addendum)
E. coli bacteremia. Presented with dysuria and lower abdominal pain, CT abdomen with concern of pyelonephritis.  UA positive for >300 protein, large leukocytes, many bacteria and microscopic hematuria.  Urine and blood cultures with ESBL E. coli.  No fever, leukocytosis.  Did not meet criteria for sepsis. Initially received Rocephin which was later switched with meropenem -Continue with meropenem-will be switched to ertapenem from tomorrow morning -Echocardiogram normal -MRI of thoracic and lumbar spine was negative for any osteo or discitis -Repeat blood cultures from 7/30 remain negative so far -Per ID patient will need 10 days of carbapenem, uninsured patient so outpatient therapy is not possible.  Arrangements are made at the surgery center to get daily ertapenem until 08/13/2022.  Patient will be discharged tomorrow after getting a dose of ertapenem and start his outpatient therapy from Saturday.

## 2022-08-05 NOTE — Progress Notes (Signed)
[facility]  PROGRESS NOTE   Subjective:   Awake and alert.  Ate well this morning.  Ambulating in the room. Complains of some discomfort in the abdomen. Denies any diarrhea, nausea or vomiting this morning. Denies any fever or chills or headaches. Urine output 1300 cc yesterday.  Objective:  Vital signs: Blood pressure (!) 135/93, pulse (!) 102, temperature 98.4 F (36.9 C), temperature source Oral, resp. rate 16, height 5\' 5"  (1.651 m), weight 77.1 kg, SpO2 94%.  Intake/Output Summary (Last 24 hours) at 08/05/2022 1051 Last data filed at 08/05/2022 0637 Gross per 24 hour  Intake --  Output 1300 ml  Net -1300 ml   Filed Weights   08/03/22 0949  Weight: 77.1 kg     Physical Exam: General:  No acute distress  Head:  Normocephalic, atraumatic. Moist oral mucosal membranes  Eyes:  Anicteric  Neck:  Supple  Lungs:   Clear to auscultation, normal effort  Heart:  S1S2 no rubs  Abdomen:   Soft, nontender, bowel sounds present  Extremities:  peripheral edema.  Neurologic:  Awake, alert, following commands  Skin:  No lesions  Access:     Basic Metabolic Panel: Recent Labs  Lab 08/03/22 0955 08/03/22 0956 08/04/22 0348 08/04/22 1606 08/05/22 0858  NA  --  132* 135 133* 137  K  --  2.3* 3.5 3.9 3.8  CL  --  96* 103 106 105  CO2  --  20* 19* 19* 17*  GLUCOSE  --  177* 134* 120* 110*  BUN  --  51* 54* 55* 60*  CREATININE 5.07* 5.13* 5.75* 6.28* 6.29*  CALCIUM  --  7.9* 8.0* 7.5* 8.2*  MG 1.9  --   --   --   --   PHOS  --   --   --  2.2* 2.7   GFR: Estimated Creatinine Clearance: 15.4 mL/min (A) (by C-G formula based on SCr of 6.29 mg/dL (H)).  Liver Function Tests: Recent Labs  Lab 08/03/22 0956 08/04/22 1606 08/05/22 0858  AST 22  --   --   ALT 28  --   --   ALKPHOS 76  --   --   BILITOT 0.8  --   --   PROT 8.6*  --   --   ALBUMIN 3.3* 2.5* 2.5*   No results for input(s): "LIPASE", "AMYLASE" in the last 168 hours. No results for input(s):  "AMMONIA" in the last 168 hours.  CBC: Recent Labs  Lab 08/03/22 0956 08/04/22 0348  WBC 20.4* 16.4*  NEUTROABS 17.5*  --   HGB 13.5 12.6*  HCT 38.4* 38.1*  MCV 85.9 89.0  PLT 224 228     HbA1C: Hgb A1c MFr Bld  Date/Time Value Ref Range Status  08/03/2022 09:55 AM 6.1 (H) 4.8 - 5.6 % Final    Comment:    (NOTE)         Prediabetes: 5.7 - 6.4         Diabetes: >6.4         Glycemic control for adults with diabetes: <7.0     Urinalysis: Recent Labs    08/03/22 1010  COLORURINE YELLOW*  LABSPEC 1.016  PHURINE 5.0  GLUCOSEU NEGATIVE  HGBUR MODERATE*  BILIRUBINUR NEGATIVE  KETONESUR NEGATIVE  PROTEINUR >=300*  NITRITE NEGATIVE  LEUKOCYTESUR LARGE*      Imaging: CT ABDOMEN PELVIS WO CONTRAST  Result Date: 08/03/2022 CLINICAL DATA:  Right lower quadrant abdominal pain EXAM: CT ABDOMEN AND PELVIS  WITHOUT CONTRAST TECHNIQUE: Multidetector CT imaging of the abdomen and pelvis was performed following the standard protocol without IV contrast. RADIATION DOSE REDUCTION: This exam was performed according to the departmental dose-optimization program which includes automated exposure control, adjustment of the mA and/or kV according to patient size and/or use of iterative reconstruction technique. COMPARISON:  None Available. FINDINGS: Lower chest: There is some linear opacity seen along bases likely scar or atelectasis. No pleural effusion. Hepatobiliary: Diffuse fatty liver infiltration identified. There is some areas of sparing along the margin of the gallbladder. Gallbladder itself is nondilated. Pancreas: Unremarkable. No pancreatic ductal dilatation or surrounding inflammatory changes. Spleen: Normal in size without focal abnormality. Adrenals/Urinary Tract: The adrenal glands are preserved. Both kidneys appears slightly enlarged and there is some perinephric stranding. No abnormal calcification seen within either kidney nor along the course of either ureter. The bladder is  underdistended. Stomach/Bowel: Large bowel on this non oral contrast examination has a normal course and caliber. Mild stool. Normal appendix extends inferior to the cecum in the right lower quadrant. The stomach is underdistended. Small bowel is nondilated. Vascular/Lymphatic: No significant vascular findings are present. No enlarged abdominal or pelvic lymph nodes. Reproductive: Prostate is unremarkable. Other: No abdominal wall hernia or abnormality. No abdominopelvic ascites. Musculoskeletal: No acute or significant osseous findings. IMPRESSION: No obstructing renal stone. Of note the kidneys appear somewhat enlarged and there is some perinephric stranding. This has a differential including an infectious or inflammatory process. If there is concern of pyelonephritis or other process process and IV contrast study may be useful to further delineate. Fatty liver infiltration. Normal appendix. Electronically Signed   By: Karen Kays M.D.   On: 08/03/2022 11:33     Medications:    lactated ringers 100 mL/hr at 08/05/22 0108   meropenem (MERREM) IV 500 mg (08/05/22 1006)    heparin  5,000 Units Subcutaneous Q8H   multivitamin with minerals  1 tablet Oral Daily   pantoprazole  40 mg Oral BID   sodium bicarbonate  1,300 mg Oral BID    Assessment/ Plan:     37 y.o. male with medical problems of gout, inflammatory arthritis, history of bilateral necrosis of femoral heads-MRI September 2023 was admitted on 08/03/2022 for lower abdominal pain, decreased urine output, generalized malaise, headache and nausea.   #1: Acute kidney injury on possible chronic kidney disease: Patient may have acute kidney injury secondary to long history of diarrhea complicated by the use of nonsteroidal anti-inflammatory drugs.  Presently urine output is marginal and has been on IV fluids.  Will continue to monitor closely.  If the renal indices does not improve in the next few days we need to plan for renal replacement  therapies.    #2: Pyelonephritis: Will continue the antibiotics with meropenem.   #3: Metabolic acidosis: Metabolic acidosis can be secondary to acute kidney injury complicated by diarrhea.  Will continue sodium bicarbonate supplementation.  Will need to monitor for hypokalemia.   #4: Diarrhea: Diarrhea improving.  C. difficile is negative.  Continue IV fluids with Ringer's lactate at 100 cc an hour.  #5: Proteinuria: Will need to recheck urinalysis and also check for random urine protein creatinine ratio.  Will continue to follow closely.    LOS: 2 Lorain Childes, MD [facility] 7/28/202410:51 AM

## 2022-08-05 NOTE — Assessment & Plan Note (Signed)
Resolved Watery diarrhea for the past 2 days.  No recent travel. -C. difficile colitis PCR and GI pathogen panel negative -Supportive care and hydration

## 2022-08-05 NOTE — Progress Notes (Signed)
Progress Note   Patient: Mason Russell ZOX:[id number] DOB: 1985-11-14 DOA: 08/03/2022     2 DOS: the patient was seen and examined on 08/05/2022   Brief hospital course:  Mason Russell is a 37 y.o. male with medical history significant of inflammatory arthritis, bilateral necrosis of femoral heads presented to ED with complaints of lower abdominal pain and dysuria for the past few days.   Patient was also experiencing watery diarrhea for the past 2 days.  Poor p.o. intake and appetite.  ED course and data reviewed.  Vital stable, labs pertinent for neutrophilic predominant leukocytosis at 20.4, sodium 132, potassium 2.3, chloride 96, CO2 20, blood glucose 177, BUN 51, creatinine 5.13, total protein 8.6 and albumin 3.3.  UA with significant protein urea, microscopic hematuria, pyuria and many bacteria. Chest x-ray was negative for any acute abnormality. CT abdomen and pelvis bilaterally enlarged kidneys with some perinephric stranding, concerning for infectious or inflammatory process. EKG, personally reviewed, normal sinus rhythm.   TRH was consulted for admission due to pyelonephritis. Nephrology was consulted for significant proteinuria and AKI. Blood and urine cultures were obtained and patient was started on ceftriaxone.  7/27: Vitals with temperature of 99.9, HR 102, preliminary blood cultures positive for E. coli with CTX-M gene, suspected ESBL, antibiotics switched with meropenem.  Labs with improving leukocytosis at 16.4, hypokalemia improved with potassium at 3.5 CO2 19, worsening renal function with creatinine at 5.75.  C. difficile and GI pathogen panel negative.Uric acid significantly elevated at 15.7, TSH mildly elevated at 4.867, HIV negative.  Rest of autoimmune labs pending  Autoimmune workup in June 2023 including double-stranded DNA, anti-Jo1 antibodies, ENA's, HLA-B27, Brucella antibodies, Lyme antibodies, anti-CCP antibodies, anticardiolipin antibodies was negative.  ESR  was elevated last year in June 2023 up to 83. He had CT-guided aspiration of the right sacroiliac joint in June 2023.  Nephrology is suspecting renal damage secondary to NSAID use.  Oliguric, bladder scan with no residual.  7/28: Afebrile this morning, had an episode of fever at 100.4 yesterday around 8 PM.  Elevated protein creatinine ratio at 1.85.  Urine and blood culture both growing ESBL E. coli-sensitive to carbapenems, Bactrim and pip-tazo.  Worsening renal function, borderline UOP of about  1300 recorded.  Worsening abdominal discomfort and some distention, stopping IV fluid, ruling out ascites, may be getting fluid overload.  Breathing comfortable and remained on room air. Might need to start on dialysis, if renal function shows no sign of improvement in the next few days.   Assessment and Plan: * Pyelonephritis E. coli bacteremia. Presented with dysuria and lower abdominal pain, CT abdomen with concern of pyelonephritis.  UA positive for >300 protein, large leukocytes, many bacteria and microscopic hematuria.  Urine and blood cultures with ESBL E. coli.  No fever, leukocytosis.  Did not meet criteria for sepsis. Initially received Rocephin which was later switched with meropenem -Continue with meropenem   AKI (acute kidney injury) (HCC) Patient with significant protein urea and AKI which is new from his baseline.  Worsening renal function despite getting IV fluid.  Concern of NSAID induced renal injury per nephrology.  Elevated protein creatinine ratio. Received quite a bit of IV fluid but renal function continues to deteriorate. -Stopping IV fluid -Monitor renal function -Strictly avoid all NSAID -Avoid nephrotoxins  Metabolic acidosis Worsening none anion gap metabolic acidosis likely secondary to renal disease. -Increasing the dose of bicarb tablet to 1300 twice daily -Continue to monitor  Hypokalemia Resolved. Potassium at 2.3 on admission ,  Likely secondary to GI  losses with diarrhea. -Continue to monitor and replete as needed  Diarrhea Resolved Watery diarrhea for the past 2 days.  No recent travel. -C. difficile colitis PCR and GI pathogen panel negative -Supportive care and hydration  History of inflammatory arthritis Patient was diagnosed with inflammatory arthritis in June 2023 and was evaluated by ID at that time.  He was recommended to follow-up with rheumatology for further workup, apparently did not follow-up with rheumatology. Prior workup done last year was negative -Pending ANA, ANCA, hep C, C3 and C4 labs -Need rheumatologic workup especially now he has significant protein urea  Bilateral femoral head necrosis (HCC) Patient was found to have bilateral femoral head necrosis on MRI done during prior admission in 2023. Also has significant bilateral knee pain. -Outpatient follow-up with orthopedic surgery   Subjective: Patient was complaining of worsening abdominal discomfort and distention.  No nausea or vomiting.  No diarrhea.  Slightly improved urine output as compared to yesterday.  Physical Exam: Vitals:   08/04/22 2014 08/05/22 0011 08/05/22 0623 08/05/22 0743  BP: (!) 133/95  123/87 (!) 135/93  Pulse: (!) 106 (!) 105 98 (!) 102  Resp: 18  20 16   Temp: (!) 100.4 F (38 C)  99.8 F (37.7 C) 98.4 F (36.9 C)  TempSrc: Oral  Oral Oral  SpO2: 96% 95% 94% 94%  Weight:      Height:       General.  Malnourished gentleman, in no acute distress. Pulmonary.  Lungs clear bilaterally, normal respiratory effort. CV.  Regular rate and rhythm, no JVD, rub or murmur. Abdomen.  Soft, nontender, mildly distended, BS positive. CNS.  Alert and oriented .  No focal neurologic deficit. Extremities.  No edema, no cyanosis, pulses intact and symmetrical. Psychiatry.  Judgment and insight appears normal. .   Data Reviewed: Prior data reviewed  Family Communication: Discussed with patient  Disposition: Status is: Inpatient Remains  inpatient appropriate because: Severity of illness  Planned Discharge Destination: Home  DVT prophylaxis.  Subcu heparin Time spent: 45 minutes  This record has been created using Conservation officer, historic buildings. Errors have been sought and corrected,but may not always be located. Such creation errors do not reflect on the standard of care.   Author: Arnetha Courser, MD 08/05/2022 11:46 AM  For on call review [email or web address].

## 2022-08-05 NOTE — Assessment & Plan Note (Addendum)
Improved, likely secondary to renal disease. -Discontinue bicarb tablets and monitor

## 2022-08-05 NOTE — Plan of Care (Signed)

## 2022-08-05 NOTE — Assessment & Plan Note (Signed)
Resolved. Potassium at 2.3 on admission ,  Likely secondary to GI losses with diarrhea. -Continue to monitor and replete as needed

## 2022-08-05 NOTE — Plan of Care (Signed)
°  Problem: Education: Goal: Knowledge of General Education information will improve Description: Including pain rating scale, medication(s)/side effects and non-pharmacologic comfort measures Outcome: Progressing   Problem: Clinical Measurements: Goal: Ability to maintain clinical measurements within normal limits will improve Outcome: Progressing Goal: Will remain free from infection Outcome: Progressing Goal: Diagnostic test results will improve Outcome: Progressing   Problem: Nutrition: Goal: Adequate nutrition will be maintained Outcome: Progressing

## 2022-08-05 NOTE — Assessment & Plan Note (Addendum)
Patient with significant protein urea and AKI which is new from his baseline.  Worsening renal function despite getting IV fluid.  Concern of NSAID induced renal injury per nephrology.  Elevated protein creatinine ratio.  ANA normal, complement C3 and C4 mildly elevated, ANCA negative. Renal function started improving with improvement in UOP Nephrology is on board -Monitor renal function -Strictly avoid all NSAID -Avoid nephrotoxins

## 2022-08-06 ENCOUNTER — Inpatient Hospital Stay: Admit: 2022-08-06 | Payer: Self-pay

## 2022-08-06 ENCOUNTER — Inpatient Hospital Stay (HOSPITAL_COMMUNITY)
Admit: 2022-08-06 | Discharge: 2022-08-06 | Disposition: A | Discharge status: Home / Self Care | Payer: Self-pay | Attending: Internal Medicine | Admitting: Internal Medicine

## 2022-08-06 DIAGNOSIS — R7881 Bacteremia: Secondary | ICD-10-CM

## 2022-08-06 LAB — GLUCOSE, CAPILLARY
Glucose-Capillary: 117 mg/dL — ABNORMAL HIGH (ref 70–99)
Glucose-Capillary: 106 mg/dL — ABNORMAL HIGH (ref 70–99)

## 2022-08-06 LAB — ECHOCARDIOGRAM COMPLETE
AR max vel: 2.52 cm2
AV Area VTI: 2.19 cm2
AV Area mean vel: 2.31 cm2
AV Mean grad: 2 mmHg
AV Peak grad: 3.4 mmHg
Ao pk vel: 0.92 m/s
Area-P 1/2: 3.72 cm2
Height: 65 in
S' Lateral: 3.4 cm
Weight: 2720 oz

## 2022-08-06 NOTE — Plan of Care (Signed)
  Problem: Education: Goal: Knowledge of General Education information will improve Description: Including pain rating scale, medication(s)/side effects and non-pharmacologic comfort measures Outcome: Progressing   Problem: Clinical Measurements: Goal: Ability to maintain clinical measurements within normal limits will improve Outcome: Progressing Goal: Will remain free from infection Outcome: Progressing   Problem: Activity: Goal: Risk for activity intolerance will decrease Outcome: Progressing   Problem: Elimination: Goal: Will not experience complications related to urinary retention Outcome: Progressing

## 2022-08-06 NOTE — Progress Notes (Signed)
*  PRELIMINARY RESULTS* Echocardiogram 2D Echocardiogram has been performed.  Cristela Blue 08/06/2022, 11:59 AM

## 2022-08-06 NOTE — TOC Initial Note (Signed)
Transition of Care [facility]) - Initial/Assessment Note    Patient Details  Name: Mason Russell MRN: [id number] Date of Birth: [birth year]  Transition of Care [facility]) CM/SW Contact:    Allena Katz, LCSW Phone Number: 08/06/2022, 12:02 PM  Clinical Narrative:    Pt admitted from home with  Pyelonephritis. Pt has been here for 3 months and was previously living in [place] Pt is uninsured and will likely need to utilize [facility] outpatient p[pharmacy upon discharge. Per MD notes, we are monitoring renal function to see if pt will need dialysis. CSW to follow up with patient when he returns to the floor.           Patient Goals and CMS Choice            Expected Discharge Plan and Services                                              Prior Living Arrangements/Services                       Activities of Daily Living      Permission Sought/Granted                  Emotional Assessment              Admission diagnosis:  Pyelonephritis [N12] AKI (acute kidney injury) (HCC) [N17.9] Patient Active Problem List   Diagnosis Date Noted   Metabolic acidosis 08/05/2022   Pyelonephritis 08/03/2022   AKI (acute kidney injury) (HCC) 08/03/2022   Diarrhea 08/03/2022   Bilateral femoral head necrosis (HCC) 08/03/2022   Unable to ambulate 09/28/2021   GERD without esophagitis 09/28/2021   Sciatica of right side    History of inflammatory arthritis 06/28/2021   Septic arthritis of sacroiliac joint (HCC) 06/24/2021   Obesity with body mass index (BMI) of 30.0 to 39.9 06/24/2021   Hypokalemia 06/24/2021   Gout    Acute lumbar radiculopathy    Sepsis [facility])    PCP:  System, Provider Not In Pharmacy:   [id number] [place], [place] - [address] AT [facility] OF [address] [place] [address] Phone: [phone] Fax: [phone]  [facility] - [facility] [address] [place] [address] Phone: [phone] Fax: [phone]     Social Determinants of Health (SDOH) Social History: SDOH Screenings   Food Insecurity: No Food Insecurity (09/29/2021)  Housing: Low Risk  (09/29/2021)  Transportation Needs: No Transportation Needs (09/29/2021)  Utilities: Not At Risk (09/29/2021)  Tobacco Use: Medium Risk (08/03/2022)   SDOH Interventions:     Readmission Risk Interventions     No data to display

## 2022-08-06 NOTE — Progress Notes (Signed)
Progress Note   Patient: Mason Russell AVW:[id number] DOB: September 10, 1985 DOA: 08/03/2022     3 DOS: the patient was seen and examined on 08/06/2022   Brief hospital course:  Mason Russell is a 37 y.o. male with medical history significant of inflammatory arthritis, bilateral necrosis of femoral heads presented to ED with complaints of lower abdominal pain and dysuria for the past few days.   Patient was also experiencing watery diarrhea for the past 2 days.  Poor p.o. intake and appetite.  ED course and data reviewed.  Vital stable, labs pertinent for neutrophilic predominant leukocytosis at 20.4, sodium 132, potassium 2.3, chloride 96, CO2 20, blood glucose 177, BUN 51, creatinine 5.13, total protein 8.6 and albumin 3.3.  UA with significant protein urea, microscopic hematuria, pyuria and many bacteria. Chest x-ray was negative for any acute abnormality. CT abdomen and pelvis bilaterally enlarged kidneys with some perinephric stranding, concerning for infectious or inflammatory process. EKG, personally reviewed, normal sinus rhythm.   TRH was consulted for admission due to pyelonephritis. Nephrology was consulted for significant proteinuria and AKI. Blood and urine cultures were obtained and patient was started on ceftriaxone.  7/27: Vitals with temperature of 99.9, HR 102, preliminary blood cultures positive for E. coli with CTX-M gene, suspected ESBL, antibiotics switched with meropenem.  Labs with improving leukocytosis at 16.4, hypokalemia improved with potassium at 3.5 CO2 19, worsening renal function with creatinine at 5.75.  C. difficile and GI pathogen panel negative.Uric acid significantly elevated at 15.7, TSH mildly elevated at 4.867, HIV negative.  Rest of autoimmune labs pending  Autoimmune workup in June 2023 including double-stranded DNA, anti-Jo1 antibodies, ENA's, HLA-B27, Brucella antibodies, Lyme antibodies, anti-CCP antibodies, anticardiolipin antibodies was negative.  ESR  was elevated last year in June 2023 up to 83. He had CT-guided aspiration of the right sacroiliac joint in June 2023.  Nephrology is suspecting renal damage secondary to NSAID use.  Oliguric, bladder scan with no residual.  7/28: Afebrile this morning, had an episode of fever at 100.4 yesterday around 8 PM.  Elevated protein creatinine ratio at 1.85.  Urine and blood culture both growing ESBL E. coli-sensitive to carbapenems, Bactrim and pip-tazo.  Worsening renal function, borderline UOP of about  1300 recorded.  Worsening abdominal discomfort and some distention, stopping IV fluid, ruling out ascites, may be getting fluid overload.  Breathing comfortable and remained on room air. Might need to start on dialysis, if renal function shows no sign of improvement in the next few days.  7/29: Hemodynamically stable.  Remained afebrile over the past 24 hours.  Renal function seems stable or at least plateaued.  Remains oliguric.  Elevated BNP at 855 but patient also has renal failure.  Echocardiogram was normal.   Assessment and Plan: * Pyelonephritis E. coli bacteremia. Presented with dysuria and lower abdominal pain, CT abdomen with concern of pyelonephritis.  UA positive for >300 protein, large leukocytes, many bacteria and microscopic hematuria.  Urine and blood cultures with ESBL E. coli.  No fever, leukocytosis.  Did not meet criteria for sepsis. Initially received Rocephin which was later switched with meropenem -Continue with meropenem -Echocardiogram normal   AKI (acute kidney injury) (HCC) Patient with significant protein urea and AKI which is new from his baseline.  Worsening renal function despite getting IV fluid.  Concern of NSAID induced renal injury per nephrology.  Elevated protein creatinine ratio.  ANA normal, complement C3 and C4 mildly elevated, ANCA pending. Received quite a bit of IV fluid but renal  function continues to deteriorate. -Stopping IV fluid -Monitor renal  function -Strictly avoid all NSAID -Avoid nephrotoxins  Metabolic acidosis Some improvement in metabolic acidosis likely secondary to renal disease. -Continue bicarb tablet at 1300 twice daily -Continue to monitor  Hypokalemia Resolved. Potassium at 2.3 on admission ,  Likely secondary to GI losses with diarrhea. -Continue to monitor and replete as needed  Diarrhea Resolved Watery diarrhea for the past 2 days.  No recent travel. -C. difficile colitis PCR and GI pathogen panel negative -Supportive care and hydration  History of inflammatory arthritis Patient was diagnosed with inflammatory arthritis in June 2023 and was evaluated by ID at that time.  He was recommended to follow-up with rheumatology for further workup, apparently did not follow-up with rheumatology. Prior workup done last year was negative -Pending ANA, ANCA, hep C, C3 and C4 labs -Need rheumatologic workup especially now he has significant protein urea  Bilateral femoral head necrosis (HCC) Patient was found to have bilateral femoral head necrosis on MRI done during prior admission in 2023. Also has significant bilateral knee pain. -Outpatient follow-up with orthopedic surgery   Subjective: Patient was seen and examined today.  No new complaint except stating that he is trying his best to pee more but he cannot pee much.  Physical Exam: Vitals:   08/05/22 1615 08/05/22 2204 08/06/22 0450 08/06/22 0818  BP: (!) 134/90 (!) 138/90 (!) 127/96 (!) 128/94  Pulse: (!) 105 (!) 105 99 98  Resp: 18 20 20 20   Temp: 98.3 F (36.8 C) 97.7 F (36.5 C) 98.8 F (37.1 C) 98 F (36.7 C)  TempSrc:   Oral Oral  SpO2: 97% 91% 100% 98%  Weight:      Height:       General.  Malnourished gentleman, in no acute distress. Pulmonary.  Lungs clear bilaterally, normal respiratory effort. CV.  Regular rate and rhythm, no JVD, rub or murmur. Abdomen.  Soft, nontender, nondistended, BS positive. CNS.  Alert and oriented .  No  focal neurologic deficit. Extremities.  No edema, no cyanosis, pulses intact and symmetrical. Psychiatry.  Judgment and insight appears normal.   Data Reviewed: Prior data reviewed  Family Communication: Discussed with patient  Disposition: Status is: Inpatient Remains inpatient appropriate because: Severity of illness  Planned Discharge Destination: Home  DVT prophylaxis.  Subcu heparin Time spent: 42 minutes  This record has been created using Conservation officer, historic buildings. Errors have been sought and corrected,but may not always be located. Such creation errors do not reflect on the standard of care.   Author: Arnetha Courser, MD 08/06/2022 1:16 PM  For on call review [email or web address].

## 2022-08-06 NOTE — Progress Notes (Signed)
[facility]  PROGRESS NOTE   Subjective:   Resting quietly in bed Denies nausea and diarrhea Appetite appropriate  Creatinine 5.73 Urine output 1100 cc today  Objective:  Vital signs: Blood pressure (!) 128/94, pulse 98, temperature 98 F (36.7 C), temperature source Oral, resp. rate 20, height 5\' 5"  (1.651 m), weight 77.1 kg, SpO2 98%.  Intake/Output Summary (Last 24 hours) at 08/06/2022 1319 Last data filed at 08/06/2022 1026 Gross per 24 hour  Intake 240 ml  Output 1350 ml  Net -1110 ml   Filed Weights   08/03/22 0949  Weight: 77.1 kg     Physical Exam: General:  No acute distress  Head:  Normocephalic, atraumatic. Moist oral mucosal membranes  Eyes:  Anicteric  Lungs:   Clear to auscultation, normal effort  Heart:  S1S2 no rubs  Abdomen:   Soft, nontender, bowel sounds present  Extremities:  No peripheral edema.  Neurologic:  Awake, alert, following commands  Skin:  No lesions  Access: None    Basic Metabolic Panel: Recent Labs  Lab 08/03/22 0955 08/03/22 0956 08/04/22 0348 08/04/22 1606 08/05/22 0858 08/06/22 0346  NA  --  132* 135 133* 137 139  K  --  2.3* 3.5 3.9 3.8 4.1  CL  --  96* 103 106 105 108  CO2  --  20* 19* 19* 17* 20*  GLUCOSE  --  177* 134* 120* 110* 115*  BUN  --  51* 54* 55* 60* 55*  CREATININE 5.07* 5.13* 5.75* 6.28* 6.29* 5.73*  CALCIUM  --  7.9* 8.0* 7.5* 8.2* 8.3*  MG 1.9  --   --   --   --   --   PHOS  --   --   --  2.2* 2.7 3.3   GFR: Estimated Creatinine Clearance: 16.9 mL/min (A) (by C-G formula based on SCr of 5.73 mg/dL (H)).  Liver Function Tests: Recent Labs  Lab 08/03/22 0956 08/04/22 1606 08/05/22 0858 08/06/22 0346  AST 22  --   --   --   ALT 28  --   --   --   ALKPHOS 76  --   --   --   BILITOT 0.8  --   --   --   PROT 8.6*  --   --   --   ALBUMIN 3.3* 2.5* 2.5* 2.4*   No results for input(s): "LIPASE", "AMYLASE" in the last 168 hours. No results for input(s): "AMMONIA" in the last 168  hours.  CBC: Recent Labs  Lab 08/03/22 0956 08/04/22 0348  WBC 20.4* 16.4*  NEUTROABS 17.5*  --   HGB 13.5 12.6*  HCT 38.4* 38.1*  MCV 85.9 89.0  PLT 224 228     HbA1C: Hgb A1c MFr Bld  Date/Time Value Ref Range Status  08/03/2022 09:55 AM 6.1 (H) 4.8 - 5.6 % Final    Comment:    (NOTE)         Prediabetes: 5.7 - 6.4         Diabetes: >6.4         Glycemic control for adults with diabetes: <7.0     Urinalysis: No results for input(s): "COLORURINE", "LABSPEC", "PHURINE", "GLUCOSEU", "HGBUR", "BILIRUBINUR", "KETONESUR", "PROTEINUR", "UROBILINOGEN", "NITRITE", "LEUKOCYTESUR" in the last 72 hours.  Invalid input(s): "APPERANCEUR"     Imaging: ECHOCARDIOGRAM COMPLETE  Result Date: 08/06/2022    ECHOCARDIOGRAM REPORT   Patient Name:   Mason Russell Date of Exam: 08/06/2022 Medical Rec #:  [id number]  Height:       65.0 in Accession #:    [id number]      Weight:       170.0 lb Date of Birth:  Apr 19, 1985       BSA:          1.846 m Patient Age:    36 years        BP:           128/94 mmHg Patient Gender: M               HR:           98 bpm. Exam Location:  [facility] Procedure: 2D Echo, Color Doppler and Cardiac Doppler Indications:     bacteremia R78.81  History:         Patient has no prior history of Echocardiogram examinations.  Sonographer:     Cristela Blue Referring Phys:  [id number] [facility] AMIN Diagnosing Phys: Lorine Bears MD IMPRESSIONS  1. Left ventricular ejection fraction, by estimation, is 55 to 60%. The left ventricle has normal function. The left ventricle has no regional wall motion abnormalities. Left ventricular diastolic parameters were normal.  2. Right ventricular systolic function is normal. The right ventricular size is normal. Tricuspid regurgitation signal is inadequate for assessing PA pressure.  3. The mitral valve is normal in structure. No evidence of mitral valve regurgitation. No evidence of mitral stenosis.  4. The aortic valve is normal in structure.  Aortic valve regurgitation is not visualized. No aortic stenosis is present. Conclusion(s)/Recommendation(s): No evidence of valvular vegetations on this transthoracic echocardiogram. FINDINGS  Left Ventricle: Left ventricular ejection fraction, by estimation, is 55 to 60%. The left ventricle has normal function. The left ventricle has no regional wall motion abnormalities. The left ventricular internal cavity size was normal in size. There is  no left ventricular hypertrophy. Left ventricular diastolic parameters were normal. Right Ventricle: The right ventricular size is normal. No increase in right ventricular wall thickness. Right ventricular systolic function is normal. Tricuspid regurgitation signal is inadequate for assessing PA pressure. Left Atrium: Left atrial size was normal in size. Right Atrium: Right atrial size was normal in size. Pericardium: There is no evidence of pericardial effusion. Mitral Valve: The mitral valve is normal in structure. No evidence of mitral valve regurgitation. No evidence of mitral valve stenosis. Tricuspid Valve: The tricuspid valve is normal in structure. Tricuspid valve regurgitation is trivial. No evidence of tricuspid stenosis. Aortic Valve: The aortic valve is normal in structure. Aortic valve regurgitation is not visualized. No aortic stenosis is present. Aortic valve mean gradient measures 2.0 mmHg. Aortic valve peak gradient measures 3.4 mmHg. Aortic valve area, by VTI measures 2.19 cm. Pulmonic Valve: The pulmonic valve was normal in structure. Pulmonic valve regurgitation is not visualized. No evidence of pulmonic stenosis. Aorta: The aortic root is normal in size and structure. Venous: The inferior vena cava was not well visualized. IAS/Shunts: No atrial level shunt detected by color flow Doppler.  LEFT VENTRICLE PLAX 2D LVIDd:         4.70 cm   Diastology LVIDs:         3.40 cm   LV e' medial:    10.20 cm/s LV PW:         1.50 cm   LV E/e' medial:  6.3 LV IVS:         1.00 cm   LV e' lateral:   12.70 cm/s LVOT diam:     2.10 cm  LV E/e' lateral: 5.0 LV SV:         39 LV SV Index:   21 LVOT Area:     3.46 cm  RIGHT VENTRICLE RV Basal diam:  3.90 cm RV Mid diam:    3.10 cm RV S prime:     16.00 cm/s LEFT ATRIUM             Index        RIGHT ATRIUM           Index LA diam:        4.30 cm 2.33 cm/m   RA Area:     13.30 cm LA Vol (A2C):   47.3 ml 25.62 ml/m  RA Volume:   29.10 ml  15.76 ml/m LA Vol (A4C):   29.9 ml 16.20 ml/m LA Biplane Vol: 40.3 ml 21.83 ml/m  AORTIC VALVE AV Area (Vmax):    2.52 cm AV Area (Vmean):   2.31 cm AV Area (VTI):     2.19 cm AV Vmax:           92.40 cm/s AV Vmean:          69.250 cm/s AV VTI:            0.179 m AV Peak Grad:      3.4 mmHg AV Mean Grad:      2.0 mmHg LVOT Vmax:         67.10 cm/s LVOT Vmean:        46.200 cm/s LVOT VTI:          0.113 m LVOT/AV VTI ratio: 0.63  AORTA Ao Root diam: 3.00 cm MITRAL VALVE               TRICUSPID VALVE MV Area (PHT): 3.72 cm    TR Peak grad:   23.4 mmHg MV Decel Time: 204 msec    TR Vmax:        242.00 cm/s MV E velocity: 63.90 cm/s MV A velocity: 60.30 cm/s  SHUNTS MV E/A ratio:  1.06        Systemic VTI:  0.11 m                            Systemic Diam: 2.10 cm Lorine Bears MD Electronically signed by Lorine Bears MD Signature Date/Time: 08/06/2022/12:28:39 PM    Final    US Abdomen Limited RUQ (LIVER/GB)  Result Date: 08/05/2022 CLINICAL DATA:  [id number] Abdominal distention [id number] EXAM: ULTRASOUND ABDOMEN LIMITED RIGHT UPPER QUADRANT COMPARISON:  August 03, 2022 FINDINGS: Gallbladder: Layering sludge. No wall thickening. No sonographic Murphy sign noted by sonographer. Common bile duct: Diameter: Visualized portion measures 9 mm, dilated. This is increased since recent CT. Liver: No focal lesion identified. Diffusely increased in parenchymal echogenicity. Portal vein is patent on color Doppler imaging with normal direction of blood flow towards the liver. Other: None. IMPRESSION: 1. The  common bile duct is dilated measuring up to 9 mm. Recommend correlation with LFTs. If concern for biliary obstruction, recommend dedicated hepatic MRI with MRCP. 2. Hepatic steatosis. 3. Biliary sludge. Electronically Signed   By: Meda Klinefelter M.D.   On: 08/05/2022 14:59     Medications:    meropenem (MERREM) IV 500 mg (08/06/22 0924)    heparin  5,000 Units Subcutaneous Q8H   multivitamin with minerals  1 tablet Oral Daily   pantoprazole  40 mg Oral BID   sodium bicarbonate  1,300  mg Oral BID    Assessment/ Plan:     37 y.o. male with medical problems of gout, inflammatory arthritis, history of bilateral necrosis of femoral heads-MRI September 2023 was admitted on 08/03/2022 for lower abdominal pain, decreased urine output, generalized malaise, headache and nausea.   #1: Acute kidney injury with proteinuria on possible chronic kidney disease: Patient may have acute kidney injury secondary to long history of diarrhea complicated by the use of nonsteroidal anti-inflammatory drugs.   Serologies negative, awaiting ANCA  Creatinine and UOP has improved today  Would consider renal biopsy but will defer due to present infection.   Continue IV hydration and avoid nephrotoxic agents  No acute need for dialysis at this time  #2: Pyelonephritis: Blood culture positive for E Coli bacteremia. Continue antibiotics with meropenem.   #3: Metabolic acidosis: Metabolic acidosis can be secondary to acute kidney injury complicated by diarrhea.    Correcting with oral supplementation   #4: Diarrhea: Diarrhea improving.  C. difficile  negative.  Continue IVF       LOS: 3 Wendee Beavers, MD [facility] 7/29/20241:19 PM

## 2022-08-07 ENCOUNTER — Inpatient Hospital Stay: Payer: Self-pay

## 2022-08-07 DIAGNOSIS — M549 Dorsalgia, unspecified: Secondary | ICD-10-CM | POA: Insufficient documentation

## 2022-08-07 LAB — GLUCOSE, CAPILLARY
Glucose-Capillary: 133 mg/dL — ABNORMAL HIGH (ref 70–99)
Glucose-Capillary: 120 mg/dL — ABNORMAL HIGH (ref 70–99)
Glucose-Capillary: 131 mg/dL — ABNORMAL HIGH (ref 70–99)

## 2022-08-07 LAB — HCV INTERPRETATION

## 2022-08-07 MED ORDER — SODIUM CHLORIDE 0.9 % IV SOLN
1.0000 g | Freq: Two times a day (BID) | INTRAVENOUS | Status: DC
  Administered 2022-08-07 – 2022-08-09 (×5): 1 g via INTRAVENOUS
  Filled 2022-08-07 (×5): qty 20

## 2022-08-07 NOTE — Progress Notes (Signed)
Progress Note   Patient: Mason Russell ZOX:[id number] DOB: 1985/09/19 DOA: 08/03/2022     4 DOS: the patient was seen and examined on 08/07/2022   Brief hospital course:  Mason Russell is a 37 y.o. male with medical history significant of inflammatory arthritis, bilateral necrosis of femoral heads presented to ED with complaints of lower abdominal pain and dysuria for the past few days.   Patient was also experiencing watery diarrhea for the past 2 days.  Poor p.o. intake and appetite.  ED course and data reviewed.  Vital stable, labs pertinent for neutrophilic predominant leukocytosis at 20.4, sodium 132, potassium 2.3, chloride 96, CO2 20, blood glucose 177, BUN 51, creatinine 5.13, total protein 8.6 and albumin 3.3.  UA with significant protein urea, microscopic hematuria, pyuria and many bacteria. Chest x-ray was negative for any acute abnormality. CT abdomen and pelvis bilaterally enlarged kidneys with some perinephric stranding, concerning for infectious or inflammatory process. EKG, personally reviewed, normal sinus rhythm.   TRH was consulted for admission due to pyelonephritis. Nephrology was consulted for significant proteinuria and AKI. Blood and urine cultures were obtained and patient was started on ceftriaxone.  7/27: Vitals with temperature of 99.9, HR 102, preliminary blood cultures positive for E. coli with CTX-M gene, suspected ESBL, antibiotics switched with meropenem.  Labs with improving leukocytosis at 16.4, hypokalemia improved with potassium at 3.5 CO2 19, worsening renal function with creatinine at 5.75.  C. difficile and GI pathogen panel negative.Uric acid significantly elevated at 15.7, TSH mildly elevated at 4.867, HIV negative.  Rest of autoimmune labs pending  Autoimmune workup in June 2023 including double-stranded DNA, anti-Jo1 antibodies, ENA's, HLA-B27, Brucella antibodies, Lyme antibodies, anti-CCP antibodies, anticardiolipin antibodies was negative.  ESR  was elevated last year in June 2023 up to 83. He had CT-guided aspiration of the right sacroiliac joint in June 2023.  Nephrology is suspecting renal damage secondary to NSAID use.  Oliguric, bladder scan with no residual.  7/28: Afebrile this morning, had an episode of fever at 100.4 yesterday around 8 PM.  Elevated protein creatinine ratio at 1.85.  Urine and blood culture both growing ESBL E. coli-sensitive to carbapenems, Bactrim and pip-tazo.  Worsening renal function, borderline UOP of about  1300 recorded.  Worsening abdominal discomfort and some distention, stopping IV fluid, ruling out ascites, may be getting fluid overload.  Breathing comfortable and remained on room air. Might need to start on dialysis, if renal function shows no sign of improvement in the next few days.  7/29: Hemodynamically stable.  Remained afebrile over the past 24 hours.  Renal function seems stable or at least plateaued.  Remains oliguric.  Elevated BNP at 855 but patient also has renal failure.  Echocardiogram was normal.  7/30: Vital stable.  Good urinary output yesterday, 3400 recorded with improvement in urinary function.  Complaining of pretty significant mid to lower back pain with some tenderness, ordered thoracic and lumbar spine MRI to rule out any osteo or discitis.  Also involving ID to help with type and duration of antibiotic.   Assessment and Plan: * Pyelonephritis E. coli bacteremia. Presented with dysuria and lower abdominal pain, CT abdomen with concern of pyelonephritis.  UA positive for >300 protein, large leukocytes, many bacteria and microscopic hematuria.  Urine and blood cultures with ESBL E. coli.  No fever, leukocytosis.  Did not meet criteria for sepsis. Initially received Rocephin which was later switched with meropenem -Continue with meropenem -Echocardiogram normal -Getting thoracic and lumbar MRI to rule out  osteo and discitis due to new new onset pain and tenderness -ID consult  to help with type and duration of antibiotic   AKI (acute kidney injury) (HCC) Patient with significant protein urea and AKI which is new from his baseline.  Worsening renal function despite getting IV fluid.  Concern of NSAID induced renal injury per nephrology.  Elevated protein creatinine ratio.  ANA normal, complement C3 and C4 mildly elevated, ANCA pending. Received quite a bit of IV fluid but renal function continues to deteriorate. Renal function started improving with improvement in UOP Nephrology is on board -Monitor renal function -Strictly avoid all NSAID -Avoid nephrotoxins  Metabolic acidosis Some improvement in metabolic acidosis likely secondary to renal disease. -Continue bicarb tablet at 1300 twice daily -Continue to monitor  Hypokalemia Resolved. Potassium at 2.3 on admission ,  Likely secondary to GI losses with diarrhea. -Continue to monitor and replete as needed  Diarrhea Resolved Watery diarrhea for the past 2 days.  No recent travel. -C. difficile colitis PCR and GI pathogen panel negative -Supportive care and hydration  History of inflammatory arthritis Patient was diagnosed with inflammatory arthritis in June 2023 and was evaluated by ID at that time.  He was recommended to follow-up with rheumatology for further workup, apparently did not follow-up with rheumatology. Prior workup done last year was negative -Pending ANA, ANCA, hep C, C3 and C4 labs -Need rheumatologic workup especially now he has significant protein urea  Bilateral femoral head necrosis (HCC) Patient was found to have bilateral femoral head necrosis on MRI done during prior admission in 2023. Also has significant bilateral knee pain. -Outpatient follow-up with orthopedic surgery  Back pain Patient with new onset back pain involving mid to lower back, along with tenderness. Has positive blood cultures. -MRI of thoracic and lumbar spine ordered   Subjective: Patient was  complaining of mid to lower back pain and stating that it is causing even difficulty lying down.  Started during current hospitalization.  He was also experiencing some hip pain which is chronic.  Physical Exam: Vitals:   08/06/22 1936 08/07/22 0016 08/07/22 0525 08/07/22 1137  BP: (!) 125/90 113/81 (!) 121/91 121/89  Pulse: (!) 108 (!) 102 82 84  Resp: 18 18 16 20   Temp: 100.1 F (37.8 C) 98.2 F (36.8 C) 97.8 F (36.6 C) 98.4 F (36.9 C)  TempSrc: Oral Oral Oral Oral  SpO2: 99% 97% 98% 96%  Weight:      Height:       General.  Malnourished gentleman, in no acute distress. Pulmonary.  Lungs clear bilaterally, normal respiratory effort. CV.  Regular rate and rhythm, no JVD, rub or murmur. Abdomen.  Soft, nontender, nondistended, BS positive. CNS.  Alert and oriented .  No focal neurologic deficit. Extremities.  No edema, no cyanosis, pulses intact and symmetrical.  Some tenderness along lower thoracic and upper lumbar region. Psychiatry.  Judgment and insight appears normal.   Data Reviewed: Prior data reviewed  Family Communication: Discussed with patient  Disposition: Status is: Inpatient Remains inpatient appropriate because: Severity of illness  Planned Discharge Destination: Home  DVT prophylaxis.  Subcu heparin Time spent: 45 minutes  This record has been created using Conservation officer, historic buildings. Errors have been sought and corrected,but may not always be located. Such creation errors do not reflect on the standard of care.   Author: Arnetha Courser, MD 08/07/2022 3:19 PM  For on call review [email or web address].

## 2022-08-07 NOTE — Progress Notes (Signed)
PHARMACY NOTE:  ANTIMICROBIAL RENAL DOSAGE ADJUSTMENT  Current antimicrobial regimen includes a mismatch between antimicrobial dosage and estimated renal function.  As per policy approved by the Pharmacy & Therapeutics and Medical Executive Committees, the antimicrobial dosage will be adjusted accordingly.  Current antimicrobial dosage:  meropenem 500mg  IV q12  Indication: ESBL E coli bacteremia and UTI  Renal Function:  Estimated Creatinine Clearance: 24.8 mL/min (A) (by C-G formula based on SCr of 3.9 mg/dL (H)). []      On intermittent HD, scheduled: []      On CRRT    Antimicrobial dosage has been changed to:  meropenem 1gm IV q12h  Additional comments: Renal function looks to be recovering from AKI.  UOP 7/29 >3L   Thank you for allowing pharmacy to be a part of this patient's care.  Juliette Alcide, PharmD, BCPS, BCIDP Work Cell: [phone] 08/07/2022 9:12 AM

## 2022-08-07 NOTE — Plan of Care (Signed)

## 2022-08-07 NOTE — Assessment & Plan Note (Signed)
Patient with new onset back pain involving mid to lower back, along with tenderness. Has positive blood cultures. -MRI of thoracic and lumbar spine with some disc protrusions but negative for osteo or discitis -PT evaluation and treatment

## 2022-08-07 NOTE — Consult Note (Signed)
NAME: Mason Russell  DOB: April 16, 1985  MRN: [id number]  Date/Time: 08/07/2022 4:51 PM  REQUESTING PROVIDER: Dr.Amin Subjective:  REASON FOR CONSULT: ESBL ecoli bacteremia and UTI ? Mason Russell is a 37 y.o. 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 There are additional values that can be seen in Synopsis.   Latest Reference Range & Units 08/03/22  WBC 4.0 - 10.5 K/uL 20.4 (H)  Hemoglobin 13.0 - 17.0 g/dL 25.9  HCT 56.3 - 87.5 % 38.4 (L)  Platelets 150 - 400 K/uL 224  Creatinine 0.61 - 1.24 mg/dL 6.43 (H)   Blood and urine culture sent As he had diarrhea stool sent for cdiff and GI panel PCR and cdiff were negative I am asked to see the patient as blood culture os positive for ESBL ecoli Urine culture is the same Pt is on IV meropenem Pt went to [place] 3 months ago and go sick with diarrhea, which resolved after 2 weeks HE brought home meds for his arthritis. He does not know what they are  Past Medical History:  Diagnosis Date   Gout    Obesity with body mass index (BMI) of 30.0 to 39.9    Scoliosis     Past Surgical History:  Procedure Laterality Date   NO PAST SURGERIES      Social History   Socioeconomic History   Marital status: Single    Spouse name: Not on file   Number of children: Not on file   Years of education: Not on file   Highest education level: Not on file  Occupational History   Not on file  Tobacco Use   Smoking status: Former    Types: Cigarettes   Smokeless tobacco: Never  Vaping Use   Vaping status: Never  Used  Substance and Sexual Activity   Alcohol use: Yes    Comment: occasionally   Drug use: Yes    Types: Marijuana   Sexual activity: Not on file  Other Topics Concern   Not on file  Social History Narrative   Not on file   Social Determinants of Health   Financial Resource Strain: Not on file  Food Insecurity: No Food Insecurity (09/29/2021)   Hunger Vital Sign    Worried About Running Out of Food in the Last Year: Never true    Ran Out of Food in the Last Year: Never true  Transportation Needs: No Transportation Needs (09/29/2021)   PRAPARE - Administrator, Civil Service (Medical): No    Lack of Transportation (Non-Medical): No  Physical Activity: Not on file  Stress: Not on file  Social Connections: Not on file  Intimate Partner Violence: Not At Risk (09/29/2021)   Humiliation, Afraid, Rape, and Kick questionnaire    Fear of Current or Ex-Partner: No    Emotionally Abused: No    Physically Abused: No    Sexually Abused: No    Family History  Problem Relation Age of Onset   Cancer Mother  No Known Allergies I? Current Facility-Administered Medications  Medication Dose Route Frequency Provider Last Rate Last Admin   acetaminophen (TYLENOL) tablet 650 mg  650 mg Oral Q6H PRN Arnetha Courser, MD       Or   acetaminophen (TYLENOL) suppository 650 mg  650 mg Rectal Q6H PRN Arnetha Courser, MD       heparin injection 5,000 Units  5,000 Units Subcutaneous Q8H Arnetha Courser, MD   5,000 Units at 08/07/22 1429   HYDROcodone-acetaminophen (NORCO/VICODIN) 5-325 MG per tablet 1-2 tablet  1-2 tablet Oral Q4H PRN Arnetha Courser, MD   2 tablet at 08/07/22 0949   meropenem (MERREM) 1 g in sodium chloride 0.9 % 100 mL IVPB  1 g Intravenous Q12H Aleda Grana, RPH 200 mL/hr at 08/07/22 0951 1 g at 08/07/22 0951   morphine (PF) 4 MG/ML injection 4 mg  4 mg Intravenous Q3H PRN Willy Eddy, MD   4 mg at 08/04/22 1856   multivitamin with minerals tablet 1 tablet  1 tablet  Oral Daily Arnetha Courser, MD   1 tablet at 08/07/22 0944   ondansetron (ZOFRAN) tablet 4 mg  4 mg Oral Q6H PRN Arnetha Courser, MD       Or   ondansetron (ZOFRAN) injection 4 mg  4 mg Intravenous Q6H PRN Arnetha Courser, MD   4 mg at 08/04/22 1857   pantoprazole (PROTONIX) EC tablet 40 mg  40 mg Oral BID Mosetta Pigeon, MD   40 mg at 08/07/22 0944   polyethylene glycol (MIRALAX / GLYCOLAX) packet 17 g  17 g Oral Daily PRN Arnetha Courser, MD       sodium bicarbonate tablet 1,300 mg  1,300 mg Oral BID Arnetha Courser, MD   1,300 mg at 08/07/22 0944     Abtx:  Anti-infectives (From admission, onward)    Start     Dose/Rate Route Frequency Ordered Stop   08/07/22 1000  meropenem (MERREM) 1 g in sodium chloride 0.9 % 100 mL IVPB        1 g 200 mL/hr over 30 Minutes Intravenous Every 12 hours 08/07/22 0911     08/04/22 1000  meropenem (MERREM) 500 mg in sodium chloride 0.9 % 100 mL IVPB  Status:  Discontinued        500 mg 200 mL/hr over 30 Minutes Intravenous Every 12 hours 08/04/22 0808 08/07/22 0911   08/03/22 1100  cefTRIAXone (ROCEPHIN) 2 g in sodium chloride 0.9 % 100 mL IVPB  Status:  Discontinued        2 g 200 mL/hr over 30 Minutes Intravenous Every 24 hours 08/03/22 1046 08/04/22 0806       REVIEW OF SYSTEMS:  Const: negative fever, negative chills, negative weight loss Eyes: negative diplopia or visual changes, negative eye pain ENT: negative coryza, negative sore throat Resp: negative cough, hemoptysis, dyspnea Cards: negative for chest pain, palpitations, lower extremity edema GU: negative for frequency, dysuria and hematuria GI: Negative for abdominal pain, diarrhea, bleeding, constipation Skin: negative for rash and pruritus Heme: negative for easy bruising and gum/nose bleeding VZ:DGLO sacrum pain, hip pain, walks with crutches Neurolo:negative for headaches, dizziness, vertigo, memory problems  Psych:, depression  Endocrine: negative for thyroid,  diabetes Allergy/Immunology- negative for any medication or food allergies ?  Objective:  VITALS:  BP 121/89 (BP Location: Right Arm)   Pulse 84   Temp 98.4 F (36.9 C) (Oral)   Resp 20   Ht 5\' 5"  (1.651 m)   Wt 77.1 kg  SpO2 96%   BMI 28.29 kg/m   PHYSICAL EXAM:  General: Alert, cooperative, no distress, appears stated age.  Head: Normocephalic, without obvious abnormality, atraumatic. Eyes: Conjunctivae clear, anicteric sclerae. Pupils are equal ENT Nares normal. No drainage or sinus tenderness. Lips, mucosa, and tongue normal. No Thrush Neck: Supple, symmetrical, no adenopathy, thyroid: non tender no carotid bruit and no JVD. Back: No CVA tenderness. Lungs: Clear to auscultation bilaterally. No Wheezing or Rhonchi. No rales. Heart: Regular rate and rhythm, no murmur, rub or gallop. Abdomen: Soft, non-tender,not distended. Bowel sounds normal. No masses Extremities: atraumatic, no cyanosis. No edema. No clubbing Skin: No rashes or lesions. Or bruising Lymph: Cervical, supraclavicular normal. Neurologic: Grossly non-focal Pertinent Labs Lab Results CBC    Component Value Date/Time   WBC 16.4 (H) 08/04/2022 0348   RBC 4.28 08/04/2022 0348   HGB 12.6 (L) 08/04/2022 0348   HCT 38.1 (L) 08/04/2022 0348   PLT 228 08/04/2022 0348   MCV 89.0 08/04/2022 0348   MCH 29.4 08/04/2022 0348   MCHC 33.1 08/04/2022 0348   RDW 13.5 08/04/2022 0348   LYMPHSABS 1.1 08/03/2022 0956   MONOABS 1.7 (H) 08/03/2022 0956   EOSABS 0.0 08/03/2022 0956   BASOSABS 0.1 08/03/2022 0956       Latest Ref Rng & Units 08/07/2022    5:15 AM 08/06/2022    3:46 AM 08/05/2022    8:58 AM  CMP  Glucose 70 - 99 mg/dL 161  096  045   BUN 6 - 20 mg/dL 52  55  60   Creatinine 0.61 - 1.24 mg/dL 4.09  8.11  9.14   Sodium 135 - 145 mmol/L 144  139  137   Potassium 3.5 - 5.1 mmol/L 4.3  4.1  3.8   Chloride 98 - 111 mmol/L 112  108  105   CO2 22 - 32 mmol/L 21  20  17    Calcium 8.9 - 10.3 mg/dL 8.0   8.3  8.2       Microbiology: Recent Results (from the past 240 hour(s))  Urine Culture (for pregnant, neutropenic or urologic patients or patients with an indwelling urinary catheter)     Status: Abnormal   Collection Time: 08/03/22 10:09 AM   Specimen: Urine, Clean Catch  Result Value Ref Range Status   Specimen Description   Final    URINE, CLEAN CATCH Performed at [facility], [address] [place], [place] [address]    Special Requests   Final    NONE Performed at [facility], [address] [place], [place] [address]    Culture (A)  Final    >=100,000 COLONIES/mL ESCHERICHIA COLI Confirmed Extended Spectrum Beta-Lactamase Producer (ESBL).  In bloodstream infections from ESBL organisms, carbapenems are preferred over piperacillin/tazobactam. They are shown to have a lower risk of mortality.    Report Status 08/05/2022 FINAL  Final   Organism ID, Bacteria ESCHERICHIA COLI (A)  Final      Susceptibility   Escherichia coli - MIC*    AMPICILLIN >=32 RESISTANT Resistant     CEFAZOLIN >=64 RESISTANT Resistant     CEFEPIME 16 RESISTANT Resistant     CEFTRIAXONE >=64 RESISTANT Resistant     CIPROFLOXACIN >=4 RESISTANT Resistant     GENTAMICIN >=16 RESISTANT Resistant     IMIPENEM <=0.25 SENSITIVE Sensitive     NITROFURANTOIN <=16 SENSITIVE Sensitive     TRIMETH/SULFA <=20 SENSITIVE Sensitive     AMPICILLIN/SULBACTAM >=32 RESISTANT Resistant  PIP/TAZO 16 SENSITIVE Sensitive     * >=100,000 COLONIES/mL ESCHERICHIA COLI  Resp panel by RT-PCR (RSV, Flu A&B, Covid) Anterior Nasal Swab     Status: None   Collection Time: 08/03/22 10:17 AM   Specimen: Anterior Nasal Swab  Result Value Ref Range Status   SARS Coronavirus 2 by RT PCR NEGATIVE NEGATIVE Final    Comment: (NOTE) SARS-CoV-2 target nucleic acids are NOT DETECTED.  The SARS-CoV-2 RNA is generally detectable in upper respiratory specimens during the acute phase of infection. The  lowest concentration of SARS-CoV-2 viral copies this assay can detect is 138 copies/mL. A negative result does not preclude SARS-Cov-2 infection and should not be used as the sole basis for treatment or other patient management decisions. A negative result may occur with  improper specimen collection/handling, submission of specimen other than nasopharyngeal swab, presence of viral mutation(s) within the areas targeted by this assay, and inadequate number of viral copies(<138 copies/mL). A negative result must be combined with clinical observations, patient history, and epidemiological information. The expected result is Negative.  Fact Sheet for Patients:  BloggerCourse.com  Fact Sheet for Healthcare Providers:  SeriousBroker.it  This test is no t yet approved or cleared by the [place] FDA and  has been authorized for detection and/or diagnosis of SARS-CoV-2 by FDA under an Emergency Use Authorization (EUA). This EUA will remain  in effect (meaning this test can be used) for the duration of the COVID-19 declaration under Section 564(b)(1) of the Act, 21 U.S.C.section 360bbb-3(b)(1), unless the authorization is terminated  or revoked sooner.       Influenza A by PCR NEGATIVE NEGATIVE Final   Influenza B by PCR NEGATIVE NEGATIVE Final    Comment: (NOTE) The Xpert Xpress SARS-CoV-2/FLU/RSV plus assay is intended as an aid in the diagnosis of influenza from Nasopharyngeal swab specimens and should not be used as a sole basis for treatment. Nasal washings and aspirates are unacceptable for Xpert Xpress SARS-CoV-2/FLU/RSV testing.  Fact Sheet for Patients: BloggerCourse.com  Fact Sheet for Healthcare Providers: SeriousBroker.it  This test is not yet approved or cleared by the [place] FDA and has been authorized for detection and/or diagnosis of SARS-CoV-2 by FDA under  an Emergency Use Authorization (EUA). This EUA will remain in effect (meaning this test can be used) for the duration of the COVID-19 declaration under Section 564(b)(1) of the Act, 21 U.S.C. section 360bbb-3(b)(1), unless the authorization is terminated or revoked.     Resp Syncytial Virus by PCR NEGATIVE NEGATIVE Final    Comment: (NOTE) Fact Sheet for Patients: BloggerCourse.com  Fact Sheet for Healthcare Providers: SeriousBroker.it  This test is not yet approved or cleared by the [place] FDA and has been authorized for detection and/or diagnosis of SARS-CoV-2 by FDA under an Emergency Use Authorization (EUA). This EUA will remain in effect (meaning this test can be used) for the duration of the COVID-19 declaration under Section 564(b)(1) of the Act, 21 U.S.C. section 360bbb-3(b)(1), unless the authorization is terminated or revoked.  Performed at [facility], [address] [place], [place] [address]   Blood culture (routine x 2)     Status: Abnormal   Collection Time: 08/03/22 10:47 AM   Specimen: BLOOD  Result Value Ref Range Status   Specimen Description   Final    BLOOD RIGHT ANTECUBITAL Performed at [facility], [address], [place], [place] [address]    Special Requests   Final    BOTTLES DRAWN AEROBIC  AND ANAEROBIC Blood Culture adequate volume Performed at [facility], [address] [place], [place] [address]    Culture  Setup Time   Final    IN BOTH AEROBIC AND ANAEROBIC BOTTLES GRAM NEGATIVE RODS CRITICAL VALUE NOTED.  VALUE IS CONSISTENT WITH PREVIOUSLY REPORTED AND CALLED VALUE. Performed at [facility], [address] [place], [place] [address]    Culture (A)  Final    ESCHERICHIA COLI SUSCEPTIBILITIES PERFORMED ON PREVIOUS CULTURE WITHIN THE LAST 5 DAYS. Performed at [facility], [address], [place], [place] [address]    Report Status  08/07/2022 FINAL  Final  Blood culture (routine x 2)     Status: Abnormal   Collection Time: 08/03/22 10:52 AM   Specimen: BLOOD LEFT ARM  Result Value Ref Range Status   Specimen Description   Final    BLOOD LEFT ARM Performed at [facility], [address] [place], [place] [address]    Special Requests   Final    BOTTLES DRAWN AEROBIC AND ANAEROBIC Blood Culture adequate volume Performed at [facility], [address] [place], [place] [address]    Culture  Setup Time   Final    GRAM NEGATIVE RODS AEROBIC BOTTLE ONLY CRITICAL RESULT CALLED TO, READ BACK BY AND VERIFIED WITH: ALEX CHAPPELL ON 08/04/22 AT 0802 QSD    Culture (A)  Final    ESCHERICHIA COLI Confirmed Extended Spectrum Beta-Lactamase Producer (ESBL).  In bloodstream infections from ESBL organisms, carbapenems are preferred over piperacillin/tazobactam. They are shown to have a lower risk of mortality.    Report Status 08/06/2022 FINAL  Final   Organism ID, Bacteria ESCHERICHIA COLI  Final      Susceptibility   Escherichia coli - MIC*    AMPICILLIN >=32 RESISTANT Resistant     CEFEPIME 16 RESISTANT Resistant     CEFTAZIDIME RESISTANT Resistant     CEFTRIAXONE >=64 RESISTANT Resistant     CIPROFLOXACIN >=4 RESISTANT Resistant     GENTAMICIN >=16 RESISTANT Resistant     IMIPENEM <=0.25 SENSITIVE Sensitive     TRIMETH/SULFA <=20 SENSITIVE Sensitive     AMPICILLIN/SULBACTAM >=32 RESISTANT Resistant     PIP/TAZO 16 SENSITIVE Sensitive     * ESCHERICHIA COLI  Blood Culture ID Panel (Reflexed)     Status: Abnormal   Collection Time: 08/03/22 10:52 AM  Result Value Ref Range Status   Enterococcus faecalis NOT DETECTED NOT DETECTED Final   Enterococcus Faecium NOT DETECTED NOT DETECTED Final   Listeria monocytogenes NOT DETECTED NOT DETECTED Final   Staphylococcus species NOT DETECTED NOT DETECTED Final   Staphylococcus aureus (BCID) NOT DETECTED NOT DETECTED Final   Staphylococcus epidermidis  NOT DETECTED NOT DETECTED Final   Staphylococcus lugdunensis NOT DETECTED NOT DETECTED Final   Streptococcus species NOT DETECTED NOT DETECTED Final   Streptococcus agalactiae NOT DETECTED NOT DETECTED Final   Streptococcus pneumoniae NOT DETECTED NOT DETECTED Final   Streptococcus pyogenes NOT DETECTED NOT DETECTED Final   A.calcoaceticus-baumannii NOT DETECTED NOT DETECTED Final   Bacteroides fragilis NOT DETECTED NOT DETECTED Final   Enterobacterales DETECTED (A) NOT DETECTED Final    Comment: Enterobacterales represent a large order of gram negative bacteria, not a single organism. CRITICAL RESULT CALLED TO, READ BACK BY AND VERIFIED WITH: ALEX CHAPPELL ON 08/04/22 AT 0802 QSD    Enterobacter cloacae complex NOT DETECTED NOT DETECTED Final   Escherichia coli DETECTED (A) NOT DETECTED Final    Comment: CRITICAL RESULT  CALLED TO, READ BACK BY AND VERIFIED WITH: ALEX CHAPPELL ON 08/04/22 AT 0802 QSD    Klebsiella aerogenes NOT DETECTED NOT DETECTED Final   Klebsiella oxytoca NOT DETECTED NOT DETECTED Final   Klebsiella pneumoniae NOT DETECTED NOT DETECTED Final   Proteus species NOT DETECTED NOT DETECTED Final   Salmonella species NOT DETECTED NOT DETECTED Final   Serratia marcescens NOT DETECTED NOT DETECTED Final   Haemophilus influenzae NOT DETECTED NOT DETECTED Final   Neisseria meningitidis NOT DETECTED NOT DETECTED Final   Pseudomonas aeruginosa NOT DETECTED NOT DETECTED Final   Stenotrophomonas maltophilia NOT DETECTED NOT DETECTED Final   Candida albicans NOT DETECTED NOT DETECTED Final   Candida auris NOT DETECTED NOT DETECTED Final   Candida glabrata NOT DETECTED NOT DETECTED Final   Candida krusei NOT DETECTED NOT DETECTED Final   Candida parapsilosis NOT DETECTED NOT DETECTED Final   Candida tropicalis NOT DETECTED NOT DETECTED Final   Cryptococcus neoformans/gattii NOT DETECTED NOT DETECTED Final   CTX-M ESBL DETECTED (A) NOT DETECTED Final    Comment: CRITICAL RESULT  CALLED TO, READ BACK BY AND VERIFIED WITH: ALEX CHAPPELL ON 08/04/22 AT 0802 QSD (NOTE) Extended spectrum beta-lactamase detected. Recommend a carbapenem as initial therapy.      Carbapenem resistance IMP NOT DETECTED NOT DETECTED Final   Carbapenem resistance KPC NOT DETECTED NOT DETECTED Final   Carbapenem resistance NDM NOT DETECTED NOT DETECTED Final   Carbapenem resist OXA 48 LIKE NOT DETECTED NOT DETECTED Final   Carbapenem resistance VIM NOT DETECTED NOT DETECTED Final    Comment: Performed at [facility], [address] [place], [place] [address]  Gastrointestinal Panel by PCR , Stool     Status: None   Collection Time: 08/03/22  4:30 PM   Specimen: STOOL  Result Value Ref Range Status   Campylobacter species NOT DETECTED NOT DETECTED Final   Plesimonas shigelloides NOT DETECTED NOT DETECTED Final   Salmonella species NOT DETECTED NOT DETECTED Final   Yersinia enterocolitica NOT DETECTED NOT DETECTED Final   Vibrio species NOT DETECTED NOT DETECTED Final   Vibrio cholerae NOT DETECTED NOT DETECTED Final   Enteroaggregative E coli (EAEC) NOT DETECTED NOT DETECTED Final   Enteropathogenic E coli (EPEC) NOT DETECTED NOT DETECTED Final   Enterotoxigenic E coli (ETEC) NOT DETECTED NOT DETECTED Final   Shiga like toxin producing E coli (STEC) NOT DETECTED NOT DETECTED Final   Shigella/Enteroinvasive E coli (EIEC) NOT DETECTED NOT DETECTED Final   Cryptosporidium NOT DETECTED NOT DETECTED Final   Cyclospora cayetanensis NOT DETECTED NOT DETECTED Final   Entamoeba histolytica NOT DETECTED NOT DETECTED Final   Giardia lamblia NOT DETECTED NOT DETECTED Final   Adenovirus F40/41 NOT DETECTED NOT DETECTED Final   Astrovirus NOT DETECTED NOT DETECTED Final   Norovirus GI/GII NOT DETECTED NOT DETECTED Final   Rotavirus A NOT DETECTED NOT DETECTED Final   Sapovirus (I, II, IV, and V) NOT DETECTED NOT DETECTED Final    Comment: Performed at [facility], [address] [place], [place] [address]  C Difficile Quick Screen w PCR reflex     Status: None   Collection Time: 08/03/22  4:30 PM   Specimen: STOOL  Result Value Ref Range Status   C Diff antigen NEGATIVE NEGATIVE Final   C Diff toxin NEGATIVE NEGATIVE Final   C Diff interpretation No C. difficile detected.  Final    Comment: Performed at [facility], [address] [place], [place] [address]  IMAGING RESULTS: Ct abdomen  B/l enlarged kidney and perinephric standing I have personally reviewed the films ? Impression/Recommendation ?ESBL ecoli bacteremia and UTI- on Meropenem Unclear why or how he got a UTI /bacteremia   Diarrhea- cdiff and GI panel neg  Polyarthritis seronegative  H/o Gout   AKI unclear reason- he as been taking some meds from [place] ? ? ___________________________________________________ Discussed with patient, in detail Note:  This document was prepared using Dragon voice recognition software and may include unintentional dictation errors.

## 2022-08-07 NOTE — Progress Notes (Signed)
[facility]  PROGRESS NOTE   Subjective:   Patient laying in bed Tolerated small amount of breakfast Complaining of left shoulder tightness and right sciatica pain  Creatinine 3.9 Urine output 3400 cc in past 24 hours  Objective:  Vital signs: Blood pressure (!) 121/91, pulse 82, temperature 97.8 F (36.6 C), temperature source Oral, resp. rate 16, height 5\' 5"  (1.651 m), weight 77.1 kg, SpO2 98%.  Intake/Output Summary (Last 24 hours) at 08/07/2022 1127 Last data filed at 08/07/2022 0746 Gross per 24 hour  Intake 840 ml  Output 3025 ml  Net -2185 ml   Filed Weights   08/03/22 0949  Weight: 77.1 kg     Physical Exam: General:  grimacing  Head:  Normocephalic, atraumatic. Moist oral mucosal membranes  Eyes:  Anicteric  Lungs:   Clear to auscultation, normal effort  Heart:  S1S2 no rubs  Abdomen:   Soft, nontender, bowel sounds present  Extremities:  No peripheral edema.  Neurologic:  Awake, alert, following commands  Skin:  No lesions  Access: None    Basic Metabolic Panel: Recent Labs  Lab 08/03/22 0955 08/03/22 0956 08/04/22 0348 08/04/22 1606 08/05/22 0858 08/06/22 0346 08/07/22 0515  NA  --    < > 135 133* 137 139 144  K  --    < > 3.5 3.9 3.8 4.1 4.3  CL  --    < > 103 106 105 108 112*  CO2  --    < > 19* 19* 17* 20* 21*  GLUCOSE  --    < > 134* 120* 110* 115* 126*  BUN  --    < > 54* 55* 60* 55* 52*  CREATININE 5.07*   < > 5.75* 6.28* 6.29* 5.73* 3.90*  CALCIUM  --    < > 8.0* 7.5* 8.2* 8.3* 8.0*  MG 1.9  --   --   --   --   --   --   PHOS  --   --   --  2.2* 2.7 3.3 4.0   < > = values in this interval not displayed.   GFR: Estimated Creatinine Clearance: 24.8 mL/min (A) (by C-G formula based on SCr of 3.9 mg/dL (H)).  Liver Function Tests: Recent Labs  Lab 08/03/22 0956 08/04/22 1606 08/05/22 0858 08/06/22 0346 08/07/22 0515  AST 22  --   --   --   --   ALT 28  --   --   --   --   ALKPHOS 76  --   --   --   --   BILITOT  0.8  --   --   --   --   PROT 8.6*  --   --   --   --   ALBUMIN 3.3* 2.5* 2.5* 2.4* 2.3*   No results for input(s): "LIPASE", "AMYLASE" in the last 168 hours. No results for input(s): "AMMONIA" in the last 168 hours.  CBC: Recent Labs  Lab 08/03/22 0956 08/04/22 0348  WBC 20.4* 16.4*  NEUTROABS 17.5*  --   HGB 13.5 12.6*  HCT 38.4* 38.1*  MCV 85.9 89.0  PLT 224 228     HbA1C: Hgb A1c MFr Bld  Date/Time Value Ref Range Status  08/03/2022 09:55 AM 6.1 (H) 4.8 - 5.6 % Final    Comment:    (NOTE)         Prediabetes: 5.7 - 6.4         Diabetes: >6.4  Glycemic control for adults with diabetes: <7.0     Urinalysis: No results for input(s): "COLORURINE", "LABSPEC", "PHURINE", "GLUCOSEU", "HGBUR", "BILIRUBINUR", "KETONESUR", "PROTEINUR", "UROBILINOGEN", "NITRITE", "LEUKOCYTESUR" in the last 72 hours.  Invalid input(s): "APPERANCEUR"     Imaging: ECHOCARDIOGRAM COMPLETE  Result Date: 08/06/2022    ECHOCARDIOGRAM REPORT   Patient Name:   Mason Russell Date of Exam: 08/06/2022 Medical Rec #:  [id number]       Height:       65.0 in Accession #:    [id number]      Weight:       170.0 lb Date of Birth:  April 18, 1985       BSA:          1.846 m Patient Age:    36 years        BP:           128/94 mmHg Patient Gender: M               HR:           98 bpm. Exam Location:  [facility] Procedure: 2D Echo, Color Doppler and Cardiac Doppler Indications:     bacteremia R78.81  History:         Patient has no prior history of Echocardiogram examinations.  Sonographer:     Cristela Blue Referring Phys:  [id number] [facility] AMIN Diagnosing Phys: Lorine Bears MD IMPRESSIONS  1. Left ventricular ejection fraction, by estimation, is 55 to 60%. The left ventricle has normal function. The left ventricle has no regional wall motion abnormalities. Left ventricular diastolic parameters were normal.  2. Right ventricular systolic function is normal. The right ventricular size is normal. Tricuspid regurgitation  signal is inadequate for assessing PA pressure.  3. The mitral valve is normal in structure. No evidence of mitral valve regurgitation. No evidence of mitral stenosis.  4. The aortic valve is normal in structure. Aortic valve regurgitation is not visualized. No aortic stenosis is present. Conclusion(s)/Recommendation(s): No evidence of valvular vegetations on this transthoracic echocardiogram. FINDINGS  Left Ventricle: Left ventricular ejection fraction, by estimation, is 55 to 60%. The left ventricle has normal function. The left ventricle has no regional wall motion abnormalities. The left ventricular internal cavity size was normal in size. There is  no left ventricular hypertrophy. Left ventricular diastolic parameters were normal. Right Ventricle: The right ventricular size is normal. No increase in right ventricular wall thickness. Right ventricular systolic function is normal. Tricuspid regurgitation signal is inadequate for assessing PA pressure. Left Atrium: Left atrial size was normal in size. Right Atrium: Right atrial size was normal in size. Pericardium: There is no evidence of pericardial effusion. Mitral Valve: The mitral valve is normal in structure. No evidence of mitral valve regurgitation. No evidence of mitral valve stenosis. Tricuspid Valve: The tricuspid valve is normal in structure. Tricuspid valve regurgitation is trivial. No evidence of tricuspid stenosis. Aortic Valve: The aortic valve is normal in structure. Aortic valve regurgitation is not visualized. No aortic stenosis is present. Aortic valve mean gradient measures 2.0 mmHg. Aortic valve peak gradient measures 3.4 mmHg. Aortic valve area, by VTI measures 2.19 cm. Pulmonic Valve: The pulmonic valve was normal in structure. Pulmonic valve regurgitation is not visualized. No evidence of pulmonic stenosis. Aorta: The aortic root is normal in size and structure. Venous: The inferior vena cava was not well visualized. IAS/Shunts: No atrial  level shunt detected by color flow Doppler.  LEFT VENTRICLE PLAX 2D LVIDd:  4.70 cm   Diastology LVIDs:         3.40 cm   LV e' medial:    10.20 cm/s LV PW:         1.50 cm   LV E/e' medial:  6.3 LV IVS:        1.00 cm   LV e' lateral:   12.70 cm/s LVOT diam:     2.10 cm   LV E/e' lateral: 5.0 LV SV:         39 LV SV Index:   21 LVOT Area:     3.46 cm  RIGHT VENTRICLE RV Basal diam:  3.90 cm RV Mid diam:    3.10 cm RV S prime:     16.00 cm/s LEFT ATRIUM             Index        RIGHT ATRIUM           Index LA diam:        4.30 cm 2.33 cm/m   RA Area:     13.30 cm LA Vol (A2C):   47.3 ml 25.62 ml/m  RA Volume:   29.10 ml  15.76 ml/m LA Vol (A4C):   29.9 ml 16.20 ml/m LA Biplane Vol: 40.3 ml 21.83 ml/m  AORTIC VALVE AV Area (Vmax):    2.52 cm AV Area (Vmean):   2.31 cm AV Area (VTI):     2.19 cm AV Vmax:           92.40 cm/s AV Vmean:          69.250 cm/s AV VTI:            0.179 m AV Peak Grad:      3.4 mmHg AV Mean Grad:      2.0 mmHg LVOT Vmax:         67.10 cm/s LVOT Vmean:        46.200 cm/s LVOT VTI:          0.113 m LVOT/AV VTI ratio: 0.63  AORTA Ao Root diam: 3.00 cm MITRAL VALVE               TRICUSPID VALVE MV Area (PHT): 3.72 cm    TR Peak grad:   23.4 mmHg MV Decel Time: 204 msec    TR Vmax:        242.00 cm/s MV E velocity: 63.90 cm/s MV A velocity: 60.30 cm/s  SHUNTS MV E/A ratio:  1.06        Systemic VTI:  0.11 m                            Systemic Diam: 2.10 cm Lorine Bears MD Electronically signed by Lorine Bears MD Signature Date/Time: 08/06/2022/12:28:39 PM    Final    US Abdomen Limited RUQ (LIVER/GB)  Result Date: 08/05/2022 CLINICAL DATA:  16109 Abdominal distention [id number] EXAM: ULTRASOUND ABDOMEN LIMITED RIGHT UPPER QUADRANT COMPARISON:  August 03, 2022 FINDINGS: Gallbladder: Layering sludge. No wall thickening. No sonographic Murphy sign noted by sonographer. Common bile duct: Diameter: Visualized portion measures 9 mm, dilated. This is increased since recent CT. Liver:  No focal lesion identified. Diffusely increased in parenchymal echogenicity. Portal vein is patent on color Doppler imaging with normal direction of blood flow towards the liver. Other: None. IMPRESSION: 1. The common bile duct is dilated measuring up to 9 mm. Recommend correlation with LFTs. If concern for biliary  obstruction, recommend dedicated hepatic MRI with MRCP. 2. Hepatic steatosis. 3. Biliary sludge. Electronically Signed   By: Meda Klinefelter M.D.   On: 08/05/2022 14:59     Medications:    meropenem (MERREM) IV 1 g (08/07/22 0951)    heparin  5,000 Units Subcutaneous Q8H   multivitamin with minerals  1 tablet Oral Daily   pantoprazole  40 mg Oral BID   sodium bicarbonate  1,300 mg Oral BID    Assessment/ Plan:     37 y.o. male with medical problems of gout, inflammatory arthritis, history of bilateral necrosis of femoral heads-MRI September 2023 was admitted on 08/03/2022 for lower abdominal pain, decreased urine output, generalized malaise, headache and nausea.   #1: Acute kidney injury with proteinuria on possible chronic kidney disease: Patient may have acute kidney injury secondary to long history of diarrhea complicated by the use of nonsteroidal anti-inflammatory drugs.   Serologies workup negative  Renal function continues to improve   Expect improvements as underlying cause is treated.  Will continue to follow patient during this admission and at discharge  No acute need for dialysis at this time  #2: Pyelonephritis: Blood culture positive for E Coli bacteremia. Continue treatments with meropenem.   #3: Metabolic acidosis: Metabolic acidosis can be secondary to acute kidney injury complicated by diarrhea.    Responding to oral supplementation   #4: Diarrhea: Diarrhea improving.  C. difficile  negative.      LOS: 4 [facility] 7/30/202411:27 AM

## 2022-08-08 DIAGNOSIS — B9620 Unspecified Escherichia coli [E. coli] as the cause of diseases classified elsewhere: Secondary | ICD-10-CM

## 2022-08-08 DIAGNOSIS — A498 Other bacterial infections of unspecified site: Secondary | ICD-10-CM

## 2022-08-08 DIAGNOSIS — R7881 Bacteremia: Secondary | ICD-10-CM

## 2022-08-08 LAB — GLUCOSE, CAPILLARY: Glucose-Capillary: 159 mg/dL — ABNORMAL HIGH (ref 70–99)

## 2022-08-08 NOTE — Progress Notes (Signed)
Date of Admission:  08/03/2022      ID: Mason Russell is a 37 y.o. male Principal Problem:   Pyelonephritis Active Problems:   Hypokalemia   History of inflammatory arthritis   AKI (acute kidney injury) (HCC)   Diarrhea   Bilateral femoral head necrosis (HCC)   Metabolic acidosis   Back pain    Subjective: Feeling better His mom brought the meds he was taking from [place] Prednisone 5 mg TID Allopurinol 300mg  one a day acetaminophen  Medications:   heparin  5,000 Units Subcutaneous Q8H   multivitamin with minerals  1 tablet Oral Daily   pantoprazole  40 mg Oral BID    Objective: Vital signs in last 24 hours: Patient Vitals for the past 24 hrs:  BP Temp Temp src Pulse Resp SpO2  08/08/22 0727 120/85 98.4 F (36.9 C) Oral 95 17 95 %  08/07/22 2017 (!) 140/91 98.5 F (36.9 C) Oral 97 18 99 %  08/07/22 1735 126/87 98.5 F (36.9 C) Oral 97 12 99 %       PHYSICAL EXAM:  General: Alert, cooperative, no distress, appears stated age.  Head: Normocephalic, without obvious abnormality, atraumatic. Eyes: Conjunctivae clear, anicteric sclerae. Pupils are equal ENT Nares normal. No drainage or sinus tenderness. Lips, mucosa, and tongue normal. No Thrush Neck: Supple, symmetrical, no adenopathy, thyroid: non tender no carotid bruit and no JVD. Back: No CVA tenderness. Lungs: Clear to auscultation bilaterally. No Wheezing or Rhonchi. No rales. Heart: Regular rate and rhythm, no murmur, rub or gallop. Abdomen: Soft, non-tender,not distended. Bowel sounds normal. No masses Extremities: atraumatic, no cyanosis. No edema. No clubbing Skin: No rashes or lesions. Or bruising Lymph: Cervical, supraclavicular normal. Neurologic: Grossly non-focal  Lab Results    Latest Ref Rng & Units 08/08/2022    5:15 AM 08/04/2022    3:48 AM 08/03/2022    9:56 AM  CBC  WBC 4.0 - 10.5 K/uL 10.9  16.4  20.4   Hemoglobin 13.0 - 17.0 g/dL 16.1  09.6  04.5   Hematocrit 39.0 - 52.0 % 35.3   38.1  38.4   Platelets 150 - 400 K/uL 679  228  224        Latest Ref Rng & Units 08/08/2022    5:15 AM 08/07/2022    5:15 AM 08/06/2022    3:46 AM  CMP  Glucose 70 - 99 mg/dL 409  811  914   BUN 6 - 20 mg/dL 39  52  55   Creatinine 0.61 - 1.24 mg/dL 7.82  9.56  2.13   Sodium 135 - 145 mmol/L 143  144  139   Potassium 3.5 - 5.1 mmol/L 4.3  4.3  4.1   Chloride 98 - 111 mmol/L 111  112  108   CO2 22 - 32 mmol/L 25  21  20    Calcium 8.9 - 10.3 mg/dL 8.1  8.0  8.3       Microbiology: BC ESBL ecoli bacteremia Studies/Results: MR LUMBAR SPINE WO CONTRAST  Result Date: 08/07/2022 CLINICAL DATA:  Initial evaluation for mid and lower back pain, history of bacteremia, concern for infection. EXAM: MRI THORACIC AND LUMBAR SPINE WITHOUT CONTRAST TECHNIQUE: Multiplanar and multiecho pulse sequences of the thoracic and lumbar spine were obtained without intravenous contrast. COMPARISON:  Prior study from 09/28/2021. FINDINGS: MRI THORACIC SPINE FINDINGS Alignment: Mild straightening of the normal mid and lower thoracic kyphosis. No listhesis. Vertebrae: Vertebral body height maintained without acute or chronic fracture. Bone  marrow signal intensity within normal limits. No worrisome osseous lesions. No imaging findings to suggest osteomyelitis discitis or septic arthritis. Cord:  Normal signal and morphology.  No epidural collections. Paraspinal and other soft tissues: Paraspinous soft tissues demonstrate no acute inflammatory changes. No soft tissue collections. Trace layering bilateral pleural effusions noted. Disc levels: No significant disc pathology seen within the thoracic spine for age. No focal disc herniation. No stenosis or impingement. MRI LUMBAR SPINE FINDINGS Segmentation:  Examination degraded by motion artifact. Standard segmentation. Lowest well-formed disc space labeled the L5-S1 level. Alignment: Straightening of the normal lumbar lordosis. No significant listhesis. Vertebrae: Vertebral  body height maintained without acute or chronic fracture. Bone marrow signal intensity within normal limits. No worrisome osseous lesions. No evidence for osteomyelitis discitis or septic arthritis. Conus medullaris and cauda equina: Conus extends to the T12 level. Conus and cauda equina appear normal. No epidural collections. Paraspinal and other soft tissues: Paraspinous soft tissues demonstrate no acute finding. Disc levels: L1-2:  Unremarkable. L2-3:  Unremarkable. L3-4: Normal interspace. Mild bilateral facet hypertrophy. No stenosis. L4-5: Disc desiccation with mild circumferential disc bulge. Superimposed small right foraminal disc protrusion closely approximates the exiting right L4 nerve root. Mild facet hypertrophy. No spinal stenosis. Mild right L4 foraminal narrowing. Left neural foramen remains patent. L5-S1: Disc desiccation with diffuse disc bulge. Superimposed small central disc protrusion closely approximates the descending S1 nerve roots without frank neural impingement or displacement. Mild facet hypertrophy. No canal or lateral recess stenosis. Mild right L5 foraminal narrowing. Left neural foramina remains patent. IMPRESSION: MRI THORACIC SPINE IMPRESSION: 1. No evidence for acute infection within the thoracic spine. 2. No significant disc pathology, stenosis, or evidence for neural impingement. MRI LUMBAR SPINE IMPRESSION: 1. No evidence for acute infection within the lumbar spine. 2. Small right foraminal disc protrusion at L4-5, potentially affecting the exiting right L4 nerve root. 3. Small central disc protrusion at L5-S1, closely approximating the descending S1 nerve roots without frank neural impingement. Electronically Signed   By: Rise Mu M.D.   On: 08/07/2022 18:43   MR THORACIC SPINE WO CONTRAST  Result Date: 08/07/2022 CLINICAL DATA:  Initial evaluation for mid and lower back pain, history of bacteremia, concern for infection. EXAM: MRI THORACIC AND LUMBAR SPINE  WITHOUT CONTRAST TECHNIQUE: Multiplanar and multiecho pulse sequences of the thoracic and lumbar spine were obtained without intravenous contrast. COMPARISON:  Prior study from 09/28/2021. FINDINGS: MRI THORACIC SPINE FINDINGS Alignment: Mild straightening of the normal mid and lower thoracic kyphosis. No listhesis. Vertebrae: Vertebral body height maintained without acute or chronic fracture. Bone marrow signal intensity within normal limits. No worrisome osseous lesions. No imaging findings to suggest osteomyelitis discitis or septic arthritis. Cord:  Normal signal and morphology.  No epidural collections. Paraspinal and other soft tissues: Paraspinous soft tissues demonstrate no acute inflammatory changes. No soft tissue collections. Trace layering bilateral pleural effusions noted. Disc levels: No significant disc pathology seen within the thoracic spine for age. No focal disc herniation. No stenosis or impingement. MRI LUMBAR SPINE FINDINGS Segmentation:  Examination degraded by motion artifact. Standard segmentation. Lowest well-formed disc space labeled the L5-S1 level. Alignment: Straightening of the normal lumbar lordosis. No significant listhesis. Vertebrae: Vertebral body height maintained without acute or chronic fracture. Bone marrow signal intensity within normal limits. No worrisome osseous lesions. No evidence for osteomyelitis discitis or septic arthritis. Conus medullaris and cauda equina: Conus extends to the T12 level. Conus and cauda equina appear normal. No epidural collections. Paraspinal and other  soft tissues: Paraspinous soft tissues demonstrate no acute finding. Disc levels: L1-2:  Unremarkable. L2-3:  Unremarkable. L3-4: Normal interspace. Mild bilateral facet hypertrophy. No stenosis. L4-5: Disc desiccation with mild circumferential disc bulge. Superimposed small right foraminal disc protrusion closely approximates the exiting right L4 nerve root. Mild facet hypertrophy. No spinal  stenosis. Mild right L4 foraminal narrowing. Left neural foramen remains patent. L5-S1: Disc desiccation with diffuse disc bulge. Superimposed small central disc protrusion closely approximates the descending S1 nerve roots without frank neural impingement or displacement. Mild facet hypertrophy. No canal or lateral recess stenosis. Mild right L5 foraminal narrowing. Left neural foramina remains patent. IMPRESSION: MRI THORACIC SPINE IMPRESSION: 1. No evidence for acute infection within the thoracic spine. 2. No significant disc pathology, stenosis, or evidence for neural impingement. MRI LUMBAR SPINE IMPRESSION: 1. No evidence for acute infection within the lumbar spine. 2. Small right foraminal disc protrusion at L4-5, potentially affecting the exiting right L4 nerve root. 3. Small central disc protrusion at L5-S1, closely approximating the descending S1 nerve roots without frank neural impingement. Electronically Signed   By: Rise Mu M.D.   On: 08/07/2022 18:43     Assessment/Plan: ?ESBL ecoli bacteremia and UTI- on Meropenem Will need to give for 10-14 days He has no insurance,  Can get IV antibiotic at day surgery on discharge unil 08/17/22 - total 14 days   Diarrhea- cdiff and GI panel neg- much improved   Polyarthritis seronegative - pt has been taking predniosne 5 mg x 3 every day- he got it [place] [facility] G[facility]- on allopurinol     AKI likely CKD ?  Discussed the management with the patient

## 2022-08-08 NOTE — Progress Notes (Signed)
[facility]  PROGRESS NOTE   Subjective:   Patient laying in bed Complaining of mild right sciatica pain.  Diarrhea decreased  Creatinine 2.64 Urine output 2300 ml recorded yesterday  Objective:  Vital signs: Blood pressure 120/85, pulse 95, temperature 98.4 F (36.9 C), temperature source Oral, resp. rate 17, height 5\' 5"  (1.651 m), weight 77.1 kg, SpO2 95%.  Intake/Output Summary (Last 24 hours) at 08/08/2022 1151 Last data filed at 08/08/2022 1001 Gross per 24 hour  Intake 1360 ml  Output 2625 ml  Net -1265 ml   Filed Weights   08/03/22 0949  Weight: 77.1 kg     Physical Exam: General:  NAD  Head:  Normocephalic, atraumatic. Moist oral mucosal membranes  Eyes:  Anicteric  Lungs:   Clear to auscultation, normal effort  Heart:  S1S2 no rubs  Abdomen:   Soft, nontender, bowel sounds present  Extremities:  No peripheral edema.  Neurologic:  Awake, alert, following commands  Skin:  No lesions  Access: None    Basic Metabolic Panel: Recent Labs  Lab 08/03/22 0955 08/03/22 0956 08/04/22 1606 08/05/22 0858 08/06/22 0346 08/07/22 0515 08/08/22 0515  NA  --    < > 133* 137 139 144 143  K  --    < > 3.9 3.8 4.1 4.3 4.3  CL  --    < > 106 105 108 112* 111  CO2  --    < > 19* 17* 20* 21* 25  GLUCOSE  --    < > 120* 110* 115* 126* 125*  BUN  --    < > 55* 60* 55* 52* 39*  CREATININE 5.07*   < > 6.28* 6.29* 5.73* 3.90* 2.64*  CALCIUM  --    < > 7.5* 8.2* 8.3* 8.0* 8.1*  MG 1.9  --   --   --   --   --   --   PHOS  --   --  2.2* 2.7 3.3 4.0 4.0   < > = values in this interval not displayed.   GFR: Estimated Creatinine Clearance: 36.7 mL/min (A) (by C-G formula based on SCr of 2.64 mg/dL (H)).  Liver Function Tests: Recent Labs  Lab 08/03/22 0956 08/04/22 1606 08/05/22 0858 08/06/22 0346 08/07/22 0515 08/08/22 0515  AST 22  --   --   --   --   --   ALT 28  --   --   --   --   --   ALKPHOS 76  --   --   --   --   --   BILITOT 0.8  --   --    --   --   --   PROT 8.6*  --   --   --   --   --   ALBUMIN 3.3* 2.5* 2.5* 2.4* 2.3* 2.5*   No results for input(s): "LIPASE", "AMYLASE" in the last 168 hours. No results for input(s): "AMMONIA" in the last 168 hours.  CBC: Recent Labs  Lab 08/03/22 0956 08/04/22 0348 08/08/22 0515  WBC 20.4* 16.4* 10.9*  NEUTROABS 17.5*  --   --   HGB 13.5 12.6* 11.4*  HCT 38.4* 38.1* 35.3*  MCV 85.9 89.0 91.0  PLT 224 228 679*     HbA1C: Hgb A1c MFr Bld  Date/Time Value Ref Range Status  08/03/2022 09:55 AM 6.1 (H) 4.8 - 5.6 % Final    Comment:    (NOTE)  Prediabetes: 5.7 - 6.4         Diabetes: >6.4         Glycemic control for adults with diabetes: <7.0     Urinalysis: No results for input(s): "COLORURINE", "LABSPEC", "PHURINE", "GLUCOSEU", "HGBUR", "BILIRUBINUR", "KETONESUR", "PROTEINUR", "UROBILINOGEN", "NITRITE", "LEUKOCYTESUR" in the last 72 hours.  Invalid input(s): "APPERANCEUR"     Imaging: MR LUMBAR SPINE WO CONTRAST  Result Date: 08/07/2022 CLINICAL DATA:  Initial evaluation for mid and lower back pain, history of bacteremia, concern for infection. EXAM: MRI THORACIC AND LUMBAR SPINE WITHOUT CONTRAST TECHNIQUE: Multiplanar and multiecho pulse sequences of the thoracic and lumbar spine were obtained without intravenous contrast. COMPARISON:  Prior study from 09/28/2021. FINDINGS: MRI THORACIC SPINE FINDINGS Alignment: Mild straightening of the normal mid and lower thoracic kyphosis. No listhesis. Vertebrae: Vertebral body height maintained without acute or chronic fracture. Bone marrow signal intensity within normal limits. No worrisome osseous lesions. No imaging findings to suggest osteomyelitis discitis or septic arthritis. Cord:  Normal signal and morphology.  No epidural collections. Paraspinal and other soft tissues: Paraspinous soft tissues demonstrate no acute inflammatory changes. No soft tissue collections. Trace layering bilateral pleural effusions noted. Disc  levels: No significant disc pathology seen within the thoracic spine for age. No focal disc herniation. No stenosis or impingement. MRI LUMBAR SPINE FINDINGS Segmentation:  Examination degraded by motion artifact. Standard segmentation. Lowest well-formed disc space labeled the L5-S1 level. Alignment: Straightening of the normal lumbar lordosis. No significant listhesis. Vertebrae: Vertebral body height maintained without acute or chronic fracture. Bone marrow signal intensity within normal limits. No worrisome osseous lesions. No evidence for osteomyelitis discitis or septic arthritis. Conus medullaris and cauda equina: Conus extends to the T12 level. Conus and cauda equina appear normal. No epidural collections. Paraspinal and other soft tissues: Paraspinous soft tissues demonstrate no acute finding. Disc levels: L1-2:  Unremarkable. L2-3:  Unremarkable. L3-4: Normal interspace. Mild bilateral facet hypertrophy. No stenosis. L4-5: Disc desiccation with mild circumferential disc bulge. Superimposed small right foraminal disc protrusion closely approximates the exiting right L4 nerve root. Mild facet hypertrophy. No spinal stenosis. Mild right L4 foraminal narrowing. Left neural foramen remains patent. L5-S1: Disc desiccation with diffuse disc bulge. Superimposed small central disc protrusion closely approximates the descending S1 nerve roots without frank neural impingement or displacement. Mild facet hypertrophy. No canal or lateral recess stenosis. Mild right L5 foraminal narrowing. Left neural foramina remains patent. IMPRESSION: MRI THORACIC SPINE IMPRESSION: 1. No evidence for acute infection within the thoracic spine. 2. No significant disc pathology, stenosis, or evidence for neural impingement. MRI LUMBAR SPINE IMPRESSION: 1. No evidence for acute infection within the lumbar spine. 2. Small right foraminal disc protrusion at L4-5, potentially affecting the exiting right L4 nerve root. 3. Small central disc  protrusion at L5-S1, closely approximating the descending S1 nerve roots without frank neural impingement. Electronically Signed   By: Rise Mu M.D.   On: 08/07/2022 18:43   MR THORACIC SPINE WO CONTRAST  Result Date: 08/07/2022 CLINICAL DATA:  Initial evaluation for mid and lower back pain, history of bacteremia, concern for infection. EXAM: MRI THORACIC AND LUMBAR SPINE WITHOUT CONTRAST TECHNIQUE: Multiplanar and multiecho pulse sequences of the thoracic and lumbar spine were obtained without intravenous contrast. COMPARISON:  Prior study from 09/28/2021. FINDINGS: MRI THORACIC SPINE FINDINGS Alignment: Mild straightening of the normal mid and lower thoracic kyphosis. No listhesis. Vertebrae: Vertebral body height maintained without acute or chronic fracture. Bone marrow signal intensity within normal limits. No worrisome  osseous lesions. No imaging findings to suggest osteomyelitis discitis or septic arthritis. Cord:  Normal signal and morphology.  No epidural collections. Paraspinal and other soft tissues: Paraspinous soft tissues demonstrate no acute inflammatory changes. No soft tissue collections. Trace layering bilateral pleural effusions noted. Disc levels: No significant disc pathology seen within the thoracic spine for age. No focal disc herniation. No stenosis or impingement. MRI LUMBAR SPINE FINDINGS Segmentation:  Examination degraded by motion artifact. Standard segmentation. Lowest well-formed disc space labeled the L5-S1 level. Alignment: Straightening of the normal lumbar lordosis. No significant listhesis. Vertebrae: Vertebral body height maintained without acute or chronic fracture. Bone marrow signal intensity within normal limits. No worrisome osseous lesions. No evidence for osteomyelitis discitis or septic arthritis. Conus medullaris and cauda equina: Conus extends to the T12 level. Conus and cauda equina appear normal. No epidural collections. Paraspinal and other soft  tissues: Paraspinous soft tissues demonstrate no acute finding. Disc levels: L1-2:  Unremarkable. L2-3:  Unremarkable. L3-4: Normal interspace. Mild bilateral facet hypertrophy. No stenosis. L4-5: Disc desiccation with mild circumferential disc bulge. Superimposed small right foraminal disc protrusion closely approximates the exiting right L4 nerve root. Mild facet hypertrophy. No spinal stenosis. Mild right L4 foraminal narrowing. Left neural foramen remains patent. L5-S1: Disc desiccation with diffuse disc bulge. Superimposed small central disc protrusion closely approximates the descending S1 nerve roots without frank neural impingement or displacement. Mild facet hypertrophy. No canal or lateral recess stenosis. Mild right L5 foraminal narrowing. Left neural foramina remains patent. IMPRESSION: MRI THORACIC SPINE IMPRESSION: 1. No evidence for acute infection within the thoracic spine. 2. No significant disc pathology, stenosis, or evidence for neural impingement. MRI LUMBAR SPINE IMPRESSION: 1. No evidence for acute infection within the lumbar spine. 2. Small right foraminal disc protrusion at L4-5, potentially affecting the exiting right L4 nerve root. 3. Small central disc protrusion at L5-S1, closely approximating the descending S1 nerve roots without frank neural impingement. Electronically Signed   By: Rise Mu M.D.   On: 08/07/2022 18:43   ECHOCARDIOGRAM COMPLETE  Result Date: 08/06/2022    ECHOCARDIOGRAM REPORT   Patient Name:   Mason Russell Date of Exam: 08/06/2022 Medical Rec #:  [id number]       Height:       65.0 in Accession #:    [id number]      Weight:       170.0 lb Date of Birth:  12/07/1985       BSA:          1.846 m Patient Age:    36 years        BP:           128/94 mmHg Patient Gender: M               HR:           98 bpm. Exam Location:  [facility] Procedure: 2D Echo, Color Doppler and Cardiac Doppler Indications:     bacteremia R78.81  History:         Patient has no prior  history of Echocardiogram examinations.  Sonographer:     Cristela Blue Referring Phys:  [id number] [facility] AMIN Diagnosing Phys: Lorine Bears MD IMPRESSIONS  1. Left ventricular ejection fraction, by estimation, is 55 to 60%. The left ventricle has normal function. The left ventricle has no regional wall motion abnormalities. Left ventricular diastolic parameters were normal.  2. Right ventricular systolic function is normal. The right ventricular size is normal. Tricuspid regurgitation signal is  inadequate for assessing PA pressure.  3. The mitral valve is normal in structure. No evidence of mitral valve regurgitation. No evidence of mitral stenosis.  4. The aortic valve is normal in structure. Aortic valve regurgitation is not visualized. No aortic stenosis is present. Conclusion(s)/Recommendation(s): No evidence of valvular vegetations on this transthoracic echocardiogram. FINDINGS  Left Ventricle: Left ventricular ejection fraction, by estimation, is 55 to 60%. The left ventricle has normal function. The left ventricle has no regional wall motion abnormalities. The left ventricular internal cavity size was normal in size. There is  no left ventricular hypertrophy. Left ventricular diastolic parameters were normal. Right Ventricle: The right ventricular size is normal. No increase in right ventricular wall thickness. Right ventricular systolic function is normal. Tricuspid regurgitation signal is inadequate for assessing PA pressure. Left Atrium: Left atrial size was normal in size. Right Atrium: Right atrial size was normal in size. Pericardium: There is no evidence of pericardial effusion. Mitral Valve: The mitral valve is normal in structure. No evidence of mitral valve regurgitation. No evidence of mitral valve stenosis. Tricuspid Valve: The tricuspid valve is normal in structure. Tricuspid valve regurgitation is trivial. No evidence of tricuspid stenosis. Aortic Valve: The aortic valve is normal in structure.  Aortic valve regurgitation is not visualized. No aortic stenosis is present. Aortic valve mean gradient measures 2.0 mmHg. Aortic valve peak gradient measures 3.4 mmHg. Aortic valve area, by VTI measures 2.19 cm. Pulmonic Valve: The pulmonic valve was normal in structure. Pulmonic valve regurgitation is not visualized. No evidence of pulmonic stenosis. Aorta: The aortic root is normal in size and structure. Venous: The inferior vena cava was not well visualized. IAS/Shunts: No atrial level shunt detected by color flow Doppler.  LEFT VENTRICLE PLAX 2D LVIDd:         4.70 cm   Diastology LVIDs:         3.40 cm   LV e' medial:    10.20 cm/s LV PW:         1.50 cm   LV E/e' medial:  6.3 LV IVS:        1.00 cm   LV e' lateral:   12.70 cm/s LVOT diam:     2.10 cm   LV E/e' lateral: 5.0 LV SV:         39 LV SV Index:   21 LVOT Area:     3.46 cm  RIGHT VENTRICLE RV Basal diam:  3.90 cm RV Mid diam:    3.10 cm RV S prime:     16.00 cm/s LEFT ATRIUM             Index        RIGHT ATRIUM           Index LA diam:        4.30 cm 2.33 cm/m   RA Area:     13.30 cm LA Vol (A2C):   47.3 ml 25.62 ml/m  RA Volume:   29.10 ml  15.76 ml/m LA Vol (A4C):   29.9 ml 16.20 ml/m LA Biplane Vol: 40.3 ml 21.83 ml/m  AORTIC VALVE AV Area (Vmax):    2.52 cm AV Area (Vmean):   2.31 cm AV Area (VTI):     2.19 cm AV Vmax:           92.40 cm/s AV Vmean:          69.250 cm/s AV VTI:            0.179  m AV Peak Grad:      3.4 mmHg AV Mean Grad:      2.0 mmHg LVOT Vmax:         67.10 cm/s LVOT Vmean:        46.200 cm/s LVOT VTI:          0.113 m LVOT/AV VTI ratio: 0.63  AORTA Ao Root diam: 3.00 cm MITRAL VALVE               TRICUSPID VALVE MV Area (PHT): 3.72 cm    TR Peak grad:   23.4 mmHg MV Decel Time: 204 msec    TR Vmax:        242.00 cm/s MV E velocity: 63.90 cm/s MV A velocity: 60.30 cm/s  SHUNTS MV E/A ratio:  1.06        Systemic VTI:  0.11 m                            Systemic Diam: 2.10 cm Lorine Bears MD Electronically signed by  Lorine Bears MD Signature Date/Time: 08/06/2022/12:28:39 PM    Final      Medications:    meropenem (MERREM) IV 1 g (08/08/22 0946)    heparin  5,000 Units Subcutaneous Q8H   multivitamin with minerals  1 tablet Oral Daily   pantoprazole  40 mg Oral BID   sodium bicarbonate  1,300 mg Oral BID    Assessment/ Plan:     37 y.o. male with medical problems of gout, inflammatory arthritis, history of bilateral necrosis of femoral heads-MRI September 2023 was admitted on 08/03/2022 for lower abdominal pain, decreased urine output, generalized malaise, headache and nausea.   #1: Acute kidney injury with proteinuria on possible chronic kidney disease: Patient may have acute kidney injury secondary to long history of diarrhea complicated by the use of nonsteroidal anti-inflammatory drugs.   Serologic workup negative  Creatinine improving with oral intake. Encourage to maintain.   No acute need for dialysis at this time  #2: Pyelonephritis: Blood culture positive for E Coli bacteremia. Continue treatments with meropenem.   #3: Metabolic acidosis: Metabolic acidosis can be secondary to acute kidney injury complicated by diarrhea.    Bicarb corrected with oral medications.   #4: Diarrhea: Diarrhea decreasing.  C. difficile  negative.      LOS: 5 [facility] 7/31/202411:51 AM

## 2022-08-08 NOTE — Progress Notes (Addendum)
Mobility Specialist - Progress Note   08/08/22 1551  Mobility  Activity Ambulated with assistance in hallway;Ambulated with assistance in room  Level of Assistance Standby assist, set-up cues, supervision of patient - no hands on  Assistive Device Crutches  Distance Ambulated (ft) 30 ft  Activity Response Tolerated well  $Mobility charge 1 Mobility  Mobility Specialist Start Time (ACUTE ONLY) 1520  Mobility Specialist Stop Time (ACUTE ONLY) 1540  Mobility Specialist Time Calculation (min) (ACUTE ONLY) 20 min   Pt supine upon entry, utilizing RA. Pt completed bed mob indep and STS with WBOS to crutches SBA (uses crutches prior to admission). Pt amb to the NS and back SBA-- MS noticed dorsiflexion of the toes when amb. Pt expresses that he has a pain in BLE when amb-- states a "pulling feeling" on the side of both feet and pain in the heel of the left foot. Returned to the room, left supine with alarm set and needs within reach. MS messages MD for PT consult. RN notified.   Zetta Bills Mobility Specialist 08/08/22 4:01 PM

## 2022-08-08 NOTE — Plan of Care (Signed)

## 2022-08-08 NOTE — Progress Notes (Signed)
Progress Note   Patient: Mason Russell MVH:[id number] DOB: Jul 06, 1985 DOA: 08/03/2022     5 DOS: the patient was seen and examined on 08/08/2022   Brief hospital course:  Mason Russell is a 37 y.o. male with medical history significant of inflammatory arthritis, bilateral necrosis of femoral heads presented to ED with complaints of lower abdominal pain and dysuria for the past few days.   Patient was also experiencing watery diarrhea for the past 2 days.  Poor p.o. intake and appetite.  ED course and data reviewed.  Vital stable, labs pertinent for neutrophilic predominant leukocytosis at 20.4, sodium 132, potassium 2.3, chloride 96, CO2 20, blood glucose 177, BUN 51, creatinine 5.13, total protein 8.6 and albumin 3.3.  UA with significant protein urea, microscopic hematuria, pyuria and many bacteria. Chest x-ray was negative for any acute abnormality. CT abdomen and pelvis bilaterally enlarged kidneys with some perinephric stranding, concerning for infectious or inflammatory process. EKG, personally reviewed, normal sinus rhythm.   TRH was consulted for admission due to pyelonephritis. Nephrology was consulted for significant proteinuria and AKI. Blood and urine cultures were obtained and patient was started on ceftriaxone.  7/27: Vitals with temperature of 99.9, HR 102, preliminary blood cultures positive for E. coli with CTX-M gene, suspected ESBL, antibiotics switched with meropenem.  Labs with improving leukocytosis at 16.4, hypokalemia improved with potassium at 3.5 CO2 19, worsening renal function with creatinine at 5.75.  C. difficile and GI pathogen panel negative.Uric acid significantly elevated at 15.7, TSH mildly elevated at 4.867, HIV negative.  Rest of autoimmune labs pending  Autoimmune workup in June 2023 including double-stranded DNA, anti-Jo1 antibodies, ENA's, HLA-B27, Brucella antibodies, Lyme antibodies, anti-CCP antibodies, anticardiolipin antibodies was negative.  ESR  was elevated last year in June 2023 up to 83. He had CT-guided aspiration of the right sacroiliac joint in June 2023.  Nephrology is suspecting renal damage secondary to NSAID use.  Oliguric, bladder scan with no residual.  7/28: Afebrile this morning, had an episode of fever at 100.4 yesterday around 8 PM.  Elevated protein creatinine ratio at 1.85.  Urine and blood culture both growing ESBL E. coli-sensitive to carbapenems, Bactrim and pip-tazo.  Worsening renal function, borderline UOP of about  1300 recorded.  Worsening abdominal discomfort and some distention, stopping IV fluid, ruling out ascites, may be getting fluid overload.  Breathing comfortable and remained on room air. Might need to start on dialysis, if renal function shows no sign of improvement in the next few days.  7/29: Hemodynamically stable.  Remained afebrile over the past 24 hours.  Renal function seems stable or at least plateaued.  Remains oliguric.  Elevated BNP at 855 but patient also has renal failure.  Echocardiogram was normal.  7/30: Vital stable.  Good urinary output yesterday, 3400 recorded with improvement in urinary function.  Complaining of pretty significant mid to lower back pain with some tenderness, ordered thoracic and lumbar spine MRI to rule out any osteo or discitis.  Also involving ID to help with type and duration of antibiotic.  7/31:Vital stable. MR negative for osteo or discitis,improving renal function.   Assessment and Plan: * Pyelonephritis E. coli bacteremia. Presented with dysuria and lower abdominal pain, CT abdomen with concern of pyelonephritis.  UA positive for >300 protein, large leukocytes, many bacteria and microscopic hematuria.  Urine and blood cultures with ESBL E. coli.  No fever, leukocytosis.  Did not meet criteria for sepsis. Initially received Rocephin which was later switched with meropenem -Continue with  meropenem -Echocardiogram normal -MRI of thoracic and lumbar spine  was negative for any osteo or discitis -Repeat blood cultures done yesterday remain negative so far -ID consult to help with type and duration of antibiotic   AKI (acute kidney injury) (HCC) Patient with significant protein urea and AKI which is new from his baseline.  Worsening renal function despite getting IV fluid.  Concern of NSAID induced renal injury per nephrology.  Elevated protein creatinine ratio.  ANA normal, complement C3 and C4 mildly elevated, ANCA negative. Renal function started improving with improvement in UOP Nephrology is on board -Monitor renal function -Strictly avoid all NSAID -Avoid nephrotoxins  Metabolic acidosis Improved, likely secondary to renal disease. -Discontinue bicarb tablets and monitor  Hypokalemia Resolved. Potassium at 2.3 on admission ,  Likely secondary to GI losses with diarrhea. -Continue to monitor and replete as needed  Diarrhea Resolved Watery diarrhea for the past 2 days.  No recent travel. -C. difficile colitis PCR and GI pathogen panel negative -Supportive care and hydration  History of inflammatory arthritis Patient was diagnosed with inflammatory arthritis in June 2023 and was evaluated by ID at that time.  He was recommended to follow-up with rheumatology for further workup, apparently did not follow-up with rheumatology. Prior workup done last year was negative -Pending ANA, ANCA, hep C, C3 and C4 labs -Need rheumatologic workup especially now he has significant protein urea  Bilateral femoral head necrosis (HCC) Patient was found to have bilateral femoral head necrosis on MRI done during prior admission in 2023. Also has significant bilateral knee pain. -Outpatient follow-up with orthopedic surgery  Back pain Patient with new onset back pain involving mid to lower back, along with tenderness. Has positive blood cultures. -MRI of thoracic and lumbar spine with some disc protrusions but negative for osteo or discitis -PT  evaluation and treatment   Subjective: Patient was sitting in chair and eating breakfast during morning rounds.  No new concern.  Making good urine now.  Physical Exam: Vitals:   08/07/22 2017 08/08/22 0727 08/08/22 1512 08/08/22 1534  BP: (!) 140/91 120/85 (!) 126/92 122/86  Pulse: 97 95 87 95  Resp: 18 17 20 16   Temp: 98.5 F (36.9 C) 98.4 F (36.9 C) 97.8 F (36.6 C) 98 F (36.7 C)  TempSrc: Oral Oral  Oral  SpO2: 99% 95% 99% 97%  Weight:      Height:       General.  Malnourished gentleman, in no acute distress. Pulmonary.  Lungs clear bilaterally, normal respiratory effort. CV.  Regular rate and rhythm, no JVD, rub or murmur. Abdomen.  Soft, nontender, nondistended, BS positive. CNS.  Alert and oriented .  No focal neurologic deficit. Extremities.  No edema, no cyanosis, pulses intact and symmetrical. Psychiatry.  Judgment and insight appears normal.   Data Reviewed: Prior data reviewed  Family Communication: Discussed with patient  Disposition: Status is: Inpatient Remains inpatient appropriate because: Severity of illness  Planned Discharge Destination: Home  DVT prophylaxis.  Subcu heparin Time spent: 44 minutes  This record has been created using Conservation officer, historic buildings. Errors have been sought and corrected,but may not always be located. Such creation errors do not reflect on the standard of care.   Author: Arnetha Courser, MD 08/08/2022 3:59 PM  For on call review [email or web address].

## 2022-08-09 ENCOUNTER — Other Ambulatory Visit: Payer: Self-pay

## 2022-08-09 DIAGNOSIS — E870 Hyperosmolality and hypernatremia: Secondary | ICD-10-CM | POA: Insufficient documentation

## 2022-08-09 LAB — GLUCOSE, CAPILLARY: Glucose-Capillary: 106 mg/dL — ABNORMAL HIGH (ref 70–99)

## 2022-08-09 MED ORDER — SODIUM CHLORIDE 0.9 % IV SOLN
1.0000 g | INTRAVENOUS | Status: DC
  Administered 2022-08-10: 1000 mg via INTRAVENOUS
  Filled 2022-08-09: qty 1

## 2022-08-09 MED ORDER — SODIUM CHLORIDE 0.9 % IV SOLN
1.0000 g | Freq: Three times a day (TID) | INTRAVENOUS | Status: AC
  Administered 2022-08-09 (×2): 1 g via INTRAVENOUS
  Filled 2022-08-09 (×2): qty 20

## 2022-08-09 MED ORDER — ALLOPURINOL 100 MG PO TABS
100.0000 mg | ORAL_TABLET | Freq: Every day | ORAL | Status: DC
  Administered 2022-08-09 – 2022-08-10 (×2): 100 mg via ORAL
  Filled 2022-08-09 (×2): qty 1

## 2022-08-09 MED ORDER — SODIUM CHLORIDE 0.45 % IV SOLN: INTRAVENOUS | Status: AC

## 2022-08-09 MED ORDER — SODIUM CHLORIDE 0.9 % IV SOLN: 1.0000 g | Freq: Once | INTRAVENOUS | Status: DC

## 2022-08-09 NOTE — Assessment & Plan Note (Signed)
Patient developed mild hypernatremia with sodium at 146.  Likely due to auto diuresis with improving renal function. -Ordered some hypertonic saline-trying to avoid D5 due to borderline hyperglycemia. -Continue to monitor

## 2022-08-09 NOTE — Progress Notes (Addendum)
Progress Note   Patient: Mason Russell XWR:[id number] DOB: [birth year] DOA: 08/03/2022     6 DOS: the patient was seen and examined on 08/09/2022   Brief hospital course:  Mason Russell is a 37 y.o. male with medical history significant of inflammatory arthritis, bilateral necrosis of femoral heads presented to ED with complaints of lower abdominal pain and dysuria for the past few days.   Patient was also experiencing watery diarrhea for the past 2 days.  Poor p.o. intake and appetite.  ED course and data reviewed.  Vital stable, labs pertinent for neutrophilic predominant leukocytosis at 20.4, sodium 132, potassium 2.3, chloride 96, CO2 20, blood glucose 177, BUN 51, creatinine 5.13, total protein 8.6 and albumin 3.3.  UA with significant protein urea, microscopic hematuria, pyuria and many bacteria. Chest x-ray was negative for any acute abnormality. CT abdomen and pelvis bilaterally enlarged kidneys with some perinephric stranding, concerning for infectious or inflammatory process. EKG, personally reviewed, normal sinus rhythm.   TRH was consulted for admission due to pyelonephritis. Nephrology was consulted for significant proteinuria and AKI. Blood and urine cultures were obtained and patient was started on ceftriaxone.  7/27: Vitals with temperature of 99.9, HR 102, preliminary blood cultures positive for E. coli with CTX-M gene, suspected ESBL, antibiotics switched with meropenem.  Labs with improving leukocytosis at 16.4, hypokalemia improved with potassium at 3.5 CO2 19, worsening renal function with creatinine at 5.75.  C. difficile and GI pathogen panel negative.Uric acid significantly elevated at 15.7, TSH mildly elevated at 4.867, HIV negative.  Rest of autoimmune labs pending  Autoimmune workup in June 2023 including double-stranded DNA, anti-Jo1 antibodies, ENA's, HLA-B27, Brucella antibodies, Lyme antibodies, anti-CCP antibodies, anticardiolipin antibodies was negative.  ESR  was elevated last year in June 2023 up to 83. He had CT-guided aspiration of the right sacroiliac joint in June 2023.  Nephrology is suspecting renal damage secondary to NSAID use.  Oliguric, bladder scan with no residual.  7/28: Afebrile this morning, had an episode of fever at 100.4 yesterday around 8 PM.  Elevated protein creatinine ratio at 1.85.  Urine and blood culture both growing ESBL E. coli-sensitive to carbapenems, Bactrim and pip-tazo.  Worsening renal function, borderline UOP of about  1300 recorded.  Worsening abdominal discomfort and some distention, stopping IV fluid, ruling out ascites, may be getting fluid overload.  Breathing comfortable and remained on room air. Might need to start on dialysis, if renal function shows no sign of improvement in the next few days.  7/29: Hemodynamically stable.  Remained afebrile over the past 24 hours.  Renal function seems stable or at least plateaued.  Remains oliguric.  Elevated BNP at 855 but patient also has renal failure.  Echocardiogram was normal.  7/30: Vital stable.  Good urinary output yesterday, 3400 recorded with improvement in urinary function.  Complaining of pretty significant mid to lower back pain with some tenderness, ordered thoracic and lumbar spine MRI to rule out any osteo or discitis.  Also involving ID to help with type and duration of antibiotic.  7/31:Vital stable. MR negative for osteo or discitis,improving renal function.  8/1: Renal functions continue to improve.  Mild hypernatremia with sodium at 146 today.  Giving some hypotonic saline as trying to avoid D5 due to borderline hyperglycemia. Arrangements are made to complete a 10 day course of carbapenem at the surgery center as patient is uninsured.  Appointments were made starting from Saturday morning.  Per ID no midline needed and patient can use his  peripheral line.  He can be discharged tomorrow after getting a dose of ertapenem.   Assessment and Plan: *  Pyelonephritis E. coli bacteremia. Presented with dysuria and lower abdominal pain, CT abdomen with concern of pyelonephritis.  UA positive for >300 protein, large leukocytes, many bacteria and microscopic hematuria.  Urine and blood cultures with ESBL E. coli.  No fever, leukocytosis.  Did not meet criteria for sepsis. Initially received Rocephin which was later switched with meropenem -Continue with meropenem-will be switched to ertapenem from tomorrow morning -Echocardiogram normal -MRI of thoracic and lumbar spine was negative for any osteo or discitis -Repeat blood cultures from 7/30 remain negative so far -Per ID patient will need 10 days of carbapenem, uninsured patient so outpatient therapy is not possible.  Arrangements are made at the surgery center to get daily ertapenem until 08/13/2022.  Patient will be discharged tomorrow after getting a dose of ertapenem and start his outpatient therapy from Saturday.   AKI (acute kidney injury) (HCC) Patient with significant protein urea and AKI which is new from his baseline.  Worsening renal function despite getting IV fluid.  Concern of NSAID induced renal injury per nephrology.  Elevated protein creatinine ratio.  ANA normal, complement C3 and C4 mildly elevated, ANCA negative. Renal function started improving with improvement in UOP Nephrology is on board -Monitor renal function -Strictly avoid all NSAID -Avoid nephrotoxins  Metabolic acidosis Improved, likely secondary to renal disease. -Discontinue bicarb tablets and monitor  Hypokalemia Resolved. Potassium at 2.3 on admission ,  Likely secondary to GI losses with diarrhea. -Continue to monitor and replete as needed  Diarrhea Resolved Watery diarrhea for the past 2 days.  No recent travel. -C. difficile colitis PCR and GI pathogen panel negative -Supportive care and hydration  History of inflammatory arthritis Patient was diagnosed with inflammatory arthritis in June 2023 and  was evaluated by ID at that time.  He was recommended to follow-up with rheumatology for further workup, apparently did not follow-up with rheumatology. Prior workup done last year was negative -Pending ANA, ANCA, hep C, C3 and C4 labs -Need rheumatologic workup especially now he has significant protein urea  Bilateral femoral head necrosis (HCC) Patient was found to have bilateral femoral head necrosis on MRI done during prior admission in 2023. Also has significant bilateral knee pain. -Outpatient follow-up with orthopedic surgery  Hypernatremia Patient developed mild hypernatremia with sodium at 146.  Likely due to auto diuresis with improving renal function. -Ordered some hypertonic saline-trying to avoid D5 due to borderline hyperglycemia. -Continue to monitor  Back pain Patient with new onset back pain involving mid to lower back, along with tenderness. Has positive blood cultures. -MRI of thoracic and lumbar spine with some disc protrusions but negative for osteo or discitis -PT evaluation and treatment   Subjective: Patient was seen and examined today.  No new concern.  Continue to make good amount of urine.  Wants to go home.  Physical Exam: Vitals:   08/08/22 1534 08/08/22 2023 08/09/22 0549 08/09/22 0808  BP: 122/86 (!) 138/94 (!) 126/92 (!) 143/99  Pulse: 95 94 82 82  Resp: 16 18 18 16   Temp: 98 F (36.7 C) 97.9 F (36.6 C) 98.2 F (36.8 C) 98.1 F (36.7 C)  TempSrc: Oral Oral Oral Oral  SpO2: 97% 97% 96% 95%  Weight:      Height:       General.  Well-developed gentleman, in no acute distress. Pulmonary.  Lungs clear bilaterally, normal respiratory effort. CV.  Regular rate and rhythm, no JVD, rub or murmur. Abdomen.  Soft, nontender, nondistended, BS positive. CNS.  Alert and oriented .  No focal neurologic deficit. Extremities.  No edema, no cyanosis, pulses intact and symmetrical. Psychiatry.  Judgment and insight appears normal.   Data Reviewed: Prior  data reviewed  Family Communication: Discussed with patient  Disposition: Status is: Inpatient Remains inpatient appropriate because: Severity of illness  Planned Discharge Destination: Home  DVT prophylaxis.  Subcu heparin Time spent: 45 minutes  This record has been created using Conservation officer, historic buildings. Errors have been sought and corrected,but may not always be located. Such creation errors do not reflect on the standard of care.   Author: Arnetha Courser, MD 08/09/2022 1:32 PM  For on call review [email or web address].

## 2022-08-09 NOTE — Discharge Instructions (Signed)
For antibiotic infusion, enter main entrance (also called [facility] entrance) and report to desk on first floor.  Your infusion will be performed at [facility]. If you have questions or concerns call, [phone] or [phone].

## 2022-08-09 NOTE — Progress Notes (Signed)
Diagnosis: ESBl ecoli bacteremia Baseline Creatinine 1.71  No Known Allergies  OPAT Orders Discharge antibiotics: Ertapenem 1 gram Iv every 24 hours  Duration: 10 days End Date: 08/13/22  [facility] Care Per Protocol:  Labs on Sunday   _X_ CBC with differential _X_ CMP  _  Clinic Follow Up Appt: not required   Call [phone] with any questions

## 2022-08-09 NOTE — Plan of Care (Signed)

## 2022-08-09 NOTE — Progress Notes (Signed)
[facility]  PROGRESS NOTE   Subjective:   Renal function continues to improve. Creatinine down to 1.7. Patient in good spirits.  Objective:  Vital signs: Blood pressure (!) 143/99, pulse 82, temperature 98.1 F (36.7 C), temperature source Oral, resp. rate 16, height 5\' 5"  (1.651 m), weight 77.1 kg, SpO2 95%.  Intake/Output Summary (Last 24 hours) at 08/09/2022 1335 Last data filed at 08/09/2022 1100 Gross per 24 hour  Intake 920 ml  Output 1625 ml  Net -705 ml   Filed Weights   08/03/22 0949  Weight: 77.1 kg     Physical Exam: General:  NAD  Head:  Normocephalic, atraumatic. Moist oral mucosal membranes  Eyes:  Anicteric  Lungs:   Clear to auscultation, normal effort  Heart:  S1S2 no rubs  Abdomen:   Soft, nontender, bowel sounds present  Extremities:  No peripheral edema.  Neurologic:  Awake, alert, following commands  Skin:  No lesions  Access: None    Basic Metabolic Panel: Recent Labs  Lab 08/03/22 0955 08/03/22 0956 08/05/22 0858 08/06/22 0346 08/07/22 0515 08/08/22 0515 08/09/22 0531  NA  --    < > 137 139 144 143 146*  K  --    < > 3.8 4.1 4.3 4.3 4.5  CL  --    < > 105 108 112* 111 110  CO2  --    < > 17* 20* 21* 25 24  GLUCOSE  --    < > 110* 115* 126* 125* 118*  BUN  --    < > 60* 55* 52* 39* 26*  CREATININE 5.07*   < > 6.29* 5.73* 3.90* 2.64* 1.71*  CALCIUM  --    < > 8.2* 8.3* 8.0* 8.1* 8.2*  MG 1.9  --   --   --   --   --   --   PHOS  --    < > 2.7 3.3 4.0 4.0 3.8   < > = values in this interval not displayed.   GFR: Estimated Creatinine Clearance: 56.6 mL/min (A) (by C-G formula based on SCr of 1.71 mg/dL (H)).  Liver Function Tests: Recent Labs  Lab 08/03/22 0956 08/04/22 1606 08/05/22 0858 08/06/22 0346 08/07/22 0515 08/08/22 0515 08/09/22 0531  AST 22  --   --   --   --   --   --   ALT 28  --   --   --   --   --   --   ALKPHOS 76  --   --   --   --   --   --   BILITOT 0.8  --   --   --   --   --   --   PROT  8.6*  --   --   --   --   --   --   ALBUMIN 3.3*   < > 2.5* 2.4* 2.3* 2.5* 2.4*   < > = values in this interval not displayed.   No results for input(s): "LIPASE", "AMYLASE" in the last 168 hours. No results for input(s): "AMMONIA" in the last 168 hours.  CBC: Recent Labs  Lab 08/03/22 0956 08/04/22 0348 08/08/22 0515  WBC 20.4* 16.4* 10.9*  NEUTROABS 17.5*  --   --   HGB 13.5 12.6* 11.4*  HCT 38.4* 38.1* 35.3*  MCV 85.9 89.0 91.0  PLT 224 228 679*     HbA1C: Hgb A1c MFr Bld  Date/Time Value Ref Range Status  08/03/2022 09:55 AM 6.1 (H) 4.8 - 5.6 % Final    Comment:    (NOTE)         Prediabetes: 5.7 - 6.4         Diabetes: >6.4         Glycemic control for adults with diabetes: <7.0     Urinalysis: No results for input(s): "COLORURINE", "LABSPEC", "PHURINE", "GLUCOSEU", "HGBUR", "BILIRUBINUR", "KETONESUR", "PROTEINUR", "UROBILINOGEN", "NITRITE", "LEUKOCYTESUR" in the last 72 hours.  Invalid input(s): "APPERANCEUR"     Imaging: [place] EKG SITE RITE  Result Date: 08/09/2022 If Site Rite image not attached, placement could not be confirmed due to current cardiac rhythm.  MR LUMBAR SPINE WO CONTRAST  Result Date: 08/07/2022 CLINICAL DATA:  Initial evaluation for mid and lower back pain, history of bacteremia, concern for infection. EXAM: MRI THORACIC AND LUMBAR SPINE WITHOUT CONTRAST TECHNIQUE: Multiplanar and multiecho pulse sequences of the thoracic and lumbar spine were obtained without intravenous contrast. COMPARISON:  Prior study from 09/28/2021. FINDINGS: MRI THORACIC SPINE FINDINGS Alignment: Mild straightening of the normal mid and lower thoracic kyphosis. No listhesis. Vertebrae: Vertebral body height maintained without acute or chronic fracture. Bone marrow signal intensity within normal limits. No worrisome osseous lesions. No imaging findings to suggest osteomyelitis discitis or septic arthritis. Cord:  Normal signal and morphology.  No epidural collections.  Paraspinal and other soft tissues: Paraspinous soft tissues demonstrate no acute inflammatory changes. No soft tissue collections. Trace layering bilateral pleural effusions noted. Disc levels: No significant disc pathology seen within the thoracic spine for age. No focal disc herniation. No stenosis or impingement. MRI LUMBAR SPINE FINDINGS Segmentation:  Examination degraded by motion artifact. Standard segmentation. Lowest well-formed disc space labeled the L5-S1 level. Alignment: Straightening of the normal lumbar lordosis. No significant listhesis. Vertebrae: Vertebral body height maintained without acute or chronic fracture. Bone marrow signal intensity within normal limits. No worrisome osseous lesions. No evidence for osteomyelitis discitis or septic arthritis. Conus medullaris and cauda equina: Conus extends to the T12 level. Conus and cauda equina appear normal. No epidural collections. Paraspinal and other soft tissues: Paraspinous soft tissues demonstrate no acute finding. Disc levels: L1-2:  Unremarkable. L2-3:  Unremarkable. L3-4: Normal interspace. Mild bilateral facet hypertrophy. No stenosis. L4-5: Disc desiccation with mild circumferential disc bulge. Superimposed small right foraminal disc protrusion closely approximates the exiting right L4 nerve root. Mild facet hypertrophy. No spinal stenosis. Mild right L4 foraminal narrowing. Left neural foramen remains patent. L5-S1: Disc desiccation with diffuse disc bulge. Superimposed small central disc protrusion closely approximates the descending S1 nerve roots without frank neural impingement or displacement. Mild facet hypertrophy. No canal or lateral recess stenosis. Mild right L5 foraminal narrowing. Left neural foramina remains patent. IMPRESSION: MRI THORACIC SPINE IMPRESSION: 1. No evidence for acute infection within the thoracic spine. 2. No significant disc pathology, stenosis, or evidence for neural impingement. MRI LUMBAR SPINE IMPRESSION:  1. No evidence for acute infection within the lumbar spine. 2. Small right foraminal disc protrusion at L4-5, potentially affecting the exiting right L4 nerve root. 3. Small central disc protrusion at L5-S1, closely approximating the descending S1 nerve roots without frank neural impingement. Electronically Signed   By: Rise Mu M.D.   On: 08/07/2022 18:43   MR THORACIC SPINE WO CONTRAST  Result Date: 08/07/2022 CLINICAL DATA:  Initial evaluation for mid and lower back pain, history of bacteremia, concern for infection. EXAM: MRI THORACIC AND LUMBAR SPINE WITHOUT CONTRAST TECHNIQUE: Multiplanar and multiecho pulse sequences of the thoracic  and lumbar spine were obtained without intravenous contrast. COMPARISON:  Prior study from 09/28/2021. FINDINGS: MRI THORACIC SPINE FINDINGS Alignment: Mild straightening of the normal mid and lower thoracic kyphosis. No listhesis. Vertebrae: Vertebral body height maintained without acute or chronic fracture. Bone marrow signal intensity within normal limits. No worrisome osseous lesions. No imaging findings to suggest osteomyelitis discitis or septic arthritis. Cord:  Normal signal and morphology.  No epidural collections. Paraspinal and other soft tissues: Paraspinous soft tissues demonstrate no acute inflammatory changes. No soft tissue collections. Trace layering bilateral pleural effusions noted. Disc levels: No significant disc pathology seen within the thoracic spine for age. No focal disc herniation. No stenosis or impingement. MRI LUMBAR SPINE FINDINGS Segmentation:  Examination degraded by motion artifact. Standard segmentation. Lowest well-formed disc space labeled the L5-S1 level. Alignment: Straightening of the normal lumbar lordosis. No significant listhesis. Vertebrae: Vertebral body height maintained without acute or chronic fracture. Bone marrow signal intensity within normal limits. No worrisome osseous lesions. No evidence for osteomyelitis  discitis or septic arthritis. Conus medullaris and cauda equina: Conus extends to the T12 level. Conus and cauda equina appear normal. No epidural collections. Paraspinal and other soft tissues: Paraspinous soft tissues demonstrate no acute finding. Disc levels: L1-2:  Unremarkable. L2-3:  Unremarkable. L3-4: Normal interspace. Mild bilateral facet hypertrophy. No stenosis. L4-5: Disc desiccation with mild circumferential disc bulge. Superimposed small right foraminal disc protrusion closely approximates the exiting right L4 nerve root. Mild facet hypertrophy. No spinal stenosis. Mild right L4 foraminal narrowing. Left neural foramen remains patent. L5-S1: Disc desiccation with diffuse disc bulge. Superimposed small central disc protrusion closely approximates the descending S1 nerve roots without frank neural impingement or displacement. Mild facet hypertrophy. No canal or lateral recess stenosis. Mild right L5 foraminal narrowing. Left neural foramina remains patent. IMPRESSION: MRI THORACIC SPINE IMPRESSION: 1. No evidence for acute infection within the thoracic spine. 2. No significant disc pathology, stenosis, or evidence for neural impingement. MRI LUMBAR SPINE IMPRESSION: 1. No evidence for acute infection within the lumbar spine. 2. Small right foraminal disc protrusion at L4-5, potentially affecting the exiting right L4 nerve root. 3. Small central disc protrusion at L5-S1, closely approximating the descending S1 nerve roots without frank neural impingement. Electronically Signed   By: Rise Mu M.D.   On: 08/07/2022 18:43     Medications:    sodium chloride 100 mL/hr at 08/09/22 0950   [START ON 08/10/2022] ertapenem     meropenem (MERREM) IV      allopurinol  100 mg Oral Daily   heparin  5,000 Units Subcutaneous Q8H   multivitamin with minerals  1 tablet Oral Daily   pantoprazole  40 mg Oral BID    Assessment/ Plan:     37 y.o. male with medical problems of gout, inflammatory  arthritis, history of bilateral necrosis of femoral heads-MRI September 2023 was admitted on 08/03/2022 for lower abdominal pain, decreased urine output, generalized malaise, headache and nausea.   #1: Acute kidney injury with proteinuria on possible chronic kidney disease: Patient may have acute kidney injury secondary to long history of diarrhea complicated by the use of nonsteroidal anti-inflammatory drugs.   Renal function continues to improve.  Creatinine down to 1.7.  Continue hydration and IV meropenem.  #2:  Acute pyelonephritis: Blood culture positive for E Coli bacteremia.  Treatment continued with IV meropenem.   #3:  Acute metabolic acidosis: Metabolic acidosis can be secondary to acute kidney injury complicated by diarrhea.    Serum bicarbonate  currently 24.  Now resolved.   #4: Diarrhea: Diarrhea decreasing.  C. difficile  negative.      LOS: 6 Robel Wuertz [facility] 8/1/20241:35 PM

## 2022-08-09 NOTE — Progress Notes (Signed)
PHARMACY NOTE:  ANTIMICROBIAL RENAL DOSAGE ADJUSTMENT  Current antimicrobial regimen includes a mismatch between antimicrobial dosage and estimated renal function.  As per policy approved by the Pharmacy & Therapeutics and Medical Executive Committees, the antimicrobial dosage will be adjusted accordingly.  Current antimicrobial dosage:  meropenem 1000mg  IV q12  Indication: ESBL E coli bacteremia and UTI  Renal Function:  Estimated Creatinine Clearance: 56.6 mL/min (A) (by C-G formula based on SCr of 1.71 mg/dL (H)). []      On intermittent HD, scheduled: []      On CRRT    Antimicrobial dosage has been changed to:  meropenem 1gm IV q8h  Additional comments: Renal function looks to be recovering from AKI w/ good UOP   Thank you for allowing pharmacy to be a part of this patient's care.  Juliette Alcide, PharmD, BCPS, BCIDP Work Cell: [phone] 08/09/2022 9:25 AM

## 2022-08-09 NOTE — Progress Notes (Addendum)
Date of Admission:  08/03/2022      ID: Mason Russell is a 37 y.o. male Principal Problem:   Pyelonephritis Active Problems:   Hypokalemia   History of inflammatory arthritis   AKI (acute kidney injury) (HCC)   Diarrhea   Bilateral femoral head necrosis (HCC)   Metabolic acidosis   Back pain   Infection due to ESBL-producing Escherichia coli   Bacteremia due to Escherichia coli   Hypernatremia    Subjective: Feeling better   Medications:   allopurinol  100 mg Oral Daily   heparin  5,000 Units Subcutaneous Q8H   multivitamin with minerals  1 tablet Oral Daily   pantoprazole  40 mg Oral BID    Objective: Vital signs in last 24 hours: Patient Vitals for the past 24 hrs:  BP Temp Temp src Pulse Resp SpO2  08/09/22 1541 (!) 124/90 98 F (36.7 C) Oral 82 18 100 %  08/09/22 0808 (!) 143/99 98.1 F (36.7 C) Oral 82 16 95 %  08/09/22 0549 (!) 126/92 98.2 F (36.8 C) Oral 82 18 96 %  08/08/22 2023 (!) 138/94 97.9 F (36.6 C) Oral 94 18 97 %       PHYSICAL EXAM:  General: Alert, cooperative, no distress, appears stated age.  Head: Normocephalic, without obvious abnormality, atraumatic. Eyes: Conjunctivae clear, anicteric sclerae. Pupils are equal ENT Nares normal. No drainage or sinus tenderness. Lips, mucosa, and tongue normal. No Thrush Neck: Supple, symmetrical, no adenopathy, thyroid: non tender no carotid bruit and no JVD. Back: No CVA tenderness. Lungs: Clear to auscultation bilaterally. No Wheezing or Rhonchi. No rales. Heart: Regular rate and rhythm, no murmur, rub or gallop. Abdomen: Soft, non-tender,not distended. Bowel sounds normal. No masses Extremities: atraumatic, no cyanosis. No edema. No clubbing Skin: No rashes or lesions. Or bruising Lymph: Cervical, supraclavicular normal. Neurologic: Grossly non-focal  Lab Results    Latest Ref Rng & Units 08/08/2022    5:15 AM 08/04/2022    3:48 AM 08/03/2022    9:56 AM  CBC  WBC 4.0 - 10.5 K/uL 10.9   16.4  20.4   Hemoglobin 13.0 - 17.0 g/dL 09.8  11.9  14.7   Hematocrit 39.0 - 52.0 % 35.3  38.1  38.4   Platelets 150 - 400 K/uL 679  228  224        Latest Ref Rng & Units 08/09/2022    5:31 AM 08/08/2022    5:15 AM 08/07/2022    5:15 AM  CMP  Glucose 70 - 99 mg/dL 829  562  130   BUN 6 - 20 mg/dL 26  39  52   Creatinine 0.61 - 1.24 mg/dL 8.65  7.84  6.96   Sodium 135 - 145 mmol/L 146  143  144   Potassium 3.5 - 5.1 mmol/L 4.5  4.3  4.3   Chloride 98 - 111 mmol/L 110  111  112   CO2 22 - 32 mmol/L 24  25  21    Calcium 8.9 - 10.3 mg/dL 8.2  8.1  8.0       Microbiology: BC ESBL ecoli bacteremia Studies/Results: [place] EKG SITE RITE  Result Date: 08/09/2022 If Site Rite image not attached, placement could not be confirmed due to current cardiac rhythm.    Assessment/Plan: ?ESBL ecoli bacteremia and UTI- on Meropenem Will  give for 10-14 days He has no insurance,  Can get IV antibiotic at day surgery on   as improved significantly and repeat BC NG  will do until 08/13/22 which will be 10 days  Unusual for a 37 year old male to have bacteremia and UTI Could be because he is kind of immunocompromise with being on prednisone and also likely has been taking NSAID which has attributed to the kidney injury Diarrhea- cdiff and GI panel neg- much improved   Polyarthritis seronegative - pt has been taking predniosne 5 mg x 3 every day- he [facility] it [facility]- on allopurinol     AKI improving ?  Discussed the management with the patient OPAT note written Patient will go to day surgery to receive IV ertapenem on discharge.

## 2022-08-09 NOTE — Evaluation (Signed)
Physical Therapy Evaluation Patient Details Name: Mason Russell MRN: [id number] DOB: [birth year] Today's Date: 08/09/2022  History of Present Illness  Dmichael Wesler is a 36yoM who comes to [facility] on 08/03/22 c ABD pain, diarrhea, SOB.   CT abdomen with concern of pyelonephritis, UA (+), also being treated for AKI, hypokalemia. Pt was here in June 2023 with severe inflammatory arthritis flare, never followed up with rheumatology as recommended- has known severe femoral head necrosis bilat.  Clinical Impression  Pt familiar to author from prior admission. Pt agreeable to evaluation, reports to be at baseline for basic mobility with exception to worse pain control in feet. Pt denies any need for DME or services at DC. Will continue to facilitate opportunities for mobility while admitted as he uses bilat AC at baseline and now has an IV in place.       If plan is discharge home, recommend the following: Assist for transportation;Help with stairs or ramp for entrance   Can travel by private vehicle        Equipment Recommendations None recommended by PT  Recommendations for Other Services       Functional Status Assessment Patient has not had a recent decline in their functional status     Precautions / Restrictions Precautions Precautions: Fall Restrictions Weight Bearing Restrictions: No      Mobility  Bed Mobility Overal bed mobility: Modified Independent             General bed mobility comments: left EOB, uses bed rail    Transfers Overall transfer level: Modified independent Equipment used: Crutches                    Ambulation/Gait   Gait Distance (Feet): 70 Feet Assistive device: Crutches         General Gait Details: declines additional AMB, reluctant to go out of room inititally. Says he's moving fine, as good as his typical baseline.  Stairs            Wheelchair Mobility     Tilt Bed    Modified Rankin (Stroke Patients Only)        Balance Overall balance assessment: Modified Independent                                           Pertinent Vitals/Pain Pain Assessment Pain Assessment:  (not rated when cued, says its ok, but is a limiting factor with AMB (feet), as with baseline)    Home Living Family/patient expects to be discharged to:: Private residence Living Arrangements: Parent;Other relatives (mom, dad, sister, nephew) Available Help at Discharge: Family;Available 24 hours/day Type of Home: Apartment Home Access: Stairs to enter Entrance Stairs-Rails: None Entrance Stairs-Number of Steps: 1 + 1   Home Layout: One level Home Equipment: Crutches      Prior Function               Mobility Comments: Not working since May; uses [facility] all the time now. No recent falls at home. ADLs Comments: IND in ADL/IADL     Hand Dominance        Extremity/Trunk Assessment                Communication   Communication: No difficulties  Cognition  General Comments      Exercises     Assessment/Plan    PT Assessment Patient needs continued PT services  PT Problem List Decreased activity tolerance;Decreased mobility       PT Treatment Interventions Functional mobility training;Therapeutic activities;Therapeutic exercise    PT Goals (Current goals can be found in the Care Plan section)  Acute Rehab PT Goals Patient Stated Goal: return to home; have better pain meds for feet PT Goal Formulation: With patient Time For Goal Achievement: 08/23/22 Potential to Achieve Goals: Good    Frequency Min 1X/week     Co-evaluation               AM-PAC PT "6 Clicks" Mobility  Outcome Measure Help needed turning from your back to your side while in a flat bed without using bedrails?: A Little Help needed moving from lying on your back to sitting on the side of a flat bed without using bedrails?: A Little Help  needed moving to and from a bed to a chair (including a wheelchair)?: A Little Help needed standing up from a chair using your arms (e.g., wheelchair or bedside chair)?: A Little Help needed to walk in hospital room?: A Little Help needed climbing 3-5 steps with a railing? : A Little 6 Click Score: 18    End of Session   Activity Tolerance: Patient tolerated treatment well;Patient limited by pain Patient left: in bed;with call bell/phone within reach;with bed alarm set Nurse Communication: Mobility status;Precautions PT Visit Diagnosis: Difficulty in walking, not elsewhere classified (R26.2);Other abnormalities of gait and mobility (R26.89)    Time: 1610-9604 PT Time Calculation (min) (ACUTE ONLY): 22 min   Charges:   PT Evaluation $PT Eval Low Complexity: 1 Low PT Treatments $Therapeutic Activity: 8-22 mins PT General Charges $$ ACUTE PT VISIT: 1 Visit    10:53 AM, 08/09/22 Rosamaria Lints, PT, DPT Physical Therapist - [facility]  [phone] (ASCOM)       Saidy Ormand C 08/09/2022, 10:50 AM

## 2022-08-09 NOTE — Progress Notes (Signed)
Pharmacy - Antimicrobial Stewardship  Patient to complete IV antibiotics for ESBL E coli bacteremia at [facility].  Coordinated care with Drs Nelson Chimes and Rivka Safer as well as Guadalupe Dawn, RN at same day surgery.    Plan Patient to receive ertapenem 1gm IV tomorrow, 8/2, prior to discharge then go to [facility] to received Ertapenem 1gm IV q24h on 8/3 to 8/5 Orders are saved and held on dates of these encounters Orders for CBC/d and CMP signed and held for Sunday 8/4 Patient instructed where to go on Saturday to get infusion Phone numbers to call Same Day Surgery left in discharge AVS info  Juliette Alcide, PharmD, BCPS, BCIDP Work Cell: [phone] 08/09/2022 4:17 PM

## 2022-08-10 DIAGNOSIS — B9620 Unspecified Escherichia coli [E. coli] as the cause of diseases classified elsewhere: Secondary | ICD-10-CM

## 2022-08-10 DIAGNOSIS — A498 Other bacterial infections of unspecified site: Secondary | ICD-10-CM

## 2022-08-10 DIAGNOSIS — Z1612 Extended spectrum beta lactamase (ESBL) resistance: Secondary | ICD-10-CM

## 2022-08-10 DIAGNOSIS — R7881 Bacteremia: Secondary | ICD-10-CM

## 2022-08-10 LAB — GLUCOSE, CAPILLARY: Glucose-Capillary: 98 mg/dL (ref 70–99)

## 2022-08-10 MED ORDER — SODIUM CHLORIDE 0.9 % IV SOLN: 1.0000 g | INTRAVENOUS | Status: AC

## 2022-08-10 MED ORDER — SODIUM CHLORIDE 0.9 % IV SOLN: 1.0000 g | Freq: Once | INTRAVENOUS | Status: DC

## 2022-08-10 NOTE — Progress Notes (Signed)
[facility]  PROGRESS NOTE   Subjective:   Patient seen resting in bed Alert and oriented  No complaints to offer  Adequate urine output  Objective:  Vital signs: Blood pressure (!) 136/100, pulse 81, temperature 98.5 F (36.9 C), resp. rate 19, height 5\' 5"  (1.651 m), weight 77.1 kg, SpO2 94%.  Intake/Output Summary (Last 24 hours) at 08/10/2022 1137 Last data filed at 08/10/2022 0744 Gross per 24 hour  Intake 480 ml  Output 2775 ml  Net -2295 ml   Filed Weights   08/03/22 0949  Weight: 77.1 kg     Physical Exam: General:  NAD  Head:  Normocephalic, atraumatic. Moist oral mucosal membranes  Eyes:  Anicteric  Lungs:   Clear to auscultation, normal effort  Heart:  S1S2 no rubs  Abdomen:   Soft, nontender, bowel sounds present  Extremities:  No peripheral edema.  Neurologic:  Awake, alert, following commands  Skin:  No lesions  Access: None    Basic Metabolic Panel: Recent Labs  Lab 08/05/22 0858 08/06/22 0346 08/07/22 0515 08/08/22 0515 08/09/22 0531  NA 137 139 144 143 146*  K 3.8 4.1 4.3 4.3 4.5  CL 105 108 112* 111 110  CO2 17* 20* 21* 25 24  GLUCOSE 110* 115* 126* 125* 118*  BUN 60* 55* 52* 39* 26*  CREATININE 6.29* 5.73* 3.90* 2.64* 1.71*  CALCIUM 8.2* 8.3* 8.0* 8.1* 8.2*  PHOS 2.7 3.3 4.0 4.0 3.8   GFR: Estimated Creatinine Clearance: 56.6 mL/min (A) (by C-G formula based on SCr of 1.71 mg/dL (H)).  Liver Function Tests: Recent Labs  Lab 08/05/22 0858 08/06/22 0346 08/07/22 0515 08/08/22 0515 08/09/22 0531  ALBUMIN 2.5* 2.4* 2.3* 2.5* 2.4*   No results for input(s): "LIPASE", "AMYLASE" in the last 168 hours. No results for input(s): "AMMONIA" in the last 168 hours.  CBC: Recent Labs  Lab 08/04/22 0348 08/08/22 0515  WBC 16.4* 10.9*  HGB 12.6* 11.4*  HCT 38.1* 35.3*  MCV 89.0 91.0  PLT 228 679*     HbA1C: Hgb A1c MFr Bld  Date/Time Value Ref Range Status  08/03/2022 09:55 AM 6.1 (H) 4.8 - 5.6 % Final     Comment:    (NOTE)         Prediabetes: 5.7 - 6.4         Diabetes: >6.4         Glycemic control for adults with diabetes: <7.0     Urinalysis: No results for input(s): "COLORURINE", "LABSPEC", "PHURINE", "GLUCOSEU", "HGBUR", "BILIRUBINUR", "KETONESUR", "PROTEINUR", "UROBILINOGEN", "NITRITE", "LEUKOCYTESUR" in the last 72 hours.  Invalid input(s): "APPERANCEUR"     Imaging: [place] EKG SITE RITE  Result Date: 08/09/2022 If Site Rite image not attached, placement could not be confirmed due to current cardiac rhythm.    Medications:    ertapenem Stopped (08/10/22 1045)    allopurinol  100 mg Oral Daily   heparin  5,000 Units Subcutaneous Q8H   multivitamin with minerals  1 tablet Oral Daily   pantoprazole  40 mg Oral BID    Assessment/ Plan:     37 y.o. male with medical problems of gout, inflammatory arthritis, history of bilateral necrosis of femoral heads-MRI September 2023 was admitted on 08/03/2022 for lower abdominal pain, decreased urine output, generalized malaise, headache and nausea.   #1: Acute kidney injury with proteinuria on possible chronic kidney disease: Patient may have acute kidney injury secondary to long history of diarrhea complicated by the use of nonsteroidal anti-inflammatory drugs.  Creatinine has improved over this admission.Recommend patient to follow up with PCP at discharge.   #2:  Acute pyelonephritis: Blood culture positive for E Coli bacteremia. Completed meropenem. Now receiving Invanz.    #3:  Acute metabolic acidosis: Metabolic acidosis can be secondary to acute kidney injury complicated by diarrhea.    Serum bicarbonate currently 24.  Resolved.   #4: Diarrhea: Diarrhea decreasing.  C. difficile  negative.      LOS: 7 [facility] 8/2/202411:37 AM

## 2022-08-10 NOTE — Discharge Summary (Signed)
Physician Discharge Summary   Patient: Mason Russell MRN: [id number] DOB: 04-28-1985  Admit date:     08/03/2022  Discharge date: 08/10/22  Discharge Physician: Marcelino Duster   PCP: System, Provider Not In   Recommendations at discharge:    PCP follow up in 1 week Complete Ertapenem course till Monday 08/13/22  Discharge Diagnoses: Principal Problem:   Pyelonephritis Active Problems:   AKI (acute kidney injury) (HCC)   Hypokalemia   Metabolic acidosis   Diarrhea   History of inflammatory arthritis   Bilateral femoral head necrosis (HCC)   Back pain   Infection due to ESBL-producing Escherichia coli   Bacteremia due to Escherichia coli   Hypernatremia  Resolved Problems:   * No resolved hospital problems. *  Hospital Course: Mason Russell is a 37 y.o. male with medical history significant of inflammatory arthritis, bilateral necrosis of femoral heads presented to ED with complaints of lower abdominal pain and dysuria for the past few days.  Patient is admitted to hospitalist service for pyelonephritis, acute kidney injury, hypokalemia.  Patient's urine and blood cultures grew ESBL E. Coli.  Antibiotics changed from Rocephin to meropenem.  Patient's kidney function gradually improved with IV hydration.  ID physician recommended 10 days of Carbapenem therapy end date August 13, 2022.  Arrangements made for infusion at surgery center for next 2 days.  He is hemodynamically stable to be discharged home.  Assessment and Plan:  Pyelonephritis E. coli bacteremia. Presented with dysuria and lower abdominal pain, CT abdomen with concern of pyelonephritis.  UA positive for >300 protein, large leukocytes, many bacteria and microscopic hematuria.  Urine and blood cultures with ESBL E. coli.  No fever, leukocytosis.  Did not meet criteria for sepsis. Initially received Rocephin which was later switched with meropenem for ESBL coverage. Switched from Meropenem to ertapenem once  daily. Echocardiogram normal. MRI of thoracic and lumbar spine was negative for any osteo or discitis. Repeat blood cultures from 7/30 remain negative so far -Per ID patient will need 10 days of carbapenem, uninsured patient so outpatient therapy is not possible.  Arrangements are made at the surgery center to get daily ertapenem until 08/13/2022.  Patient will be discharged today after getting a dose of ertapenem and start his outpatient therapy from Saturday.   AKI (acute kidney injury) (HCC) Patient with significant protein urea and AKI which is new from his baseline.  Worsening renal function despite getting IV fluid.  Concern of NSAID induced renal injury per nephrology.  Elevated protein creatinine ratio.  ANA normal, complement C3 and C4 mildly elevated, ANCA negative. Renal function much improved. Advised to avoid all NSAID Avoid nephrotoxins   Metabolic acidosis Improved, likely secondary to renal disease. Discontinued bicarb tablets.   Hypokalemia Resolved. Potassium at 2.3 on admission ,  Likely secondary to GI losses with diarrhea.  Diarrhea Resolved. Watery diarrhea for the past 2 days.  No recent travel. C. difficile colitis PCR and GI pathogen panel negative Supportive care and hydration   History of inflammatory arthritis Patient was diagnosed with inflammatory arthritis in June 2023 and was evaluated by ID at that time.  He was recommended to follow-up with rheumatology for further workup, apparently did not follow-up with rheumatology. Prior workup done last year was negative Pending ANA, ANCA, hep C, C3 and C4 labs Need rheumatologic workup especially now he has significant proteinuria   Bilateral femoral head necrosis (HCC) Patient was found to have bilateral femoral head necrosis on MRI done during prior admission in  2023. Also has significant bilateral knee pain. Outpatient follow-up with orthopedic surgery   Hypernatremia Patient developed mild hypernatremia with  sodium at 146.  Likely due to auto diuresis with improving renal function. Sodium improved.   Back pain Patient with new onset back pain involving mid to lower back, along with tenderness. Has positive blood cultures. MRI of thoracic and lumbar spine with some disc protrusions but negative for osteo or discitis       Consultants: ID, nephrology Procedures performed: none  Disposition: Home Diet recommendation:  Discharge Diet Orders (From admission, onward)     Start     Ordered   08/10/22 0000  Diet - low sodium heart healthy        08/10/22 1122           Cardiac diet DISCHARGE MEDICATION: Allergies as of 08/10/2022   No Known Allergies      Medication List     STOP taking these medications    ibuprofen 800 MG tablet Commonly known as: ADVIL   lidocaine 5 % Commonly known as: LIDODERM   oxyCODONE-acetaminophen 5-325 MG tablet Commonly known as: Percocet   pantoprazole 40 MG tablet Commonly known as: PROTONIX   prednisoLONE 5 MG Tabs tablet       TAKE these medications    acetaminophen 500 MG tablet Commonly known as: TYLENOL Take 500 mg by mouth every 6 (six) hours as needed for mild pain.   allopurinol 300 MG tablet Commonly known as: ZYLOPRIM Take 300 mg by mouth daily.   atorvastatin 40 MG tablet Commonly known as: LIPITOR Take 40 mg by mouth daily.   ertapenem 1,000 mg in sodium chloride 0.9 % 100 mL Inject 1,000 mg into the vein daily for 2 days. At outpatient infusion center. Start taking on: August 11, 2022        Discharge Exam: Ceasar Mons Weights   08/03/22 0949  Weight: 77.1 kg   General -young Hispanic male, no apparent distress HEENT - PERRLA, EOMI, atraumatic head, non tender sinuses. Lung - Clear, rales, rhonchi, wheezes. Heart - S1, S2 heard, no murmurs, rubs, trace pedal edema Neuro - Alert, awake and oriented x 3, non focal exam. Skin - Warm and dry.  Condition at discharge: stable  The results of significant  diagnostics from this hospitalization (including imaging, microbiology, ancillary and laboratory) are listed below for reference.   Imaging Studies: [place] EKG SITE RITE  Result Date: 08/09/2022 If Site Rite image not attached, placement could not be confirmed due to current cardiac rhythm.  MR LUMBAR SPINE WO CONTRAST  Result Date: 08/07/2022 CLINICAL DATA:  Initial evaluation for mid and lower back pain, history of bacteremia, concern for infection. EXAM: MRI THORACIC AND LUMBAR SPINE WITHOUT CONTRAST TECHNIQUE: Multiplanar and multiecho pulse sequences of the thoracic and lumbar spine were obtained without intravenous contrast. COMPARISON:  Prior study from 09/28/2021. FINDINGS: MRI THORACIC SPINE FINDINGS Alignment: Mild straightening of the normal mid and lower thoracic kyphosis. No listhesis. Vertebrae: Vertebral body height maintained without acute or chronic fracture. Bone marrow signal intensity within normal limits. No worrisome osseous lesions. No imaging findings to suggest osteomyelitis discitis or septic arthritis. Cord:  Normal signal and morphology.  No epidural collections. Paraspinal and other soft tissues: Paraspinous soft tissues demonstrate no acute inflammatory changes. No soft tissue collections. Trace layering bilateral pleural effusions noted. Disc levels: No significant disc pathology seen within the thoracic spine for age. No focal disc herniation. No stenosis or impingement. MRI LUMBAR SPINE FINDINGS  Segmentation:  Examination degraded by motion artifact. Standard segmentation. Lowest well-formed disc space labeled the L5-S1 level. Alignment: Straightening of the normal lumbar lordosis. No significant listhesis. Vertebrae: Vertebral body height maintained without acute or chronic fracture. Bone marrow signal intensity within normal limits. No worrisome osseous lesions. No evidence for osteomyelitis discitis or septic arthritis. Conus medullaris and cauda equina: Conus extends to the  T12 level. Conus and cauda equina appear normal. No epidural collections. Paraspinal and other soft tissues: Paraspinous soft tissues demonstrate no acute finding. Disc levels: L1-2:  Unremarkable. L2-3:  Unremarkable. L3-4: Normal interspace. Mild bilateral facet hypertrophy. No stenosis. L4-5: Disc desiccation with mild circumferential disc bulge. Superimposed small right foraminal disc protrusion closely approximates the exiting right L4 nerve root. Mild facet hypertrophy. No spinal stenosis. Mild right L4 foraminal narrowing. Left neural foramen remains patent. L5-S1: Disc desiccation with diffuse disc bulge. Superimposed small central disc protrusion closely approximates the descending S1 nerve roots without frank neural impingement or displacement. Mild facet hypertrophy. No canal or lateral recess stenosis. Mild right L5 foraminal narrowing. Left neural foramina remains patent. IMPRESSION: MRI THORACIC SPINE IMPRESSION: 1. No evidence for acute infection within the thoracic spine. 2. No significant disc pathology, stenosis, or evidence for neural impingement. MRI LUMBAR SPINE IMPRESSION: 1. No evidence for acute infection within the lumbar spine. 2. Small right foraminal disc protrusion at L4-5, potentially affecting the exiting right L4 nerve root. 3. Small central disc protrusion at L5-S1, closely approximating the descending S1 nerve roots without frank neural impingement. Electronically Signed   By: Rise Mu M.D.   On: 08/07/2022 18:43   MR THORACIC SPINE WO CONTRAST  Result Date: 08/07/2022 CLINICAL DATA:  Initial evaluation for mid and lower back pain, history of bacteremia, concern for infection. EXAM: MRI THORACIC AND LUMBAR SPINE WITHOUT CONTRAST TECHNIQUE: Multiplanar and multiecho pulse sequences of the thoracic and lumbar spine were obtained without intravenous contrast. COMPARISON:  Prior study from 09/28/2021. FINDINGS: MRI THORACIC SPINE FINDINGS Alignment: Mild straightening of  the normal mid and lower thoracic kyphosis. No listhesis. Vertebrae: Vertebral body height maintained without acute or chronic fracture. Bone marrow signal intensity within normal limits. No worrisome osseous lesions. No imaging findings to suggest osteomyelitis discitis or septic arthritis. Cord:  Normal signal and morphology.  No epidural collections. Paraspinal and other soft tissues: Paraspinous soft tissues demonstrate no acute inflammatory changes. No soft tissue collections. Trace layering bilateral pleural effusions noted. Disc levels: No significant disc pathology seen within the thoracic spine for age. No focal disc herniation. No stenosis or impingement. MRI LUMBAR SPINE FINDINGS Segmentation:  Examination degraded by motion artifact. Standard segmentation. Lowest well-formed disc space labeled the L5-S1 level. Alignment: Straightening of the normal lumbar lordosis. No significant listhesis. Vertebrae: Vertebral body height maintained without acute or chronic fracture. Bone marrow signal intensity within normal limits. No worrisome osseous lesions. No evidence for osteomyelitis discitis or septic arthritis. Conus medullaris and cauda equina: Conus extends to the T12 level. Conus and cauda equina appear normal. No epidural collections. Paraspinal and other soft tissues: Paraspinous soft tissues demonstrate no acute finding. Disc levels: L1-2:  Unremarkable. L2-3:  Unremarkable. L3-4: Normal interspace. Mild bilateral facet hypertrophy. No stenosis. L4-5: Disc desiccation with mild circumferential disc bulge. Superimposed small right foraminal disc protrusion closely approximates the exiting right L4 nerve root. Mild facet hypertrophy. No spinal stenosis. Mild right L4 foraminal narrowing. Left neural foramen remains patent. L5-S1: Disc desiccation with diffuse disc bulge. Superimposed small central disc protrusion closely approximates  the descending S1 nerve roots without frank neural impingement or  displacement. Mild facet hypertrophy. No canal or lateral recess stenosis. Mild right L5 foraminal narrowing. Left neural foramina remains patent. IMPRESSION: MRI THORACIC SPINE IMPRESSION: 1. No evidence for acute infection within the thoracic spine. 2. No significant disc pathology, stenosis, or evidence for neural impingement. MRI LUMBAR SPINE IMPRESSION: 1. No evidence for acute infection within the lumbar spine. 2. Small right foraminal disc protrusion at L4-5, potentially affecting the exiting right L4 nerve root. 3. Small central disc protrusion at L5-S1, closely approximating the descending S1 nerve roots without frank neural impingement. Electronically Signed   By: Rise Mu M.D.   On: 08/07/2022 18:43   ECHOCARDIOGRAM COMPLETE  Result Date: 08/06/2022    ECHOCARDIOGRAM REPORT   Patient Name:   Mason Russell Date of Exam: 08/06/2022 Medical Rec #:  [id number]       Height:       65.0 in Accession #:    [id number]      Weight:       170.0 lb Date of Birth:  April 15, 1985       BSA:          1.846 m Patient Age:    36 years        BP:           128/94 mmHg Patient Gender: M               HR:           98 bpm. Exam Location:  [facility] Procedure: 2D Echo, Color Doppler and Cardiac Doppler Indications:     bacteremia R78.81  History:         Patient has no prior history of Echocardiogram examinations.  Sonographer:     Cristela Blue Referring Phys:  [id number] [facility] AMIN Diagnosing Phys: Lorine Bears MD IMPRESSIONS  1. Left ventricular ejection fraction, by estimation, is 55 to 60%. The left ventricle has normal function. The left ventricle has no regional wall motion abnormalities. Left ventricular diastolic parameters were normal.  2. Right ventricular systolic function is normal. The right ventricular size is normal. Tricuspid regurgitation signal is inadequate for assessing PA pressure.  3. The mitral valve is normal in structure. No evidence of mitral valve regurgitation. No evidence of mitral  stenosis.  4. The aortic valve is normal in structure. Aortic valve regurgitation is not visualized. No aortic stenosis is present. Conclusion(s)/Recommendation(s): No evidence of valvular vegetations on this transthoracic echocardiogram. FINDINGS  Left Ventricle: Left ventricular ejection fraction, by estimation, is 55 to 60%. The left ventricle has normal function. The left ventricle has no regional wall motion abnormalities. The left ventricular internal cavity size was normal in size. There is  no left ventricular hypertrophy. Left ventricular diastolic parameters were normal. Right Ventricle: The right ventricular size is normal. No increase in right ventricular wall thickness. Right ventricular systolic function is normal. Tricuspid regurgitation signal is inadequate for assessing PA pressure. Left Atrium: Left atrial size was normal in size. Right Atrium: Right atrial size was normal in size. Pericardium: There is no evidence of pericardial effusion. Mitral Valve: The mitral valve is normal in structure. No evidence of mitral valve regurgitation. No evidence of mitral valve stenosis. Tricuspid Valve: The tricuspid valve is normal in structure. Tricuspid valve regurgitation is trivial. No evidence of tricuspid stenosis. Aortic Valve: The aortic valve is normal in structure. Aortic valve regurgitation is not visualized. No aortic stenosis is present. Aortic valve mean gradient  measures 2.0 mmHg. Aortic valve peak gradient measures 3.4 mmHg. Aortic valve area, by VTI measures 2.19 cm. Pulmonic Valve: The pulmonic valve was normal in structure. Pulmonic valve regurgitation is not visualized. No evidence of pulmonic stenosis. Aorta: The aortic root is normal in size and structure. Venous: The inferior vena cava was not well visualized. IAS/Shunts: No atrial level shunt detected by color flow Doppler.  LEFT VENTRICLE PLAX 2D LVIDd:         4.70 cm   Diastology LVIDs:         3.40 cm   LV e' medial:    10.20 cm/s  LV PW:         1.50 cm   LV E/e' medial:  6.3 LV IVS:        1.00 cm   LV e' lateral:   12.70 cm/s LVOT diam:     2.10 cm   LV E/e' lateral: 5.0 LV SV:         39 LV SV Index:   21 LVOT Area:     3.46 cm  RIGHT VENTRICLE RV Basal diam:  3.90 cm RV Mid diam:    3.10 cm RV S prime:     16.00 cm/s LEFT ATRIUM             Index        RIGHT ATRIUM           Index LA diam:        4.30 cm 2.33 cm/m   RA Area:     13.30 cm LA Vol (A2C):   47.3 ml 25.62 ml/m  RA Volume:   29.10 ml  15.76 ml/m LA Vol (A4C):   29.9 ml 16.20 ml/m LA Biplane Vol: 40.3 ml 21.83 ml/m  AORTIC VALVE AV Area (Vmax):    2.52 cm AV Area (Vmean):   2.31 cm AV Area (VTI):     2.19 cm AV Vmax:           92.40 cm/s AV Vmean:          69.250 cm/s AV VTI:            0.179 m AV Peak Grad:      3.4 mmHg AV Mean Grad:      2.0 mmHg LVOT Vmax:         67.10 cm/s LVOT Vmean:        46.200 cm/s LVOT VTI:          0.113 m LVOT/AV VTI ratio: 0.63  AORTA Ao Root diam: 3.00 cm MITRAL VALVE               TRICUSPID VALVE MV Area (PHT): 3.72 cm    TR Peak grad:   23.4 mmHg MV Decel Time: 204 msec    TR Vmax:        242.00 cm/s MV E velocity: 63.90 cm/s MV A velocity: 60.30 cm/s  SHUNTS MV E/A ratio:  1.06        Systemic VTI:  0.11 m                            Systemic Diam: 2.10 cm Lorine Bears MD Electronically signed by Lorine Bears MD Signature Date/Time: 08/06/2022/12:28:39 PM    Final    US Abdomen Limited RUQ (LIVER/GB)  Result Date: 08/05/2022 CLINICAL DATA:  84132 Abdominal distention [id number] EXAM: ULTRASOUND ABDOMEN LIMITED RIGHT UPPER QUADRANT COMPARISON:  August 03, 2022 FINDINGS: Gallbladder: Layering sludge. No wall thickening. No sonographic Murphy sign noted by sonographer. Common bile duct: Diameter: Visualized portion measures 9 mm, dilated. This is increased since recent CT. Liver: No focal lesion identified. Diffusely increased in parenchymal echogenicity. Portal vein is patent on color Doppler imaging with normal direction of blood  flow towards the liver. Other: None. IMPRESSION: 1. The common bile duct is dilated measuring up to 9 mm. Recommend correlation with LFTs. If concern for biliary obstruction, recommend dedicated hepatic MRI with MRCP. 2. Hepatic steatosis. 3. Biliary sludge. Electronically Signed   By: Meda Klinefelter M.D.   On: 08/05/2022 14:59   CT ABDOMEN PELVIS WO CONTRAST  Result Date: 08/03/2022 CLINICAL DATA:  Right lower quadrant abdominal pain EXAM: CT ABDOMEN AND PELVIS WITHOUT CONTRAST TECHNIQUE: Multidetector CT imaging of the abdomen and pelvis was performed following the standard protocol without IV contrast. RADIATION DOSE REDUCTION: This exam was performed according to the departmental dose-optimization program which includes automated exposure control, adjustment of the mA and/or kV according to patient size and/or use of iterative reconstruction technique. COMPARISON:  None Available. FINDINGS: Lower chest: There is some linear opacity seen along bases likely scar or atelectasis. No pleural effusion. Hepatobiliary: Diffuse fatty liver infiltration identified. There is some areas of sparing along the margin of the gallbladder. Gallbladder itself is nondilated. Pancreas: Unremarkable. No pancreatic ductal dilatation or surrounding inflammatory changes. Spleen: Normal in size without focal abnormality. Adrenals/Urinary Tract: The adrenal glands are preserved. Both kidneys appears slightly enlarged and there is some perinephric stranding. No abnormal calcification seen within either kidney nor along the course of either ureter. The bladder is underdistended. Stomach/Bowel: Large bowel on this non oral contrast examination has a normal course and caliber. Mild stool. Normal appendix extends inferior to the cecum in the right lower quadrant. The stomach is underdistended. Small bowel is nondilated. Vascular/Lymphatic: No significant vascular findings are present. No enlarged abdominal or pelvic lymph nodes.  Reproductive: Prostate is unremarkable. Other: No abdominal wall hernia or abnormality. No abdominopelvic ascites. Musculoskeletal: No acute or significant osseous findings. IMPRESSION: No obstructing renal stone. Of note the kidneys appear somewhat enlarged and there is some perinephric stranding. This has a differential including an infectious or inflammatory process. If there is concern of pyelonephritis or other process process and IV contrast study may be useful to further delineate. Fatty liver infiltration. Normal appendix. Electronically Signed   By: Karen Kays M.D.   On: 08/03/2022 11:33   DG Chest 2 View  Result Date: 08/03/2022 CLINICAL DATA:  sob, eval for pna EXAM: CHEST - 2 VIEW COMPARISON:  CXR 06/24/21 FINDINGS: No pleural effusion. No pneumothorax. Low lung volumes. Likely normal cardiac and mediastinal contours when accounting for low lung volumes. No focal airspace opacity. No radiographically apparent displaced rib fractures. Visualized upper abdomen is unremarkable. Vertebral body heights are maintained IMPRESSION: No focal airspace opacity. Electronically Signed   By: Lorenza Cambridge M.D.   On: 08/03/2022 10:30    Microbiology: Results for orders placed or performed during the hospital encounter of 08/03/22  Urine Culture (for pregnant, neutropenic or urologic patients or patients with an indwelling urinary catheter)     Status: Abnormal   Collection Time: 08/03/22 10:09 AM   Specimen: Urine, Clean Catch  Result Value Ref Range Status   Specimen Description   Final    URINE, CLEAN CATCH Performed at [facility], [address], [place], [place] [address]    Special Requests   Final  NONE Performed at [facility], [address] [place], [place] [address]    Culture (A)  Final    >=100,000 COLONIES/mL ESCHERICHIA COLI Confirmed Extended Spectrum Beta-Lactamase Producer (ESBL).  In bloodstream infections from ESBL organisms, carbapenems are preferred  over piperacillin/tazobactam. They are shown to have a lower risk of mortality.    Report Status 08/05/2022 FINAL  Final   Organism ID, Bacteria ESCHERICHIA COLI (A)  Final      Susceptibility   Escherichia coli - MIC*    AMPICILLIN >=32 RESISTANT Resistant     CEFAZOLIN >=64 RESISTANT Resistant     CEFEPIME 16 RESISTANT Resistant     CEFTRIAXONE >=64 RESISTANT Resistant     CIPROFLOXACIN >=4 RESISTANT Resistant     GENTAMICIN >=16 RESISTANT Resistant     IMIPENEM <=0.25 SENSITIVE Sensitive     NITROFURANTOIN <=16 SENSITIVE Sensitive     TRIMETH/SULFA <=20 SENSITIVE Sensitive     AMPICILLIN/SULBACTAM >=32 RESISTANT Resistant     PIP/TAZO 16 SENSITIVE Sensitive     * >=100,000 COLONIES/mL ESCHERICHIA COLI  Resp panel by RT-PCR (RSV, Flu A&B, Covid) Anterior Nasal Swab     Status: None   Collection Time: 08/03/22 10:17 AM   Specimen: Anterior Nasal Swab  Result Value Ref Range Status   SARS Coronavirus 2 by RT PCR NEGATIVE NEGATIVE Final    Comment: (NOTE) SARS-CoV-2 target nucleic acids are NOT DETECTED.  The SARS-CoV-2 RNA is generally detectable in upper respiratory specimens during the acute phase of infection. The lowest concentration of SARS-CoV-2 viral copies this assay can detect is 138 copies/mL. A negative result does not preclude SARS-Cov-2 infection and should not be used as the sole basis for treatment or other patient management decisions. A negative result may occur with  improper specimen collection/handling, submission of specimen other than nasopharyngeal swab, presence of viral mutation(s) within the areas targeted by this assay, and inadequate number of viral copies(<138 copies/mL). A negative result must be combined with clinical observations, patient history, and epidemiological information. The expected result is Negative.  Fact Sheet for Patients:  BloggerCourse.com  Fact Sheet for Healthcare Providers:   SeriousBroker.it  This test is no t yet approved or cleared by the [place] FDA and  has been authorized for detection and/or diagnosis of SARS-CoV-2 by FDA under an Emergency Use Authorization (EUA). This EUA will remain  in effect (meaning this test can be used) for the duration of the COVID-19 declaration under Section 564(b)(1) of the Act, 21 U.S.C.section 360bbb-3(b)(1), unless the authorization is terminated  or revoked sooner.       Influenza A by PCR NEGATIVE NEGATIVE Final   Influenza B by PCR NEGATIVE NEGATIVE Final    Comment: (NOTE) The Xpert Xpress SARS-CoV-2/FLU/RSV plus assay is intended as an aid in the diagnosis of influenza from Nasopharyngeal swab specimens and should not be used as a sole basis for treatment. Nasal washings and aspirates are unacceptable for Xpert Xpress SARS-CoV-2/FLU/RSV testing.  Fact Sheet for Patients: BloggerCourse.com  Fact Sheet for Healthcare Providers: SeriousBroker.it  This test is not yet approved or cleared by the [place] FDA and has been authorized for detection and/or diagnosis of SARS-CoV-2 by FDA under an Emergency Use Authorization (EUA). This EUA will remain in effect (meaning this test can be used) for the duration of the COVID-19 declaration under Section 564(b)(1) of the Act, 21 U.S.C. section 360bbb-3(b)(1), unless the authorization is terminated or revoked.     Resp Syncytial Virus by PCR NEGATIVE  NEGATIVE Final    Comment: (NOTE) Fact Sheet for Patients: BloggerCourse.com  Fact Sheet for Healthcare Providers: SeriousBroker.it  This test is not yet approved or cleared by the [place] FDA and has been authorized for detection and/or diagnosis of SARS-CoV-2 by FDA under an Emergency Use Authorization (EUA). This EUA will remain in effect (meaning this test can be used) for  the duration of the COVID-19 declaration under Section 564(b)(1) of the Act, 21 U.S.C. section 360bbb-3(b)(1), unless the authorization is terminated or revoked.  Performed at [facility], [address] [place], [place] [address]   Blood culture (routine x 2)     Status: Abnormal   Collection Time: 08/03/22 10:47 AM   Specimen: BLOOD  Result Value Ref Range Status   Specimen Description   Final    BLOOD RIGHT ANTECUBITAL Performed at [facility], [address] [place], [place] [address]    Special Requests   Final    BOTTLES DRAWN AEROBIC AND ANAEROBIC Blood Culture adequate volume Performed at [facility], [address] [place], [place] [address]    Culture  Setup Time   Final    IN BOTH AEROBIC AND ANAEROBIC BOTTLES GRAM NEGATIVE RODS CRITICAL VALUE NOTED.  VALUE IS CONSISTENT WITH PREVIOUSLY REPORTED AND CALLED VALUE. Performed at [facility], [address] [place], [place] [address]    Culture (A)  Final    ESCHERICHIA COLI SUSCEPTIBILITIES PERFORMED ON PREVIOUS CULTURE WITHIN THE LAST 5 DAYS. Performed at [facility], [address], [place], [place] [address]    Report Status 08/07/2022 FINAL  Final  Blood culture (routine x 2)     Status: Abnormal   Collection Time: 08/03/22 10:52 AM   Specimen: BLOOD LEFT ARM  Result Value Ref Range Status   Specimen Description   Final    BLOOD LEFT ARM Performed at [facility], [address], [place], [place] [address]    Special Requests   Final    BOTTLES DRAWN AEROBIC AND ANAEROBIC Blood Culture adequate volume Performed at [facility], [address] [place], [place] [address]    Culture  Setup Time   Final    GRAM NEGATIVE RODS AEROBIC BOTTLE ONLY CRITICAL RESULT CALLED TO, READ BACK BY AND VERIFIED WITH: ALEX CHAPPELL ON 08/04/22 AT 0802 QSD    Culture (A)  Final    ESCHERICHIA COLI Confirmed Extended Spectrum Beta-Lactamase Producer  (ESBL).  In bloodstream infections from ESBL organisms, carbapenems are preferred over piperacillin/tazobactam. They are shown to have a lower risk of mortality.    Report Status 08/06/2022 FINAL  Final   Organism ID, Bacteria ESCHERICHIA COLI  Final      Susceptibility   Escherichia coli - MIC*    AMPICILLIN >=32 RESISTANT Resistant     CEFEPIME 16 RESISTANT Resistant     CEFTAZIDIME RESISTANT Resistant     CEFTRIAXONE >=64 RESISTANT Resistant     CIPROFLOXACIN >=4 RESISTANT Resistant     GENTAMICIN >=16 RESISTANT Resistant     IMIPENEM <=0.25 SENSITIVE Sensitive     TRIMETH/SULFA <=20 SENSITIVE Sensitive     AMPICILLIN/SULBACTAM >=32 RESISTANT Resistant     PIP/TAZO 16 SENSITIVE Sensitive     * ESCHERICHIA COLI  Blood Culture ID Panel (Reflexed)     Status: Abnormal   Collection Time: 08/03/22 10:52 AM  Result Value Ref Range Status   Enterococcus faecalis NOT DETECTED NOT DETECTED Final   Enterococcus Faecium NOT DETECTED NOT  DETECTED Final   Listeria monocytogenes NOT DETECTED NOT DETECTED Final   Staphylococcus species NOT DETECTED NOT DETECTED Final   Staphylococcus aureus (BCID) NOT DETECTED NOT DETECTED Final   Staphylococcus epidermidis NOT DETECTED NOT DETECTED Final   Staphylococcus lugdunensis NOT DETECTED NOT DETECTED Final   Streptococcus species NOT DETECTED NOT DETECTED Final   Streptococcus agalactiae NOT DETECTED NOT DETECTED Final   Streptococcus pneumoniae NOT DETECTED NOT DETECTED Final   Streptococcus pyogenes NOT DETECTED NOT DETECTED Final   A.calcoaceticus-baumannii NOT DETECTED NOT DETECTED Final   Bacteroides fragilis NOT DETECTED NOT DETECTED Final   Enterobacterales DETECTED (A) NOT DETECTED Final    Comment: Enterobacterales represent a large order of gram negative bacteria, not a single organism. CRITICAL RESULT CALLED TO, READ BACK BY AND VERIFIED WITH: ALEX CHAPPELL ON 08/04/22 AT 0802 QSD    Enterobacter cloacae complex NOT DETECTED NOT DETECTED  Final   Escherichia coli DETECTED (A) NOT DETECTED Final    Comment: CRITICAL RESULT CALLED TO, READ BACK BY AND VERIFIED WITH: ALEX CHAPPELL ON 08/04/22 AT 0802 QSD    Klebsiella aerogenes NOT DETECTED NOT DETECTED Final   Klebsiella oxytoca NOT DETECTED NOT DETECTED Final   Klebsiella pneumoniae NOT DETECTED NOT DETECTED Final   Proteus species NOT DETECTED NOT DETECTED Final   Salmonella species NOT DETECTED NOT DETECTED Final   Serratia marcescens NOT DETECTED NOT DETECTED Final   Haemophilus influenzae NOT DETECTED NOT DETECTED Final   Neisseria meningitidis NOT DETECTED NOT DETECTED Final   Pseudomonas aeruginosa NOT DETECTED NOT DETECTED Final   Stenotrophomonas maltophilia NOT DETECTED NOT DETECTED Final   Candida albicans NOT DETECTED NOT DETECTED Final   Candida auris NOT DETECTED NOT DETECTED Final   Candida glabrata NOT DETECTED NOT DETECTED Final   Candida krusei NOT DETECTED NOT DETECTED Final   Candida parapsilosis NOT DETECTED NOT DETECTED Final   Candida tropicalis NOT DETECTED NOT DETECTED Final   Cryptococcus neoformans/gattii NOT DETECTED NOT DETECTED Final   CTX-M ESBL DETECTED (A) NOT DETECTED Final    Comment: CRITICAL RESULT CALLED TO, READ BACK BY AND VERIFIED WITH: ALEX CHAPPELL ON 08/04/22 AT 0802 QSD (NOTE) Extended spectrum beta-lactamase detected. Recommend a carbapenem as initial therapy.      Carbapenem resistance IMP NOT DETECTED NOT DETECTED Final   Carbapenem resistance KPC NOT DETECTED NOT DETECTED Final   Carbapenem resistance NDM NOT DETECTED NOT DETECTED Final   Carbapenem resist OXA 48 LIKE NOT DETECTED NOT DETECTED Final   Carbapenem resistance VIM NOT DETECTED NOT DETECTED Final    Comment: Performed at [facility], [address] [place], [place] [address]  Gastrointestinal Panel by PCR , Stool     Status: None   Collection Time: 08/03/22  4:30 PM   Specimen: STOOL  Result Value Ref Range Status   Campylobacter species NOT  DETECTED NOT DETECTED Final   Plesimonas shigelloides NOT DETECTED NOT DETECTED Final   Salmonella species NOT DETECTED NOT DETECTED Final   Yersinia enterocolitica NOT DETECTED NOT DETECTED Final   Vibrio species NOT DETECTED NOT DETECTED Final   Vibrio cholerae NOT DETECTED NOT DETECTED Final   Enteroaggregative E coli (EAEC) NOT DETECTED NOT DETECTED Final   Enteropathogenic E coli (EPEC) NOT DETECTED NOT DETECTED Final   Enterotoxigenic E coli (ETEC) NOT DETECTED NOT DETECTED Final   Shiga like toxin producing E coli (STEC) NOT DETECTED NOT DETECTED Final   Shigella/Enteroinvasive E coli (EIEC) NOT DETECTED NOT DETECTED Final   Cryptosporidium NOT DETECTED  NOT DETECTED Final   Cyclospora cayetanensis NOT DETECTED NOT DETECTED Final   Entamoeba histolytica NOT DETECTED NOT DETECTED Final   Giardia lamblia NOT DETECTED NOT DETECTED Final   Adenovirus F40/41 NOT DETECTED NOT DETECTED Final   Astrovirus NOT DETECTED NOT DETECTED Final   Norovirus GI/GII NOT DETECTED NOT DETECTED Final   Rotavirus A NOT DETECTED NOT DETECTED Final   Sapovirus (I, II, IV, and V) NOT DETECTED NOT DETECTED Final    Comment: Performed at [facility], [address] [place], [place] [address]  C Difficile Quick Screen w PCR reflex     Status: None   Collection Time: 08/03/22  4:30 PM   Specimen: STOOL  Result Value Ref Range Status   C Diff antigen NEGATIVE NEGATIVE Final   C Diff toxin NEGATIVE NEGATIVE Final   C Diff interpretation No C. difficile detected.  Final    Comment: Performed at [facility], [address] [place], [place] [address]  Culture, blood (Routine X 2) w Reflex to ID Panel     Status: None (Preliminary result)   Collection Time: 08/08/22  5:15 AM   Specimen: BLOOD RIGHT ARM  Result Value Ref Range Status   Specimen Description BLOOD RIGHT ARM  Final   Special Requests   Final    BOTTLES DRAWN AEROBIC AND ANAEROBIC Blood Culture adequate volume   Culture    Final    NO GROWTH 2 DAYS Performed at [facility], [address] [place], [place] [address]    Report Status PENDING  Incomplete  Culture, blood (Routine X 2) w Reflex to ID Panel     Status: None (Preliminary result)   Collection Time: 08/08/22  5:16 AM   Specimen: BLOOD RIGHT HAND  Result Value Ref Range Status   Specimen Description BLOOD RIGHT HAND  Final   Special Requests   Final    BOTTLES DRAWN AEROBIC AND ANAEROBIC Blood Culture results may not be optimal due to an excessive volume of blood received in culture bottles   Culture   Final    NO GROWTH 2 DAYS Performed at [facility], [address] [place], [place] [address]    Report Status PENDING  Incomplete    Labs: CBC: Recent Labs  Lab 08/04/22 0348 08/08/22 0515  WBC 16.4* 10.9*  HGB 12.6* 11.4*  HCT 38.1* 35.3*  MCV 89.0 91.0  PLT 228 679*   Basic Metabolic Panel: Recent Labs  Lab 08/05/22 0858 08/06/22 0346 08/07/22 0515 08/08/22 0515 08/09/22 0531  NA 137 139 144 143 146*  K 3.8 4.1 4.3 4.3 4.5  CL 105 108 112* 111 110  CO2 17* 20* 21* 25 24  GLUCOSE 110* 115* 126* 125* 118*  BUN 60* 55* 52* 39* 26*  CREATININE 6.29* 5.73* 3.90* 2.64* 1.71*  CALCIUM 8.2* 8.3* 8.0* 8.1* 8.2*  PHOS 2.7 3.3 4.0 4.0 3.8   Liver Function Tests: Recent Labs  Lab 08/05/22 0858 08/06/22 0346 08/07/22 0515 08/08/22 0515 08/09/22 0531  ALBUMIN 2.5* 2.4* 2.3* 2.5* 2.4*   CBG: Recent Labs  Lab 08/07/22 1139 08/07/22 1737 08/08/22 0727 08/09/22 0810 08/10/22 0803  GLUCAP 120* 131* 159* 106* 98    Discharge time spent: greater than 30 minutes.  Signed: Marcelino Duster, MD [facility] 08/10/2022

## 2022-08-11 ENCOUNTER — Ambulatory Visit
Admit: 2022-08-11 | Discharge: 2022-08-11 | Disposition: A | Discharge status: Home / Self Care | Payer: Self-pay | Attending: Internal Medicine | Admitting: Internal Medicine

## 2022-08-11 ENCOUNTER — Ambulatory Visit: Admit: 2022-08-11 | Payer: Self-pay

## 2022-08-11 MED ORDER — SODIUM CHLORIDE 0.9 % IV SOLN
1.0000 g | Freq: Once | INTRAVENOUS | Status: AC
  Administered 2022-08-11: 1000 mg via INTRAVENOUS
  Filled 2022-08-11: qty 1

## 2022-08-12 ENCOUNTER — Ambulatory Visit
Admit: 2022-08-12 | Discharge: 2022-08-12 | Disposition: A | Discharge status: Home / Self Care | Payer: Self-pay | Attending: Internal Medicine | Admitting: Internal Medicine

## 2022-08-12 VITALS — BP 125/95 | HR 81 | Resp 18

## 2022-08-12 DIAGNOSIS — A498 Other bacterial infections of unspecified site: Secondary | ICD-10-CM

## 2022-08-12 LAB — CBC WITH DIFFERENTIAL/PLATELET
Abs Immature Granulocytes: 0.12 10*3/uL — ABNORMAL HIGH (ref 0.00–0.07)
Basophils Absolute: 0 10*3/uL (ref 0.0–0.1)
Basophils Relative: 0 %
Eosinophils Absolute: 0.3 10*3/uL (ref 0.0–0.5)
Eosinophils Relative: 3 %
HCT: 35.5 % — ABNORMAL LOW (ref 39.0–52.0)
Hemoglobin: 11.7 g/dL — ABNORMAL LOW (ref 13.0–17.0)
Immature Granulocytes: 1 %
Lymphocytes Relative: 21 %
Lymphs Abs: 2 10*3/uL (ref 0.7–4.0)
MCH: 29.8 pg (ref 26.0–34.0)
MCHC: 33 g/dL (ref 30.0–36.0)
MCV: 90.3 fL (ref 80.0–100.0)
Monocytes Absolute: 0.5 10*3/uL (ref 0.1–1.0)
Monocytes Relative: 5 %
Neutro Abs: 6.7 10*3/uL (ref 1.7–7.7)
Neutrophils Relative %: 70 %
Platelets: 725 10*3/uL — ABNORMAL HIGH (ref 150–400)
RBC: 3.93 MIL/uL — ABNORMAL LOW (ref 4.22–5.81)
RDW: 13.6 % (ref 11.5–15.5)
WBC: 9.6 10*3/uL (ref 4.0–10.5)
nRBC: 0 % (ref 0.0–0.2)

## 2022-08-12 LAB — COMPREHENSIVE METABOLIC PANEL
ALT: 26 U/L (ref 0–44)
AST: 30 U/L (ref 15–41)
Albumin: 2.9 g/dL — ABNORMAL LOW (ref 3.5–5.0)
Alkaline Phosphatase: 72 U/L (ref 38–126)
Anion gap: 12 (ref 5–15)
BUN: 19 mg/dL (ref 6–20)
CO2: 22 mmol/L (ref 22–32)
Calcium: 8.6 mg/dL — ABNORMAL LOW (ref 8.9–10.3)
Chloride: 110 mmol/L (ref 98–111)
Creatinine, Ser: 1.01 mg/dL (ref 0.61–1.24)
GFR, Estimated: >60 mL/min (ref >60)
Glucose, Bld: 110 mg/dL — ABNORMAL HIGH (ref 70–99)
Potassium: 3.9 mmol/L (ref 3.5–5.1)
Sodium: 144 mmol/L (ref 135–145)
Total Bilirubin: 0.4 mg/dL (ref 0.3–1.2)
Total Protein: 7.7 g/dL (ref 6.5–8.1)

## 2022-08-12 MED ORDER — SODIUM CHLORIDE 0.9 % IV SOLN
1.0000 g | Freq: Once | INTRAVENOUS | Status: DC
  Filled 2022-08-12: qty 1

## 2022-08-13 ENCOUNTER — Ambulatory Visit
Admit: 2022-08-13 | Discharge: 2022-08-13 | Disposition: A | Discharge status: Home / Self Care | Payer: Self-pay | Attending: Internal Medicine | Admitting: Internal Medicine

## 2022-08-13 MED ORDER — SODIUM CHLORIDE 0.9 % IV SOLN
1.0000 g | Freq: Once | INTRAVENOUS | Status: AC
  Administered 2022-08-13: 1000 mg via INTRAVENOUS
  Filled 2022-08-13: qty 1

## 2022-08-16 ENCOUNTER — Telehealth: Payer: Self-pay

## 2022-08-16 NOTE — Telephone Encounter (Signed)
-----   Message from Lynn Ito sent at 08/15/2022  9:50 AM EDT ----- Can you please check to se ehow he is doing- He was getting ertapenem in the day surgery for ESBL ecoli bacteremia and UTI. I did not give him a follow up. If he is doing okay , he need not see me otherwise ( if he has fever or urine symptoms  we can make an appt) thx ----- Message ----- From: Interface, Lab In [place] Sent: 08/12/2022   8:45 AM EDT To: Lynn Ito, MD

## 2022-08-24 ENCOUNTER — Telehealth: Payer: Self-pay

## 2022-08-24 NOTE — Telephone Encounter (Signed)
-----   Message from Lynn Ito sent at 08/15/2022  9:50 AM EDT ----- Can you please check to se ehow he is doing- He was getting ertapenem in the day surgery for ESBL ecoli bacteremia and UTI. I did not give him a follow up. If he is doing okay , he need not see me otherwise ( if he has fever or urine symptoms  we can make an appt) thx ----- Message ----- From: Interface, Lab In [place] Sent: 08/12/2022   8:45 AM EDT To: Lynn Ito, MD

## 2022-08-24 NOTE — Telephone Encounter (Signed)
Multiple attempts made to reach the patient. Patient has not answered or returned phone call.  Mychart message also sent to the patient.  Mason Russell T Pricilla Loveless

## 2023-09-27 IMAGING — DX DG FOOT 2V*R*
2 series · 2 of 2 positions shown · non-contrast
Comparison: None Available.

CLINICAL DATA: Foot pain and swelling.

EXAM:
RIGHT FOOT - 2 VIEW

[foot ap]
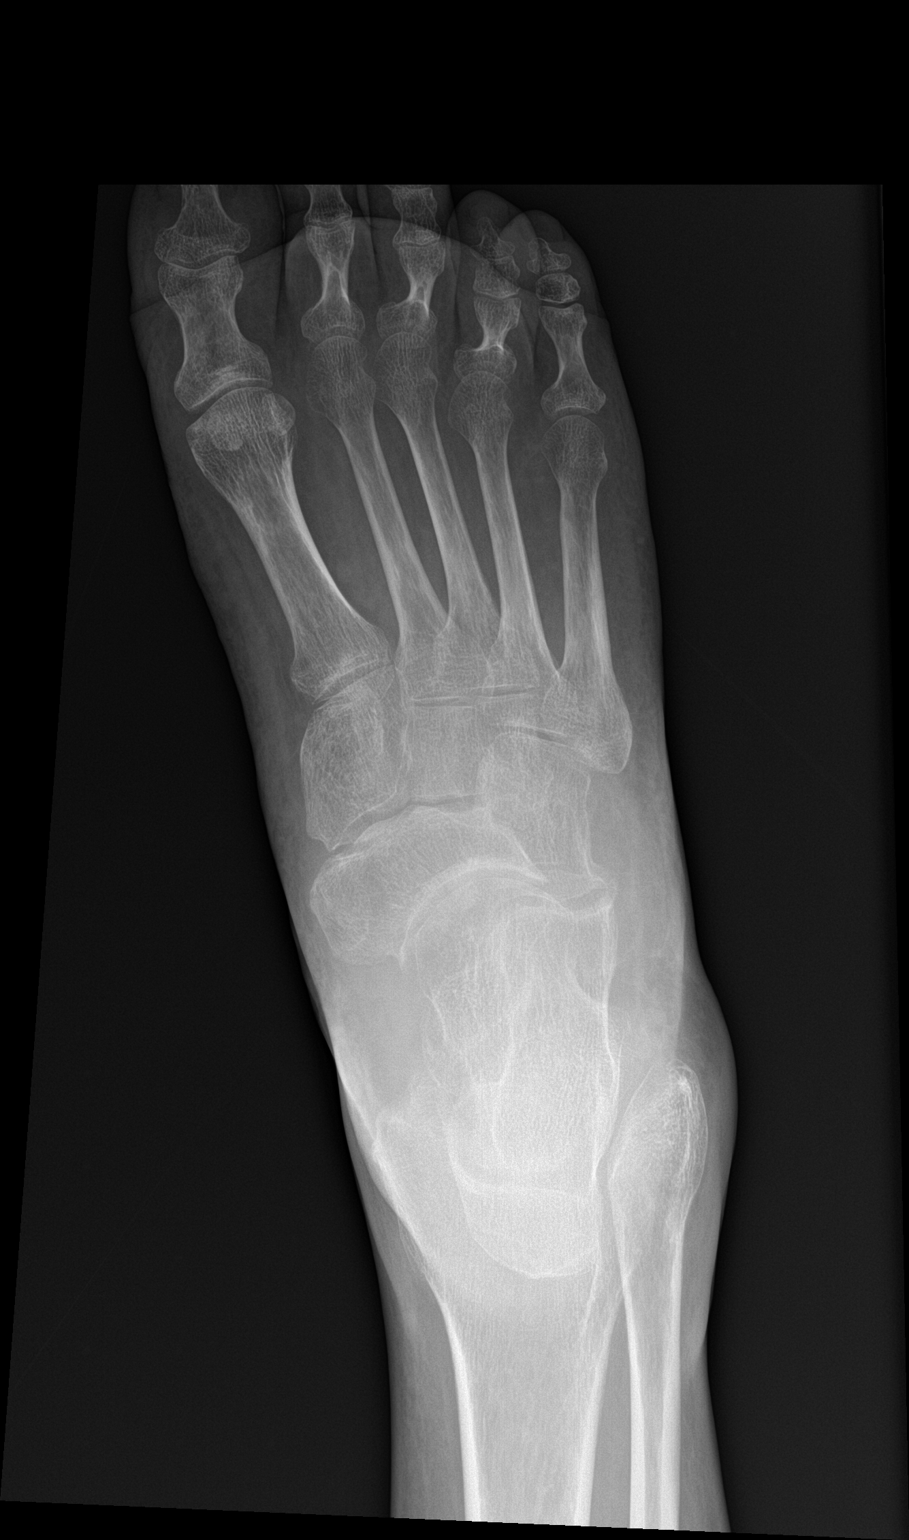

[foot lat]
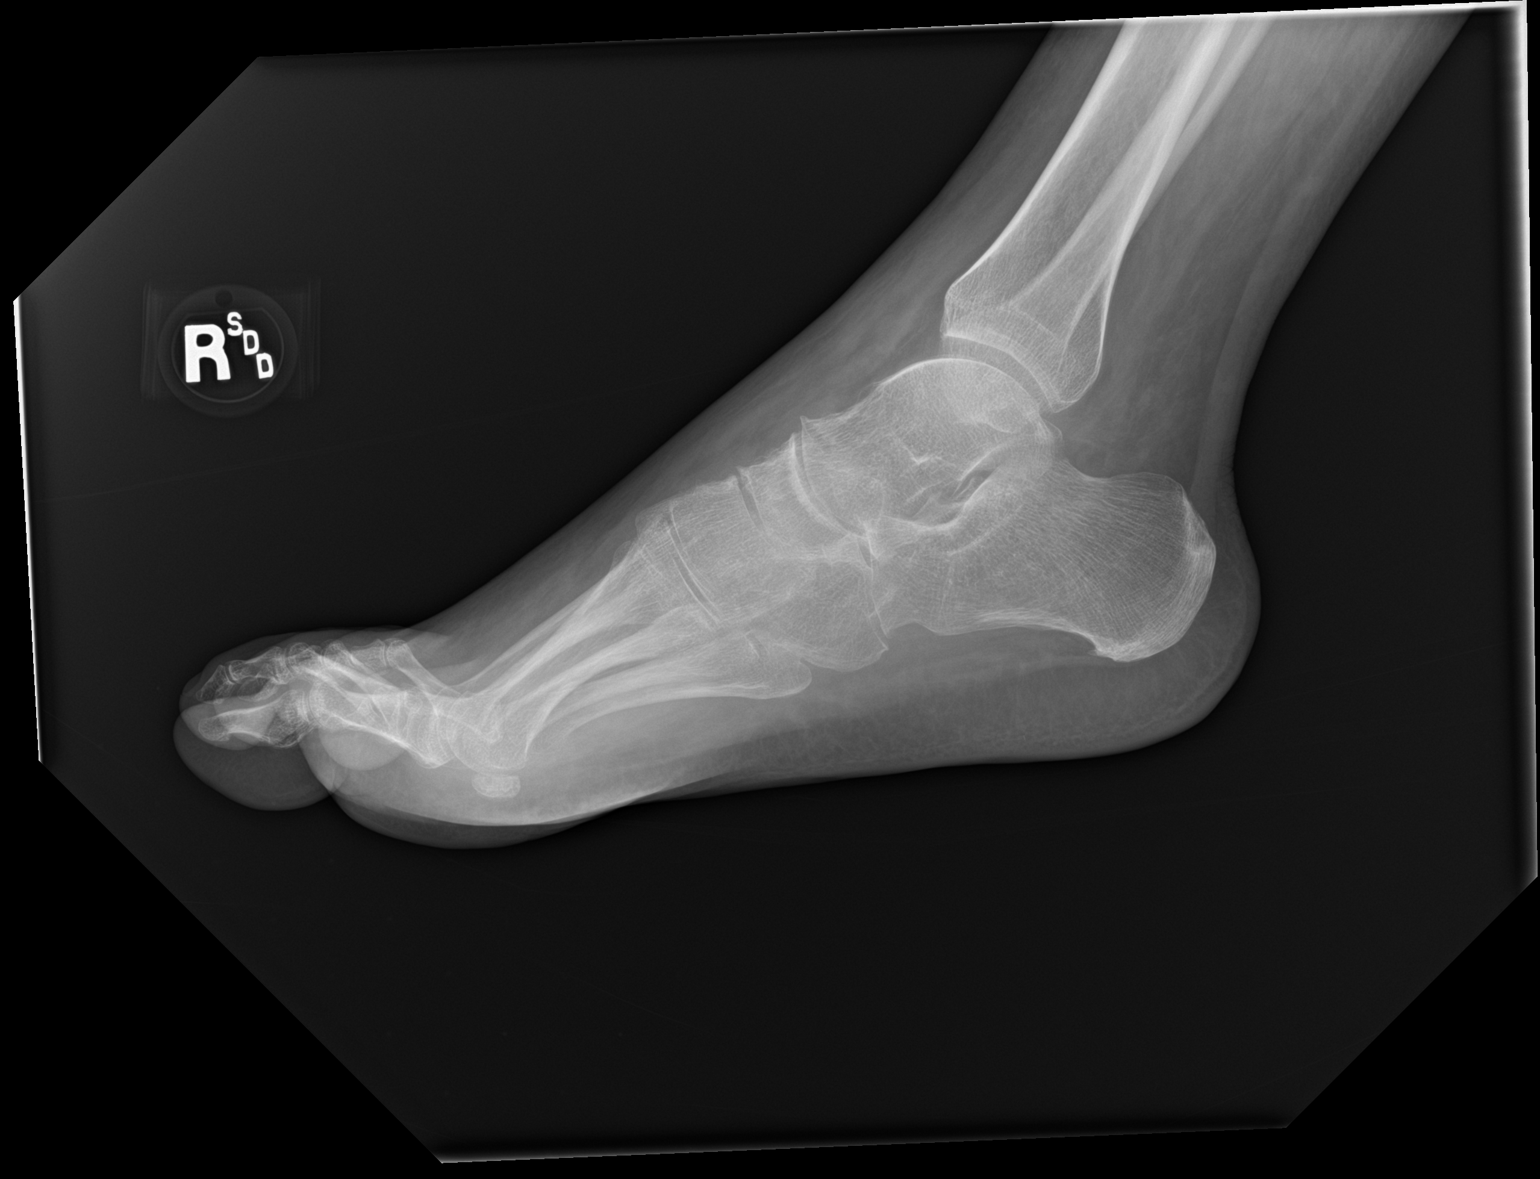

[2 of 2 positions shown; findings below may reference images not displayed]

FINDINGS: There is no evidence of fracture or dislocation. Mild degenerative
changes are seen along the dorsal aspect of the mid right foot. Mild
soft tissue swelling is seen along the dorsal aspect of the right
foot.
IMPRESSION: Mild degenerative changes and soft tissue swelling without evidence
of acute osseous abnormality.

## 2023-09-27 IMAGING — DX DG FOOT 2V*L*
2 series · 2 of 2 positions shown · non-contrast
Comparison: None Available.

CLINICAL DATA: Bilateral foot pain

EXAM:
LEFT FOOT - 2 VIEW

[foot ap]
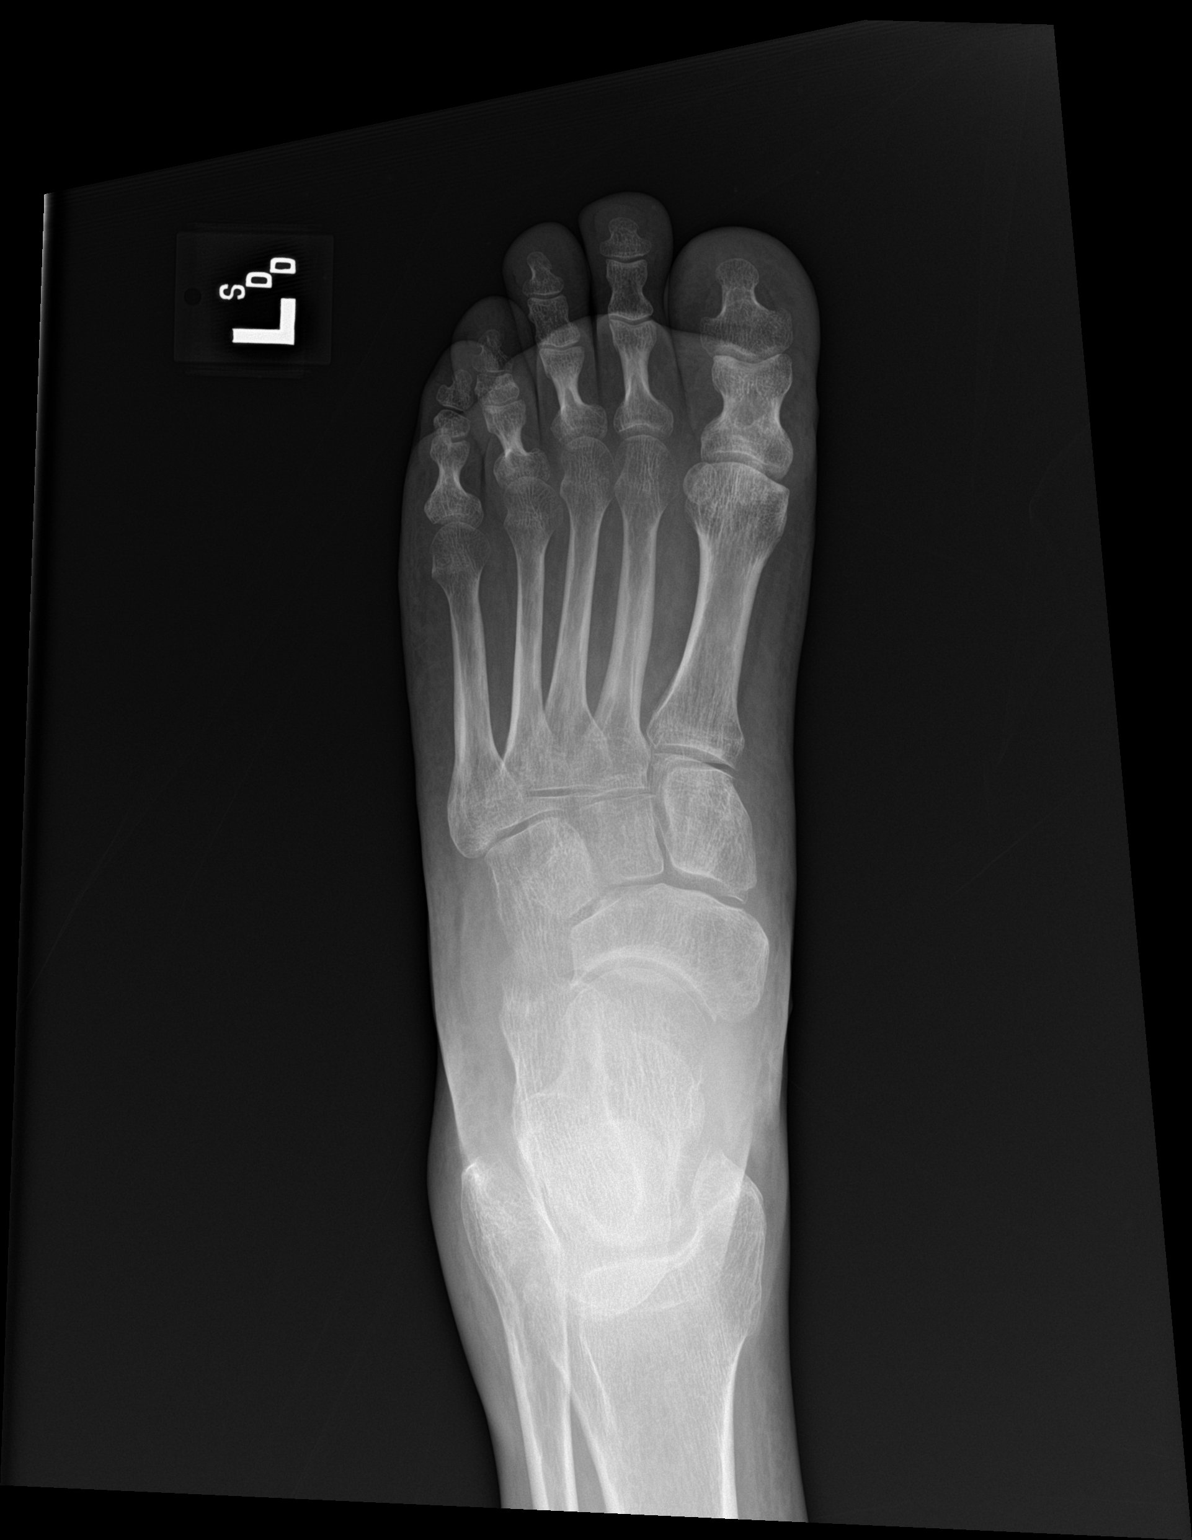

[foot lat]
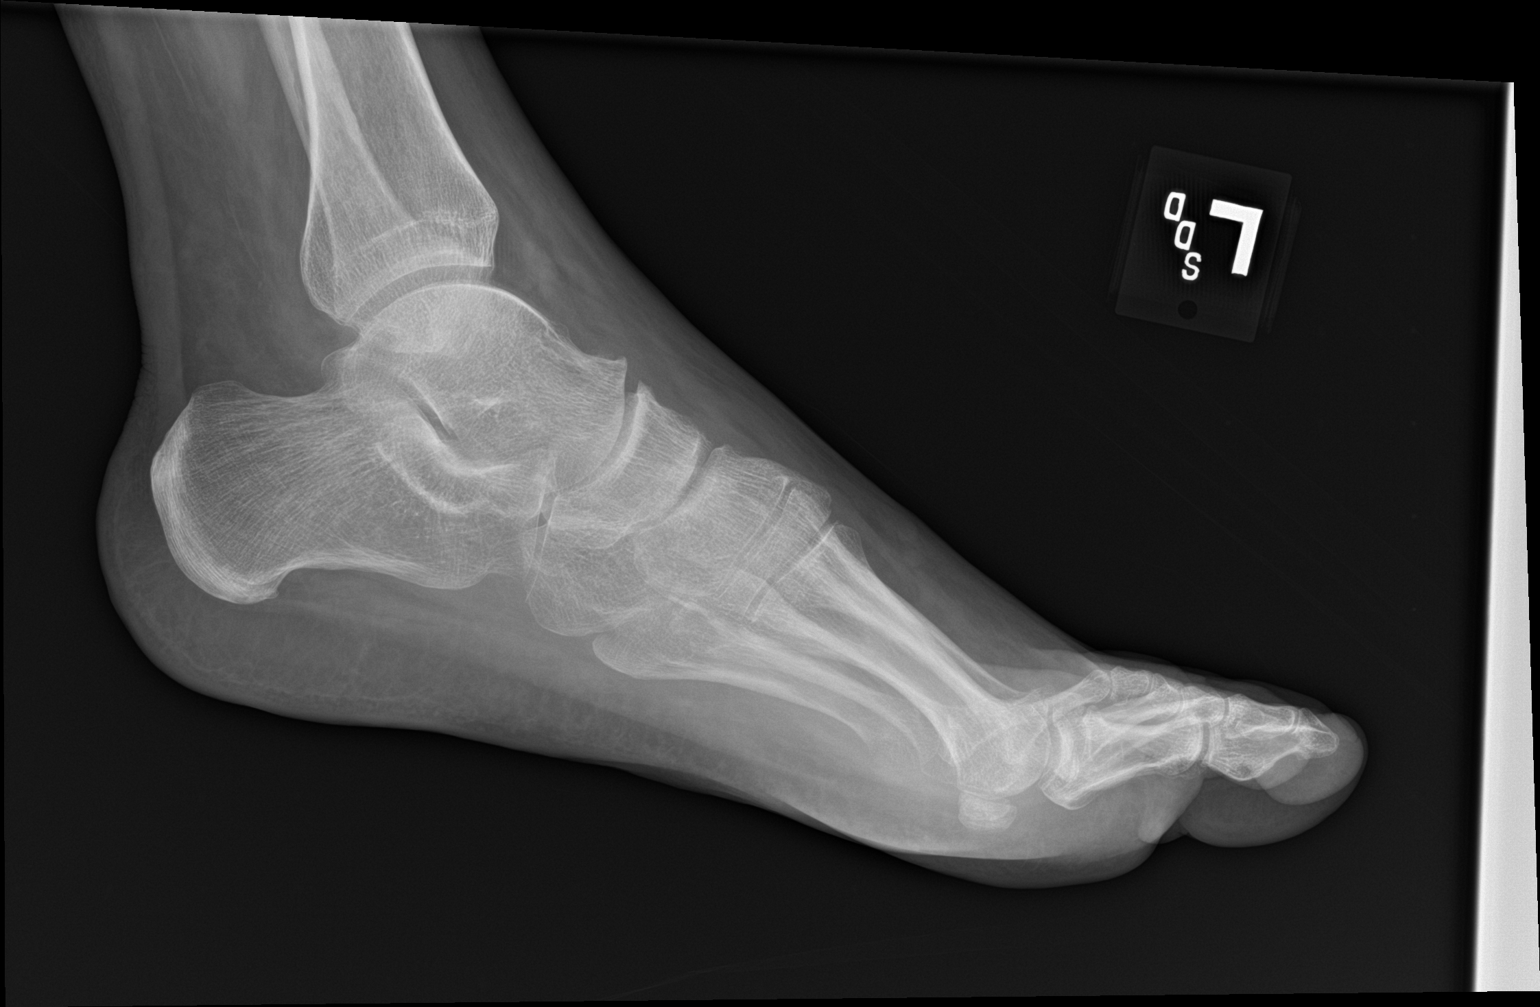

[2 of 2 positions shown; findings below may reference images not displayed]

FINDINGS: There is no evidence of fracture or dislocation. Mild degenerative
changes are seen along the dorsal aspect of the proximal to mid left
foot. Soft tissues are unremarkable.
IMPRESSION: Mild degenerative changes, as described above, without an acute
osseous abnormality.

## 2023-09-30 ENCOUNTER — Emergency Department: Payer: Self-pay

## 2023-09-30 ENCOUNTER — Observation Stay
Admission: EM | Admit: 2023-09-30 | Discharge: 2023-10-02 | Disposition: A | Discharge status: Home / Self Care | Payer: Self-pay | Attending: Hospitalist | Admitting: Hospitalist

## 2023-09-30 ENCOUNTER — Other Ambulatory Visit: Payer: Self-pay

## 2023-09-30 DIAGNOSIS — Z6828 Body mass index (BMI) 28.0-28.9, adult: Secondary | ICD-10-CM | POA: Insufficient documentation

## 2023-09-30 DIAGNOSIS — F10920 Alcohol use, unspecified with intoxication, uncomplicated: Secondary | ICD-10-CM | POA: Diagnosis not present

## 2023-09-30 DIAGNOSIS — E663 Overweight: Secondary | ICD-10-CM | POA: Insufficient documentation

## 2023-09-30 DIAGNOSIS — M87051 Idiopathic aseptic necrosis of right femur: Secondary | ICD-10-CM | POA: Diagnosis not present

## 2023-09-30 DIAGNOSIS — S82142A Displaced bicondylar fracture of left tibia, initial encounter for closed fracture: Principal | ICD-10-CM | POA: Diagnosis present

## 2023-09-30 DIAGNOSIS — M25562 Pain in left knee: Secondary | ICD-10-CM | POA: Diagnosis present

## 2023-09-30 DIAGNOSIS — R52 Pain, unspecified: Secondary | ICD-10-CM

## 2023-09-30 DIAGNOSIS — W19XXXA Unspecified fall, initial encounter: Secondary | ICD-10-CM | POA: Insufficient documentation

## 2023-09-30 DIAGNOSIS — R739 Hyperglycemia, unspecified: Secondary | ICD-10-CM | POA: Insufficient documentation

## 2023-09-30 DIAGNOSIS — Z23 Encounter for immunization: Secondary | ICD-10-CM | POA: Insufficient documentation

## 2023-09-30 DIAGNOSIS — F12920 Cannabis use, unspecified with intoxication, uncomplicated: Secondary | ICD-10-CM | POA: Diagnosis not present

## 2023-09-30 DIAGNOSIS — S82209A Unspecified fracture of shaft of unspecified tibia, initial encounter for closed fracture: Secondary | ICD-10-CM | POA: Diagnosis present

## 2023-09-30 DIAGNOSIS — S82125A Nondisplaced fracture of lateral condyle of left tibia, initial encounter for closed fracture: Principal | ICD-10-CM | POA: Insufficient documentation

## 2023-09-30 LAB — CBC WITH DIFFERENTIAL/PLATELET
Abs Immature Granulocytes: 0.05 K/uL (ref 0.00–0.07)
Basophils Absolute: 0 K/uL (ref 0.0–0.1)
Basophils Relative: 0 %
Eosinophils Absolute: 0.2 K/uL (ref 0.0–0.5)
Eosinophils Relative: 2 %
HCT: 43.7 % (ref 39.0–52.0)
Hemoglobin: 14.4 g/dL (ref 13.0–17.0)
Immature Granulocytes: 1 %
Lymphocytes Relative: 13 %
Lymphs Abs: 1.4 K/uL (ref 0.7–4.0)
MCH: 31 pg (ref 26.0–34.0)
MCHC: 33 g/dL (ref 30.0–36.0)
MCV: 94 fL (ref 80.0–100.0)
Monocytes Absolute: 0.7 K/uL (ref 0.1–1.0)
Monocytes Relative: 6 %
Neutro Abs: 8.5 K/uL — ABNORMAL HIGH (ref 1.7–7.7)
Neutrophils Relative %: 78 %
Platelets: 283 K/uL (ref 150–400)
RBC: 4.65 MIL/uL (ref 4.22–5.81)
RDW: 13.4 % (ref 11.5–15.5)
WBC: 10.9 K/uL — ABNORMAL HIGH (ref 4.0–10.5)
nRBC: 0 % (ref 0.0–0.2)

## 2023-09-30 LAB — BASIC METABOLIC PANEL WITH GFR
Anion gap: 14 (ref 5–15)
BUN: 6 mg/dL (ref 6–20)
CO2: 22 mmol/L (ref 22–32)
Calcium: 8.9 mg/dL (ref 8.9–10.3)
Chloride: 105 mmol/L (ref 98–111)
Creatinine, Ser: 0.79 mg/dL (ref 0.61–1.24)
GFR, Estimated: >60 mL/min (ref >60)
Glucose, Bld: 123 mg/dL — ABNORMAL HIGH (ref 70–99)
Potassium: 3.5 mmol/L (ref 3.5–5.1)
Sodium: 141 mmol/L (ref 135–145)

## 2023-09-30 MED ORDER — CYCLOBENZAPRINE HCL 10 MG PO TABS
10.0000 mg | ORAL_TABLET | Freq: Once | ORAL | Status: AC
  Administered 2023-09-30: 10 mg via ORAL
  Filled 2023-09-30: qty 1

## 2023-09-30 MED ORDER — ONDANSETRON HCL 4 MG/2ML IJ SOLN
4.0000 mg | Freq: Once | INTRAMUSCULAR | Status: AC
  Administered 2023-09-30: 4 mg via INTRAVENOUS
  Filled 2023-09-30: qty 2

## 2023-09-30 MED ORDER — OXYCODONE-ACETAMINOPHEN 5-325 MG PO TABS
1.0000 | ORAL_TABLET | Freq: Once | ORAL | Status: AC
  Administered 2023-09-30: 1 via ORAL
  Filled 2023-09-30: qty 1

## 2023-09-30 MED ORDER — KETOROLAC TROMETHAMINE 30 MG/ML IJ SOLN
30.0000 mg | Freq: Once | INTRAMUSCULAR | Status: AC
  Administered 2023-09-30: 30 mg via INTRAVENOUS
  Filled 2023-09-30: qty 1

## 2023-09-30 MED ORDER — MORPHINE SULFATE (PF) 4 MG/ML IV SOLN
4.0000 mg | Freq: Once | INTRAVENOUS | Status: AC
  Administered 2023-09-30: 4 mg via INTRAVENOUS
  Filled 2023-09-30: qty 1

## 2023-09-30 NOTE — ED Notes (Signed)
 See triage note. This nurse asked the patient what his company required for [employer] He said he didn't know and when asked if he wanted to call his manager to find out his company's requirements. He stated I don't want to get fired, forget the workmans comp, I'll take care of this on my own. This nurse informed the patient that workmans comp was not something that he could get fired over, it is a legal requirement of employers to cover people who are injured on the job, he repeated his position and stated I'll take care of this on my own. Registration informed. They will confirm patients desire to not use workmans comp. Upon assessment, left knee swollen to almost twice the circumference of his right knee. Pt states he is unable to bear weight. Provider notified.

## 2023-09-30 NOTE — ED Triage Notes (Addendum)
 Pt to ED via [facility] from work. Pt was at work [place] [employer] and fell and landed on buttocks. Pt reports left knee popped. Pt at baseline uses crutches for chronica knee pain. Pt also reports left hip pain. Pt states pain 10/10. Pt reports dizziness after fall  110 HR  136/98

## 2023-09-30 NOTE — ED Provider Notes (Signed)
 [facility] Provider Note    Event Date/Time   First MD Initiated Contact with Patient 09/30/23 1941     (approximate)   History   Fall   HPI  Terrick Mcneill is a 38 y.o. male with history of scoliosis, gout, bilateral femoral head necrosis presents emergency department after a fall.  Patient having severe pain going across his pelvis.  States also left knee pain.  States it swollen and felt a pop when he fell.  No head injury.  States he was at work and helping his boss take something out to the car when he ended up falling.      Physical Exam   Triage Vital Signs: ED Triage Vitals  Encounter Vitals Group     BP 09/30/23 1722 (!) 124/99     Girls Systolic BP Percentile --      Girls Diastolic BP Percentile --      Boys Systolic BP Percentile --      Boys Diastolic BP Percentile --      Pulse Rate 09/30/23 1720 (!) 108     Resp 09/30/23 1720 18     Temp 09/30/23 1720 98.1 F (36.7 C)     Temp Source 09/30/23 1720 Oral     SpO2 09/30/23 1720 100 %     Weight --      Height --      Head Circumference --      Peak Flow --      Pain Score 09/30/23 1721 10     Pain Loc --      Pain Education --      Exclude from Growth Chart --     Most recent vital signs: Vitals:   09/30/23 1720 09/30/23 1722  BP:  (!) 124/99  Pulse: (!) 108   Resp: 18   Temp: 98.1 F (36.7 C)   SpO2: 100%      General: Awake, no distress.   CV:  Good peripheral perfusion. Resp:  Normal effort.  Abd:  No distention.   Other:  Hips are tender bilaterally, left knee is swollen and tender, neurovascular intact   ED Results / Procedures / Treatments   Labs (all labs ordered are listed, but only abnormal results are displayed) Labs Reviewed  BASIC METABOLIC PANEL WITH GFR - Abnormal; Notable for the following components:      Result Value   Glucose, Bld 123 (*)    All other components within normal limits  CBC WITH DIFFERENTIAL/PLATELET - Abnormal; Notable  for the following components:   WBC 10.9 (*)    Neutro Abs 8.5 (*)    All other components within normal limits     EKG     RADIOLOGY X-ray of the left knee, x-ray left hip CT pelvis    PROCEDURES:   Procedures  Critical Care:  no Chief Complaint  Patient presents with   Fall      MEDICATIONS ORDERED IN ED: Medications  oxyCODONE -acetaminophen  (PERCOCET/ROXICET) 5-325 MG per tablet 1 tablet (1 tablet Oral Given 09/30/23 2040)  morphine  (PF) 4 MG/ML injection 4 mg (4 mg Intravenous Given 09/30/23 2233)  ondansetron  (ZOFRAN ) injection 4 mg (4 mg Intravenous Given 09/30/23 2233)  cyclobenzaprine  (FLEXERIL ) tablet 10 mg (10 mg Oral Given 09/30/23 2341)  ketorolac  (TORADOL ) 30 MG/ML injection 30 mg (30 mg Intravenous Given 09/30/23 2342)     IMPRESSION / MDM / ASSESSMENT AND PLAN / ED COURSE  I reviewed the triage vital signs  and the nursing notes.                              Differential diagnosis includes, but is not limited to, fracture, contusion, strain  Patient's presentation is most consistent with acute illness / injury with system symptoms.    Medications given: Percocet p.o., morphine  4 mg IV, Zofran  4 mg IV, Flexeril  10 mg p.o.  X-ray of the left hip independently reviewed interpreted by me as having a questionable fracture along the right lower ramus, necrosis noted on both femoral heads but no obvious fracture, read as negative by radiology  X-ray of the left knee independently reviewed interpreted by me as being negative for any acute abnormality  Due to the bilateral necrosis noted on patient's chart along with continued pelvic pain we will go ahead do CT of the pelvis to ensure there is final occult fracture  Patient was given 1 dose Percocet pain is not controlled.  Will do an IV and give him morphine  4 mg IV  CT of the pelvis was independently reviewed interpreted by me as being negative for acute abnormality  CT of the left knee does show a  tibial plateau fracture.  This was also independently reviewed interpreted by me  Consult to orthopedics.  Dr. Tobie states it is a minimal fracture, does not need surgery at this time.  He is to be in a knee immobilizer, crutches and is to be nonweightbearing.  Possible admission for pain control with the hospitalist if needed.  Since the patient has continued to have pain even with the pain medications.  We have had to add Flexeril  for muscle spasms.  I do not feel that he would do well with oral medication at home.  Will go ahead and consult hospitalist for admission once I have basic labs back.   Labs reassuring  Consult hospitalist.  Dr. Lawence to admit.  Patient is stable at this time.  Is in agreement with treatment plan  FINAL CLINICAL IMPRESSION(S) / ED DIAGNOSES   Final diagnoses:  Closed fracture of left tibial plateau, initial encounter  Inadequate pain control     Rx / DC Orders   ED Discharge Orders     None        Note:  This document was prepared using Dragon voice recognition software and may include unintentional dictation errors.    Gasper Devere ORN, PA-C 09/30/23 2357    Floy Roberts, MD 10/01/23 [phone]

## 2023-10-01 DIAGNOSIS — S82142A Displaced bicondylar fracture of left tibia, initial encounter for closed fracture: Secondary | ICD-10-CM | POA: Diagnosis present

## 2023-10-01 DIAGNOSIS — S82209A Unspecified fracture of shaft of unspecified tibia, initial encounter for closed fracture: Secondary | ICD-10-CM | POA: Diagnosis present

## 2023-10-01 LAB — CBC
HCT: 44.5 % (ref 39.0–52.0)
Hemoglobin: 14.4 g/dL (ref 13.0–17.0)
MCH: 30.8 pg (ref 26.0–34.0)
MCHC: 32.4 g/dL (ref 30.0–36.0)
MCV: 95.3 fL (ref 80.0–100.0)
Platelets: 269 K/uL (ref 150–400)
RBC: 4.67 MIL/uL (ref 4.22–5.81)
RDW: 13.5 % (ref 11.5–15.5)
WBC: 10.4 K/uL (ref 4.0–10.5)
nRBC: 0 % (ref 0.0–0.2)

## 2023-10-01 LAB — BASIC METABOLIC PANEL WITH GFR
Anion gap: 13 (ref 5–15)
BUN: 8 mg/dL (ref 6–20)
CO2: 25 mmol/L (ref 22–32)
Calcium: 8.7 mg/dL — ABNORMAL LOW (ref 8.9–10.3)
Chloride: 100 mmol/L (ref 98–111)
Creatinine, Ser: 0.87 mg/dL (ref 0.61–1.24)
GFR, Estimated: >60 mL/min (ref >60)
Glucose, Bld: 130 mg/dL — ABNORMAL HIGH (ref 70–99)
Potassium: 3.6 mmol/L (ref 3.5–5.1)
Sodium: 138 mmol/L (ref 135–145)

## 2023-10-01 LAB — HEMOGLOBIN A1C
Hgb A1c MFr Bld: 5.8 % — ABNORMAL HIGH (ref 4.8–5.6)
Mean Plasma Glucose: 119.76 mg/dL

## 2023-10-01 LAB — HIV ANTIBODY (ROUTINE TESTING W REFLEX): HIV Screen 4th Generation wRfx: NONREACTIVE

## 2023-10-01 MED ORDER — ACETAMINOPHEN 325 MG PO TABS: 650.0000 mg | ORAL_TABLET | Freq: Four times a day (QID) | ORAL | Status: DC | PRN

## 2023-10-01 MED ORDER — ACETAMINOPHEN 650 MG RE SUPP: 650.0000 mg | Freq: Four times a day (QID) | RECTAL | Status: DC | PRN

## 2023-10-01 MED ORDER — KETOROLAC TROMETHAMINE 30 MG/ML IJ SOLN
30.0000 mg | Freq: Three times a day (TID) | INTRAMUSCULAR | Status: DC
  Administered 2023-10-01 – 2023-10-02 (×5): 30 mg via INTRAVENOUS
  Filled 2023-10-01 (×5): qty 1

## 2023-10-01 MED ORDER — CYCLOBENZAPRINE HCL 10 MG PO TABS
5.0000 mg | ORAL_TABLET | Freq: Three times a day (TID) | ORAL | Status: DC
  Administered 2023-10-01 – 2023-10-02 (×5): 5 mg via ORAL
  Filled 2023-10-01 (×5): qty 1

## 2023-10-01 MED ORDER — ACETAMINOPHEN 325 MG PO TABS
650.0000 mg | ORAL_TABLET | Freq: Four times a day (QID) | ORAL | Status: DC
  Administered 2023-10-01 – 2023-10-02 (×6): 650 mg via ORAL
  Filled 2023-10-01 (×6): qty 2

## 2023-10-01 MED ORDER — INFLUENZA VIRUS VACC SPLIT PF (FLUZONE) 0.5 ML IM SUSY
0.5000 mL | PREFILLED_SYRINGE | INTRAMUSCULAR | Status: AC
  Administered 2023-10-02: 0.5 mL via INTRAMUSCULAR
  Filled 2023-10-01: qty 0.5

## 2023-10-01 MED ORDER — ENOXAPARIN SODIUM 40 MG/0.4ML IJ SOSY
40.0000 mg | PREFILLED_SYRINGE | INTRAMUSCULAR | Status: DC
Start: 2023-10-01 — End: 2023-10-02
  Administered 2023-10-01 – 2023-10-02 (×2): 40 mg via SUBCUTANEOUS
  Filled 2023-10-01 (×2): qty 0.4

## 2023-10-01 MED ORDER — LABETALOL HCL 5 MG/ML IV SOLN: 10.0000 mg | INTRAVENOUS | Status: DC | PRN

## 2023-10-01 MED ORDER — HYDRALAZINE HCL 20 MG/ML IJ SOLN: 10.0000 mg | Freq: Four times a day (QID) | INTRAMUSCULAR | Status: DC | PRN

## 2023-10-01 MED ORDER — MAGNESIUM HYDROXIDE 400 MG/5ML PO SUSP: 30.0000 mL | Freq: Every day | ORAL | Status: DC | PRN

## 2023-10-01 MED ORDER — ONDANSETRON HCL 4 MG PO TABS: 4.0000 mg | ORAL_TABLET | Freq: Four times a day (QID) | ORAL | Status: DC | PRN

## 2023-10-01 MED ORDER — SODIUM CHLORIDE 0.9 % IV SOLN
INTRAVENOUS | Status: DC
Start: 2023-10-01 — End: 2023-10-01

## 2023-10-01 MED ORDER — HYDROCODONE-ACETAMINOPHEN 5-325 MG PO TABS: 1.0000 | ORAL_TABLET | Freq: Four times a day (QID) | ORAL | Status: DC | PRN

## 2023-10-01 MED ORDER — TRAZODONE HCL 50 MG PO TABS: 25.0000 mg | ORAL_TABLET | Freq: Every evening | ORAL | Status: DC | PRN

## 2023-10-01 MED ORDER — ONDANSETRON HCL 4 MG/2ML IJ SOLN: 4.0000 mg | Freq: Four times a day (QID) | INTRAMUSCULAR | Status: DC | PRN

## 2023-10-01 MED ORDER — MORPHINE SULFATE (PF) 2 MG/ML IV SOLN
2.0000 mg | INTRAVENOUS | Status: DC | PRN
Start: 2023-10-01 — End: 2023-10-02
  Administered 2023-10-01 – 2023-10-02 (×4): 2 mg via INTRAVENOUS
  Filled 2023-10-01 (×4): qty 1

## 2023-10-01 MED ORDER — HYDROMORPHONE HCL 1 MG/ML IJ SOLN
1.0000 mg | Freq: Once | INTRAMUSCULAR | Status: AC
  Administered 2023-10-01: 1 mg via INTRAVENOUS
  Filled 2023-10-01: qty 1

## 2023-10-01 NOTE — Plan of Care (Signed)
   Problem: Education: Goal: Knowledge of General Education information will improve Description: Including pain rating scale, medication(s)/side effects and non-pharmacologic comfort measures Outcome: Progressing   Problem: Coping: Goal: Level of anxiety will decrease Outcome: Progressing

## 2023-10-01 NOTE — Progress Notes (Addendum)
 Mobility Specialist Progress Note:    10/01/23 1512  Mobility  Activity Stood at bedside;Pivoted/transferred from chair to bed  Level of Assistance Minimal assist, patient does 75% or more  Assistive Device Front wheel walker  Distance Ambulated (ft) 2 ft  Range of Motion/Exercises Active  LLE Weight Bearing Per Provider Order NWB  Activity Response Tolerated well  Mobility visit 1 Mobility  Mobility Specialist Start Time (ACUTE ONLY) 1500  Mobility Specialist Stop Time (ACUTE ONLY) 1512  Mobility Specialist Time Calculation (min) (ACUTE ONLY) 12 min   Assisted pt back to bed, required MinA +2 to stand and transfer with RW. LLE NWB, immobilizer was on upon arrival. Left pt supine, all needs met.  Sherrilee Ditty Mobility Specialist Please contact via Special educational needs teacher or  Rehab office at [phone]

## 2023-10-01 NOTE — H&P (Signed)
 History and Physical    Patient: Mason Russell FMW:[id number] DOB: January 21, 1985 DOA: 09/30/2023 DOS: the patient was seen and examined on 10/01/2023 PCP: System, Provider Not In  Patient coming from: Home  Chief Complaint:  Chief Complaint  Patient presents with   Fall   HPI: Mason Russell is a 38 y.o. male with medical history significant of scoliosis, gout, bilateral femoral head necrosis who presents to the emergency department after a fall at work.  Patient initially reported pain across his pelvis and left knee. CT in the ED revealed nondisplaced lateral tibial plateau fracture.  Orthopedic surgery was consulted.  Patient was having difficulty with pain control in the ED and thus was admitted for observation and mobilization. Labs and vitals in the ED are unremarkable.  At the time of my evaluation he is still complaining of significant pain.  Reports he was unable to mobilize with physical therapy.  He was unable to expand upon his history of femoral necrosis.   Review of Systems: ROS negative unless otherwise mentioned in HPI Past Medical History:  Diagnosis Date   Gout    Obesity with body mass index (BMI) of 30.0 to 39.9    Scoliosis    Past Surgical History:  Procedure Laterality Date   NO PAST SURGERIES     Social History:  reports that he has quit smoking. His smoking use included cigarettes. He has never used smokeless tobacco. He reports current alcohol use. He reports current drug use. Drug: Marijuana.  No Known Allergies  Family History  Problem Relation Age of Onset   Cancer Mother     Prior to Admission medications   Medication Sig Start Date End Date Taking? Authorizing Provider  acetaminophen  (TYLENOL ) 500 MG tablet Take 500 mg by mouth every 6 (six) hours as needed for mild pain.   Yes [provider]  allopurinol  (ZYLOPRIM ) 300 MG tablet Take 300 mg by mouth daily. Patient not taking: Reported on 10/01/2023    [provider]   atorvastatin (LIPITOR) 40 MG tablet Take 40 mg by mouth daily. Patient not taking: Reported on 10/01/2023    [provider]    Physical Exam: Vitals:   10/01/23 0029 10/01/23 0138 10/01/23 0424 10/01/23 0805  BP: (!) 126/93 133/89 133/80 (!) 132/96  Pulse: 97 87 73 92  Resp: 18 16 17 16   Temp: 98.1 F (36.7 C) 98.3 F (36.8 C) 97.6 F (36.4 C) 98.7 F (37.1 C)  TempSrc: Oral Oral Oral Oral  SpO2: 100% 99% 96% 97%  Weight:  78.6 kg    Height:  5' 5 (1.651 m)     Constitutional:  Normal appearance. Non toxic-appearing.  HENT: Head Normocephalic and atraumatic.  Mucous membranes are moist.  Eyes:  Extraocular intact. Conjunctivae normal. Pupils are equal, round, and reactive to light.  Cardiovascular: Rate and Rhythm: Normal rate and regular rhythm.  Pulmonary: Non labored, symmetric rise of chest wall.  Musculoskeletal: Knee immobilizer in place, patient's movement is limited by pain. Skin: warm and dry. not jaundiced.   Data Reviewed:  Lab Results  Component Value Date   WBC 10.4 10/01/2023   HGB 14.4 10/01/2023   HCT 44.5 10/01/2023   MCV 95.3 10/01/2023   PLT 269 10/01/2023      Latest Ref Rng & Units 10/01/2023    3:17 AM 09/30/2023   10:55 PM 08/12/2022    8:14 AM  BMP  Glucose 70 - 99 mg/dL 869  876  889   BUN 6 -  20 mg/dL 8  6  19    Creatinine 0.61 - 1.24 mg/dL 9.12  9.20  8.98   Sodium 135 - 145 mmol/L 138  141  144   Potassium 3.5 - 5.1 mmol/L 3.6  3.5  3.9   Chloride 98 - 111 mmol/L 100  105  110   CO2 22 - 32 mmol/L 25  22  22    Calcium 8.9 - 10.3 mg/dL 8.7  8.9  8.6    CT Knee Left Wo Contrast CLINICAL DATA:  Knee trauma  EXAM: CT OF THE LEFT KNEE WITHOUT CONTRAST  TECHNIQUE: Multidetector CT imaging of the left knee was performed according to the standard protocol. Multiplanar CT image reconstructions were also generated.  RADIATION DOSE REDUCTION: This exam was performed according to the departmental dose-optimization program which  includes automated exposure control, adjustment of the mA and/or kV according to patient size and/or use of iterative reconstruction technique.  COMPARISON:  Left knee x-ray same day  FINDINGS: Bones/Joint/Cartilage  There is a subtle lateral tibial plateau fracture without significant depression. Bones are diffusely osteopenic. There is no dislocation. Joint spaces are maintained. There is large lipohemarthrosis present.  Ligaments  Suboptimally assessed by CT.  Muscles and Tendons  No acute abnormality.  Soft tissues  No acute abnormality.  IMPRESSION: 1. Subtle lateral tibial plateau fracture without significant depression. 2. Large lipohemarthrosis.  Electronically Signed   By: Greig Pique M.D.   On: 09/30/2023 21:51 CT PELVIS WO CONTRAST CLINICAL DATA:  Hip trauma with fracture suspected. X-ray done. Patient fell, landing on the buttocks. Chronic knee pain. Now with left hip pain. Dizziness after the fall.  EXAM: CT PELVIS WITHOUT CONTRAST  TECHNIQUE: Multidetector CT imaging of the pelvis was performed following the standard protocol without intravenous contrast.  RADIATION DOSE REDUCTION: This exam was performed according to the departmental dose-optimization program which includes automated exposure control, adjustment of the mA and/or kV according to patient size and/or use of iterative reconstruction technique.  COMPARISON:  Left hip radiographs 09/30/2023. CT abdomen pelvis 08/03/2022  FINDINGS: Urinary Tract:  No abnormality visualized.  Bowel: Unremarkable visualized pelvic bowel loops. Appendix is normal.  Vascular/Lymphatic: No pathologically enlarged lymph nodes. No significant vascular abnormality seen.  Reproductive:  No mass or other significant abnormality  Other:  None.  Musculoskeletal: Visualized lower lumbar spine, sacrum, pelvis, and hips are intact. No evidence of acute fracture or dislocation. No focal bone lesion or  bone destruction. Visualized pelvic musculature is symmetrical. No intramuscular mass or hematoma.  IMPRESSION: 1. No acute bony abnormalities. No evidence of acute fracture or dislocation of the left hip. 2. Soft tissue pelvis appears intact. No acute abnormalities are demonstrated.  Electronically Signed   By: Elsie Gravely M.D.   On: 09/30/2023 21:45 DG Hip Unilat W or Wo Pelvis 2-3 Views Left CLINICAL DATA:  fall  EXAM: DG HIP (WITH OR WITHOUT PELVIS) 2-3V LEFT  COMPARISON:  09/28/2021  FINDINGS: No evidence of pelvic fracture or diastasis.No acute hip fracture or dislocation.There is no evidence of arthropathy or other focal bone abnormality. Soft tissues are unremarkable.  IMPRESSION: No acute fracture, pelvic bone diastasis, or dislocation.  Electronically Signed   By: Rogelia Myers M.D.   On: 09/30/2023 17:57 DG Knee Complete 4 Views Left CLINICAL DATA:  fall, decreased range of motion  EXAM: LEFT KNEE - COMPLETE 4+ VIEW  COMPARISON:  June 24, 2021  FINDINGS: No acute fracture or dislocation. Small to moderate volume joint effusion. There is  no evidence of arthropathy or other focal bone abnormality. Soft tissues are unremarkable.  IMPRESSION: Small to moderate volume joint effusion. No acute fracture or dislocation.  Electronically Signed   By: Rogelia Myers M.D.   On: 09/30/2023 17:55    Assessment and Plan:  Left nondisplaced lateral tibial plateau fracture - Fracture is nondisplaced.  Orthopedic surgery consulted and recommending nonoperative management - Continue nonweightbearing on the left lower extremity and remain in knee immobilizer - Continue working with PT/OT for assistance with transfers. - PT/OT reports that patient is having difficulty with crutches at this time - We have ordered wheelchair and BSC.  TOC consulted to assist with arrangements - Continue with scheduled Tylenol , ketorolac , muscle relaxants.  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 - Patient intermittently uses crutches at home when ambulating long distance  BMI 28 Overweight - Outpatient follow up for lifestyle modification and risk factor management  Hyperglycemia - Blood glucose consistently above 120 on CMP - Will obtain hemoglobin A1c   Advance Care Planning:   Code Status: Full Code   Consults: Orthopedic Surgery  Family Communication: Patient requested I speak directly with him. No family at bedside.   Severity of Illness: The appropriate patient status for this patient is OBSERVATION. Observation status is judged to be reasonable and necessary in order to provide the required intensity of service to ensure the patient's safety. The patient's presenting symptoms, physical exam findings, and initial radiographic and laboratory data in the context of their medical condition is felt to place them at decreased risk for further clinical deterioration. Furthermore, it is anticipated that the patient will be medically stable for discharge from the hospital within 2 midnights of admission.   Author: Amyjo Mizrachi, DO 10/01/2023 1:45 PM  For on call review [email or web address].

## 2023-10-01 NOTE — Plan of Care (Incomplete)
  Problem: Education: Goal: Knowledge of General Education information will improve Description: Including pain rating scale, medication(s)/side effects and non-pharmacologic comfort measures Outcome: Progressing   Problem: Clinical Measurements: Goal: Cardiovascular complication will be avoided Outcome: Progressing   Problem: Nutrition: Goal: Adequate nutrition will be maintained Outcome: Progressing   Problem: Coping: Goal: Level of anxiety will decrease Outcome: Progressing   Problem: Pain Managment: Goal: General experience of comfort will improve and/or be controlled Outcome: Progressing

## 2023-10-01 NOTE — TOC CM/SW Note (Signed)
 Patient is not able to walk the distance required to go the bathroom, or he/she is unable to safely negotiate stairs required to access the bathroom.  A 3in1 BSC will alleviate this problem

## 2023-10-01 NOTE — Evaluation (Signed)
 Occupational Therapy Evaluation Patient Details Name: Mason Russell MRN: [id number] DOB: 01/05/1986 Today's Date: 10/01/2023   History of Present Illness   Mason Russell is a 38 y.o. male with history of scoliosis, gout, bilateral femoral head necrosis who presents emergency department after a fall at work. 09/30/23 L knee CT: subtle lateral tibial plateau fracture. Plan to treat nonoperatively in KI, NWB.   Clinical Impressions Mason Russell was seen for OT evaluation this date. Prior to hospital admission, pt was MOD I using crutches and working. Pt lives with family in apartment. Pt currently requires MAX A Don/doff KI at bed level. MOD A exit bed, fair sitting balance. Attempted standing with crutches, unable to do so safely while maintaining LLE NWBing pcns. MIN A + RW for bed>chair hop pivot t/f, +2 for safety to ensure maintaining NWBing pcns. Educated on Counsellor. Reports 10/10 pain t/o session despite premedicated. Pt would benefit from skilled OT to address noted impairments and functional limitations (see below for any additional details). Upon hospital discharge, recommend OT follow up <3 hours/day.    If plan is discharge home, recommend the following:   A lot of help with walking and/or transfers;A lot of help with bathing/dressing/bathroom;Help with stairs or ramp for entrance     Functional Status Assessment   Patient has had a recent decline in their functional status and demonstrates the ability to make significant improvements in function in a reasonable and predictable amount of time.     Equipment Recommendations   BSC/3in1;Other (comment) (RW)     Recommendations for Other Services         Precautions/Restrictions   Precautions Precautions: Fall Recall of Precautions/Restrictions: Intact Required Braces or Orthoses: Knee Immobilizer - Left Knee Immobilizer - Left: On at all times Restrictions Weight Bearing Restrictions Per Provider Order:  Yes LLE Weight Bearing Per Provider Order: Non weight bearing     Mobility Bed Mobility Overal bed mobility: Needs Assistance Bed Mobility: Supine to Sit     Supine to sit: Mod assist          Transfers Overall transfer level: Needs assistance Equipment used: Rolling walker (2 wheels) Transfers: Bed to chair/wheelchair/BSC, Sit to/from Stand Sit to Stand: Min assist     Step pivot transfers: Min assist, +2 safety/equipment     General transfer comment: 1 posterior LOB with hopping requiring MOD A to correct, +2 to ensure maintaining NWBing pcns      Balance Overall balance assessment: Needs assistance Sitting-balance support: No upper extremity supported, Feet supported Sitting balance-Leahy Scale: Fair     Standing balance support: Bilateral upper extremity supported Standing balance-Leahy Scale: Poor                             ADL either performed or assessed with clinical judgement   ADL Overall ADL's : Needs assistance/impaired                                       General ADL Comments: MIN A + RW simulated BSC t/f. MAX A don/doff KI at bed level and B socks sitting     Vision         Perception         Praxis         Pertinent Vitals/Pain Pain Assessment Pain Assessment: 0-10 Pain Score: 10-Worst pain ever Pain  Location: L knee Pain Descriptors / Indicators: Guarding, Grimacing, Discomfort Pain Intervention(s): Limited activity within patient's tolerance, Premedicated before session, Repositioned     Extremity/Trunk Assessment Upper Extremity Assessment Upper Extremity Assessment: Overall WFL for tasks assessed   Lower Extremity Assessment Lower Extremity Assessment: Defer to PT evaluation       Communication Communication Communication: No apparent difficulties   Cognition Arousal: Alert Behavior During Therapy: WFL for tasks assessed/performed Cognition: No apparent impairments                                Following commands: Impaired Following commands impaired: Follows multi-step commands inconsistently     Cueing  General Comments   Cueing Techniques: Verbal cues      Exercises     Shoulder Instructions      Home Living Family/patient expects to be discharged to:: Private residence Living Arrangements: Parent;Other relatives Available Help at Discharge: Family Type of Home: Apartment Home Access: Stairs to enter Entrance Stairs-Number of Steps: 1 + 1 steps to parking lot   Home Layout: One level               Home Equipment: Crutches          Prior Functioning/Environment Prior Level of Function : Independent/Modified Independent             Mobility Comments: pt reports using crutches in home and to travel to/from work but does not use them while at work - reports the crutches are so he can get around fast      OT Problem List: Decreased strength;Decreased range of motion;Decreased activity tolerance;Impaired balance (sitting and/or standing);Pain   OT Treatment/Interventions: Self-care/ADL training;Therapeutic exercise;Energy conservation;DME and/or AE instruction;Therapeutic activities;Patient/family education;Balance training      OT Goals(Current goals can be found in the care plan section)   Acute Rehab OT Goals Patient Stated Goal: to go home OT Goal Formulation: With patient Time For Goal Achievement: 10/15/23 Potential to Achieve Goals: Good ADL Goals Pt Will Perform Grooming: with modified independence;standing Pt Will Perform Lower Body Dressing: with modified independence;sitting/lateral leans Pt Will Transfer to Toilet: with modified independence;ambulating;regular height toilet   OT Frequency:  Min 3X/week    Co-evaluation PT/OT/SLP Co-Evaluation/Treatment: Yes Reason for Co-Treatment: For patient/therapist safety;To address functional/ADL transfers PT goals addressed during session: Mobility/safety with  mobility OT goals addressed during session: ADL's and self-care      AM-PAC OT 6 Clicks Daily Activity     Outcome Measure Help from another person eating meals?: None Help from another person taking care of personal grooming?: A Little Help from another person toileting, which includes using toliet, bedpan, or urinal?: A Lot Help from another person bathing (including washing, rinsing, drying)?: A Lot Help from another person to put on and taking off regular upper body clothing?: None Help from another person to put on and taking off regular lower body clothing?: A Lot 6 Click Score: 17   End of Session Equipment Utilized During Treatment: Rolling walker (2 wheels);Gait belt Nurse Communication: Mobility status  Activity Tolerance: Patient tolerated treatment well Patient left: in chair;with call bell/phone within reach;with chair alarm set  OT Visit Diagnosis: Other abnormalities of gait and mobility (R26.89);Muscle weakness (generalized) (M62.81)                Time: 8967-8941 OT Time Calculation (min): 26 min Charges:  OT General Charges $OT Visit: 1 Visit OT Evaluation $OT Eval  Low Complexity: 1 Low OT Treatments $Self Care/Home Management : 8-22 mins  Elston Slot, M.S. OTR/L  10/01/23, 12:08 PM  ascom [phone]

## 2023-10-01 NOTE — Evaluation (Signed)
 Physical Therapy Evaluation Patient Details Name: Mason Russell MRN: [id number] DOB: 1985-07-24 Today's Date: 10/01/2023  History of Present Illness  Hardy Vandiver is a 38 y.o. male with history of scoliosis, gout, bilateral femoral head necrosis who presents emergency department after a fall at work. 09/30/23 L knee CT: subtle lateral tibial plateau fracture. Plan to treat nonoperatively in KI, NWB.   Clinical Impression  Pt A&Ox4, agreeable to PT/OT co-evaluation, cited L knee pain of 10/10 intensity- RN informed and premedicated pt for session. At baseline, pt reports using crutches for household amb and amb to/from work, IND with ADLs. Pt was met supine in bed, modA for BLE assist out of bed to sit EOB. Attempted STS from EOB with crutches, ultimately utilized RW with minAx2 to stand and VC for hand placement. MinAx2 for hop-pivot to recliner, +2 for safety and to ensure adherance to NWB precautions. Pt was left seated in recliner at end of session, all needs in reach. Pt is currently displaying deficits in activity tolerance, balance, and functional mobility and would benefit from skilled PT intervention to address listed deficits and allow for safe return to PLOF.       If plan is discharge home, recommend the following: A lot of help with walking and/or transfers;A lot of help with bathing/dressing/bathroom;Assistance with cooking/housework;Assist for transportation;Help with stairs or ramp for entrance   Can travel by private vehicle   No    Equipment Recommendations Other (comment) (TBD)  Recommendations for Other Services       Functional Status Assessment Patient has had a recent decline in their functional status and demonstrates the ability to make significant improvements in function in a reasonable and predictable amount of time.     Precautions / Restrictions Precautions Precautions: Fall Recall of Precautions/Restrictions: Intact Precaution/Restrictions Comments: NWB  LLE w/ knee imm Required Braces or Orthoses: Knee Immobilizer - Left Knee Immobilizer - Left: On at all times Restrictions Weight Bearing Restrictions Per Provider Order: Yes LLE Weight Bearing Per Provider Order: Non weight bearing      Mobility  Bed Mobility Overal bed mobility: Needs Assistance Bed Mobility: Supine to Sit     Supine to sit: Mod assist     General bed mobility comments: modA BLE assist out of bed    Transfers Overall transfer level: Needs assistance Equipment used: Rolling walker (2 wheels) Transfers: Bed to chair/wheelchair/BSC, Sit to/from Stand Sit to Stand: Min assist   Step pivot transfers: Min assist, +2 safety/equipment       General transfer comment: modA to correct posterior LOB during initial hopping, +2 for RW management and to ensure adherance to NWB precautions    Ambulation/Gait                  Stairs            Wheelchair Mobility     Tilt Bed    Modified Rankin (Stroke Patients Only)       Balance Overall balance assessment: Needs assistance Sitting-balance support: Feet supported Sitting balance-Leahy Scale: Fair Sitting balance - Comments: requires UE support for dyanmic sitting   Standing balance support: Bilateral upper extremity supported Standing balance-Leahy Scale: Poor Standing balance comment: heavy BUE support on RW                             Pertinent Vitals/Pain Pain Assessment Pain Assessment: 0-10 Pain Score: 10-Worst pain ever Pain Location: L knee Pain Descriptors /  Indicators: Guarding, Grimacing, Discomfort Pain Intervention(s): Limited activity within patient's tolerance, Monitored during session, Premedicated before session, Repositioned    Home Living Family/patient expects to be discharged to:: Private residence Living Arrangements: Parent;Other relatives Available Help at Discharge: Family Type of Home: Apartment Home Access: Stairs to enter   Entrance  Stairs-Number of Steps: 1 + 1 steps to parking lot   Home Layout: One level Home Equipment: Crutches      Prior Function Prior Level of Function : Independent/Modified Independent             Mobility Comments: pt reports using crutches for amb at baseline at home and getting to/from work ADLs Comments: IND with ADLs     Extremity/Trunk Assessment   Upper Extremity Assessment Upper Extremity Assessment: Defer to OT evaluation    Lower Extremity Assessment Lower Extremity Assessment: RLE deficits/detail;LLE deficits/detail RLE Deficits / Details: grossly WFL LLE Deficits / Details: not formally tested, LLE in knee immobilizer, pain with any movement of LLE       Communication   Communication Communication: No apparent difficulties    Cognition Arousal: Alert Behavior During Therapy: WFL for tasks assessed/performed   PT - Cognitive impairments: No apparent impairments                       PT - Cognition Comments: A&Ox4 Following commands: Impaired Following commands impaired: Follows multi-step commands inconsistently     Cueing Cueing Techniques: Verbal cues     General Comments      Exercises     Assessment/Plan    PT Assessment Patient needs continued PT services  PT Problem List Decreased strength;Decreased range of motion;Decreased activity tolerance;Decreased balance;Decreased mobility;Decreased knowledge of use of DME;Decreased safety awareness;Pain       PT Treatment Interventions DME instruction;Gait training;Stair training;Functional mobility training;Therapeutic activities;Therapeutic exercise;Balance training;Neuromuscular re-education;Patient/family education    PT Goals (Current goals can be found in the Care Plan section)  Acute Rehab PT Goals Patient Stated Goal: to get better PT Goal Formulation: With patient Time For Goal Achievement: 10/15/23 Potential to Achieve Goals: Good    Frequency 7X/week     Co-evaluation  PT/OT/SLP Co-Evaluation/Treatment: Yes Reason for Co-Treatment: For patient/therapist safety;To address functional/ADL transfers PT goals addressed during session: Mobility/safety with mobility OT goals addressed during session: ADL's and self-care       AM-PAC PT 6 Clicks Mobility  Outcome Measure Help needed turning from your back to your side while in a flat bed without using bedrails?: A Little Help needed moving from lying on your back to sitting on the side of a flat bed without using bedrails?: A Lot Help needed moving to and from a bed to a chair (including a wheelchair)?: A Lot Help needed standing up from a chair using your arms (e.g., wheelchair or bedside chair)?: A Little Help needed to walk in hospital room?: A Lot Help needed climbing 3-5 steps with a railing? : A Lot 6 Click Score: 14    End of Session Equipment Utilized During Treatment: Gait belt;Left knee immobilizer Activity Tolerance: Patient limited by pain Patient left: in chair;with call bell/phone within reach;with chair alarm set Nurse Communication: Mobility status PT Visit Diagnosis: Unsteadiness on feet (R26.81);Other abnormalities of gait and mobility (R26.89);Muscle weakness (generalized) (M62.81);History of falling (Z91.81);Difficulty in walking, not elsewhere classified (R26.2);Pain Pain - Right/Left: Left Pain - part of body: Knee    Time: 8965-8940 PT Time Calculation (min) (ACUTE ONLY): 25 min   Charges:  PT Evaluation $PT Eval Low Complexity: 1 Low PT Treatments $Therapeutic Activity: 8-22 mins PT General Charges $$ ACUTE PT VISIT: 1 Visit         Janell Axe, SPT

## 2023-10-01 NOTE — Consult Note (Signed)
 ORTHOPAEDIC CONSULTATION  REQUESTING PHYSICIAN: Dezii, Alexandra, DO  Chief Complaint:   L hip pain  History of Present Illness: Mason Russell is a 38 y.o. male who had a fall earlier yesterday while at work.  He works at a [employer].  The patient noted immediate left knee pain and inability to ambulate.  He has underlying bilateral hip AVN.  He has difficulty with mobilization at baseline and uses crutches for ambulating longer distances out of the house and stairs.  The patient lives at home with his parents and brother.  X-rays in the emergency department did not show any notable abnormality other than a large joint effusion.  A CT scan of the knee subsequently showed an essentially nondisplaced lateral tibial plateau fracture.  He was having significant pain and was admitted for pain control and mobilization.   Past Medical History:  Diagnosis Date   Gout    Obesity with body mass index (BMI) of 30.0 to 39.9    Scoliosis    Past Surgical History:  Procedure Laterality Date   NO PAST SURGERIES     Social History   Socioeconomic History   Marital status: Single    Spouse name: Not on file   Number of children: Not on file   Years of education: Not on file   Highest education level: Not on file  Occupational History   Not on file  Tobacco Use   Smoking status: Former    Types: Cigarettes   Smokeless tobacco: Never  Vaping Use   Vaping status: Never Used  Substance and Sexual Activity   Alcohol use: Yes    Comment: occasionally   Drug use: Yes    Types: Marijuana   Sexual activity: Not on file  Other Topics Concern   Not on file  Social History Narrative   Not on file   Social Drivers of Health   Financial Resource Strain: Not on file  Food Insecurity: No Food Insecurity (10/01/2023)   Hunger Vital Sign    Worried About Running Out of Food in the Last Year: Never true    Ran Out of Food in  the Last Year: Never true  Transportation Needs: No Transportation Needs (10/01/2023)   PRAPARE - Administrator, Civil Service (Medical): No    Lack of Transportation (Non-Medical): No  Physical Activity: Not on file  Stress: Not on file  Social Connections: Not on file   Family History  Problem Relation Age of Onset   Cancer Mother    No Known Allergies Prior to Admission medications   Medication Sig Start Date End Date Taking? Authorizing Provider  acetaminophen  (TYLENOL ) 500 MG tablet Take 500 mg by mouth every 6 (six) hours as needed for mild pain.   Yes [provider]  allopurinol  (ZYLOPRIM ) 300 MG tablet Take 300 mg by mouth daily. Patient not taking: Reported on 10/01/2023    [provider]  atorvastatin (LIPITOR) 40 MG tablet Take 40 mg by mouth daily. Patient not taking: Reported on 10/01/2023    [provider]   Recent Labs    09/30/23 2255 10/01/23 0317  WBC 10.9* 10.4  HGB 14.4 14.4  HCT 43.7 44.5  PLT 283 269  K 3.5 3.6  CL 105 100  CO2 22 25  BUN 6 8  CREATININE 0.79 0.87  GLUCOSE 123* 130*  CALCIUM 8.9 8.7*   CT Knee Left Wo Contrast Result Date: 09/30/2023 CLINICAL DATA:  Knee trauma EXAM: CT OF  THE LEFT KNEE WITHOUT CONTRAST TECHNIQUE: Multidetector CT imaging of the left knee was performed according to the standard protocol. Multiplanar CT image reconstructions were also generated. RADIATION DOSE REDUCTION: This exam was performed according to the departmental dose-optimization program which includes automated exposure control, adjustment of the mA and/or kV according to patient size and/or use of iterative reconstruction technique. COMPARISON:  Left knee x-ray same day FINDINGS: Bones/Joint/Cartilage There is a subtle lateral tibial plateau fracture without significant depression. Bones are diffusely osteopenic. There is no dislocation. Joint spaces are maintained. There is large lipohemarthrosis present. Ligaments  Suboptimally assessed by CT. Muscles and Tendons No acute abnormality. Soft tissues No acute abnormality. IMPRESSION: 1. Subtle lateral tibial plateau fracture without significant depression. 2. Large lipohemarthrosis. Electronically Signed   By: Greig Pique M.D.   On: 09/30/2023 21:51   CT PELVIS WO CONTRAST Result Date: 09/30/2023 CLINICAL DATA:  Hip trauma with fracture suspected. X-ray done. Patient fell, landing on the buttocks. Chronic knee pain. Now with left hip pain. Dizziness after the fall. EXAM: CT PELVIS WITHOUT CONTRAST TECHNIQUE: Multidetector CT imaging of the pelvis was performed following the standard protocol without intravenous contrast. RADIATION DOSE REDUCTION: This exam was performed according to the departmental dose-optimization program which includes automated exposure control, adjustment of the mA and/or kV according to patient size and/or use of iterative reconstruction technique. COMPARISON:  Left hip radiographs 09/30/2023. CT abdomen pelvis 08/03/2022 FINDINGS: Urinary Tract:  No abnormality visualized. Bowel: Unremarkable visualized pelvic bowel loops. Appendix is normal. Vascular/Lymphatic: No pathologically enlarged lymph nodes. No significant vascular abnormality seen. Reproductive:  No mass or other significant abnormality Other:  None. Musculoskeletal: Visualized lower lumbar spine, sacrum, pelvis, and hips are intact. No evidence of acute fracture or dislocation. No focal bone lesion or bone destruction. Visualized pelvic musculature is symmetrical. No intramuscular mass or hematoma. IMPRESSION: 1. No acute bony abnormalities. No evidence of acute fracture or dislocation of the left hip. 2. Soft tissue pelvis appears intact. No acute abnormalities are demonstrated. Electronically Signed   By: Elsie Gravely M.D.   On: 09/30/2023 21:45   DG Hip Unilat W or Wo Pelvis 2-3 Views Left Result Date: 09/30/2023 CLINICAL DATA:  fall EXAM: DG HIP (WITH OR WITHOUT PELVIS) 2-3V  LEFT COMPARISON:  09/28/2021 FINDINGS: No evidence of pelvic fracture or diastasis.No acute hip fracture or dislocation.There is no evidence of arthropathy or other focal bone abnormality. Soft tissues are unremarkable. IMPRESSION: No acute fracture, pelvic bone diastasis, or dislocation. Electronically Signed   By: Rogelia Myers M.D.   On: 09/30/2023 17:57   DG Knee Complete 4 Views Left Result Date: 09/30/2023 CLINICAL DATA:  fall, decreased range of motion EXAM: LEFT KNEE - COMPLETE 4+ VIEW COMPARISON:  June 24, 2021 FINDINGS: No acute fracture or dislocation. Small to moderate volume joint effusion. There is no evidence of arthropathy or other focal bone abnormality. Soft tissues are unremarkable. IMPRESSION: Small to moderate volume joint effusion. No acute fracture or dislocation. Electronically Signed   By: Rogelia Myers M.D.   On: 09/30/2023 17:55     Positive ROS: All other systems have been reviewed and were otherwise negative with the exception of those mentioned in the HPI and as above.  Physical Exam: BP (!) 132/96 (BP Location: Right Arm)   Pulse 92   Temp 98.7 F (37.1 C) (Oral)   Resp 16   Ht 5' 5 (1.651 m)   Wt 78.6 kg   SpO2 97%   BMI 28.84 kg/m  General:  Alert, no acute distress Psychiatric:  Patient is competent for consent with normal mood and affect    Orthopedic Exam:  LLE: + DF/PF/EHL SILT grossly over foot Foot wwp Significant tenderness to palpation about the lateral tibial plateau. 2+ knee joint effusion   Imaging:  As above: L essentially nondisplaced lateral tibial plateau fracture.  Bilateral hip AVN without collapse also noted.  Assessment/Plan: Mason Russell is a 39 y.o. male with a L non-displaced lateral tibial plateau fracture with underlying bilateral hip AVN without collapse 1. I discussed the new diagnosis.  Given the nondisplaced nature of the fracture, I recommended continued nonoperative management.  Patient should remain  nonweightbearing on the left lower extremity.  He should remain in a knee immobilizer.  This was reapplied after examining the leg. 2.  Recommend continued mobilization with PT/OT. 3.  NSAIDs, Tylenol , muscle relaxants, p.o. narcotics as needed for pain control. 4.  Patient should follow-up with us  in approximately 1-2 weeks as an outpatient for repeat x-rays.   Earnestine Blanch   10/01/2023 1:18 PM

## 2023-10-01 NOTE — Progress Notes (Signed)
 Occupational Therapy Treatment Patient Details Name: Mason Russell MRN: [id number] DOB: Feb 05, 1985 Today's Date: 10/01/2023   History of present illness Mason Russell is a 38 y.o. male with history of scoliosis, gout, bilateral femoral head necrosis who presents emergency department after a fall at work. 09/30/23 L knee CT: subtle lateral tibial plateau fracture. Plan to treat nonoperatively in KI, NWB.   OT comments  Mason Russell was seen for OT treatment on this date. Upon arrival to room pt in bed, agreeable to tx. Pt requires MIN A exit bed. CGA + RW bed>w/c>bed pivot t/f, cues for sequencing. 10/10 pain start of session, RN in for pain meds during session. Educated on functional application of NWBing status and DME recs. Pt making good progress toward goals, will continue to follow POC. Discharge recommendation remains appropriate.        If plan is discharge home, recommend the following:  A lot of help with walking and/or transfers;A lot of help with bathing/dressing/bathroom;Help with stairs or ramp for entrance   Equipment Recommendations  Wheelchair (measurements OT);BSC/3in1    Recommendations for Other Services      Precautions / Restrictions Precautions Precautions: Fall Recall of Precautions/Restrictions: Intact Required Braces or Orthoses: Knee Immobilizer - Left Knee Immobilizer - Left: On at all times Restrictions Weight Bearing Restrictions Per Provider Order: Yes LLE Weight Bearing Per Provider Order: Non weight bearing       Mobility Bed Mobility Overal bed mobility: Needs Assistance Bed Mobility: Supine to Sit, Sit to Supine     Supine to sit: Min assist Sit to supine: Min assist        Transfers Overall transfer level: Needs assistance Equipment used: Rolling walker (2 wheels) Transfers: Bed to chair/wheelchair/BSC, Sit to/from Stand Sit to Stand: Contact guard assist Stand pivot transfers: Contact guard assist   Step pivot transfers: Min  assist, +2 safety/equipment     General transfer comment: 1 posterior LOB with hopping requiring MOD A to correct, +2 to ensure maintaining NWBing pcns     Balance Overall balance assessment: Needs assistance Sitting-balance support: Feet supported Sitting balance-Leahy Scale: Fair     Standing balance support: Bilateral upper extremity supported Standing balance-Leahy Scale: Fair                             ADL either performed or assessed with clinical judgement   ADL Overall ADL's : Needs assistance/impaired                                       General ADL Comments: CGA + RW for w/c transfers.     Communication Communication Communication: No apparent difficulties   Cognition Arousal: Alert Behavior During Therapy: WFL for tasks assessed/performed Cognition: No apparent impairments                               Following commands: Impaired Following commands impaired: Follows multi-step commands inconsistently      Cueing   Cueing Techniques: Verbal cues             Pertinent Vitals/ Pain       Pain Assessment Pain Assessment: 0-10 Pain Score: 7  Pain Location: L knee Pain Descriptors / Indicators: Guarding, Grimacing, Discomfort Pain Intervention(s): Patient requesting pain meds-RN notified, RN gave pain meds during session,  Repositioned   Frequency  Min 3X/week        Progress Toward Goals  OT Goals(current goals can now be found in the care plan section)  Progress towards OT goals: Progressing toward goals  Acute Rehab OT Goals OT Goal Formulation: With patient Time For Goal Achievement: 10/15/23 Potential to Achieve Goals: Good ADL Goals Pt Will Perform Grooming: with modified independence;standing Pt Will Perform Lower Body Dressing: with modified independence;sitting/lateral leans Pt Will Transfer to Toilet: with modified independence;ambulating;regular height toilet  Plan       Co-evaluation                 AM-PAC OT 6 Clicks Daily Activity     Outcome Measure   Help from another person eating meals?: None Help from another person taking care of personal grooming?: A Little Help from another person toileting, which includes using toliet, bedpan, or urinal?: A Lot Help from another person bathing (including washing, rinsing, drying)?: A Lot Help from another person to put on and taking off regular upper body clothing?: None Help from another person to put on and taking off regular lower body clothing?: A Lot 6 Click Score: 17    End of Session Equipment Utilized During Treatment: Rolling walker (2 wheels);Gait belt  OT Visit Diagnosis: Other abnormalities of gait and mobility (R26.89);Muscle weakness (generalized) (M62.81)   Activity Tolerance Patient tolerated treatment well   Patient Left in bed;with call bell/phone within reach;with bed alarm set   Nurse Communication Patient requests pain meds        Time: 8484-8452 OT Time Calculation (min): 32 min  Charges: OT General Charges $OT Visit: 1 Visit OT Treatments $Self Care/Home Management : 8-22 mins $Therapeutic Activity: 8-22 mins  Elston Slot, M.S. OTR/L  10/01/23, 4:32 PM  ascom [phone]

## 2023-10-01 NOTE — Progress Notes (Signed)
 Full consult note to follow. Should be NWB on LLE in knee immobilizer. May mobilize with PT/OT.

## 2023-10-02 ENCOUNTER — Other Ambulatory Visit: Payer: Self-pay

## 2023-10-02 MED ORDER — HYDROCODONE-ACETAMINOPHEN 5-325 MG PO TABS
1.0000 | ORAL_TABLET | Freq: Four times a day (QID) | ORAL | 0 refills | Status: DC | PRN
  Filled 2023-10-02: qty 30, 4d supply, fill #0

## 2023-10-02 MED ORDER — HYDROCODONE-ACETAMINOPHEN 5-325 MG PO TABS: 1.0000 | ORAL_TABLET | Freq: Four times a day (QID) | ORAL | Status: DC | PRN

## 2023-10-02 MED ORDER — KETOROLAC TROMETHAMINE 10 MG PO TABS
10.0000 mg | ORAL_TABLET | Freq: Three times a day (TID) | ORAL | 0 refills | Status: AC
  Filled 2023-10-02: qty 15, 5d supply, fill #0

## 2023-10-02 NOTE — Progress Notes (Addendum)
 Physical Therapy Treatment Patient Details Name: Mason Russell MRN: [id number] DOB: 07-25-1985 Today's Date: 10/02/2023   History of Present Illness Mason Russell is a 38 y.o. male with history of scoliosis, gout, bilateral femoral head necrosis who presents emergency department after a fall at work. 09/30/23 L knee CT: subtle lateral tibial plateau fracture. Plan to treat nonoperatively in KI, NWB.    PT Comments  Pt A&Ox4, agreeable to participate in PT tx, cited L knee pain of 8/10 intensity at start of session- RN premedicated pt for session. Pt was met supine in bed, supervision for bed mobility with use of bed features, VC for hooking LLE with RLE for ease of movement. Pt completed squat-pivot transfer bed <> WC with CGA, VC for WC positioning to his R side and proper hand placement. Pt educated on use/management of WC parts (brakes, leg rests, arm rests) and showed good carryover with demo, minA VC for leg rest application. Pt able to propel WC ~244ft with supervision, able to navigate tight turns with minimal cueing. Pt was provided education and handout for navigating stairs with WC, pt receptive to all education in session. Pt was left seated on EOB at end of session- RN informed, all needs in reach. The patient would benefit from further skilled PT intervention to continue to progress towards goals.      If plan is discharge home, recommend the following: A lot of help with walking and/or transfers;A lot of help with bathing/dressing/bathroom;Assistance with cooking/housework;Assist for transportation;Help with stairs or ramp for entrance   Can travel by private vehicle     Yes  Equipment Recommendations  Wheelchair (measurements PT);BSC/3in1    Recommendations for Other Services       Precautions / Restrictions Precautions Precautions: Fall Recall of Precautions/Restrictions: Intact Precaution/Restrictions Comments: NWB LLE w/ knee imm Required Braces or Orthoses: Knee  Immobilizer - Left Knee Immobilizer - Left: On at all times Restrictions Weight Bearing Restrictions Per Provider Order: Yes LLE Weight Bearing Per Provider Order: Non weight bearing     Mobility  Bed Mobility Overal bed mobility: Needs Assistance Bed Mobility: Supine to Sit     Supine to sit: Supervision, HOB elevated, Used rails     General bed mobility comments: no physical assistance, increased time/effort and use of bed features. VC to hook LLE with RLE to assist with movement of LLE    Transfers Overall transfer level: Needs assistance Equipment used: None Transfers: Bed to chair/wheelchair/BSC       Squat pivot transfers: Contact guard assist     General transfer comment: pt performed transfer bed <> WC with extensive VC and education on squat pivot technique and proper hand placement    Ambulation/Gait                   Psychologist, counselling mobility: Yes Wheelchair propulsion: Both upper extremities Wheelchair parts: Supervision/cueing Distance: 210ft Wheelchair Assistance Details (indicate cue type and reason): pt able to navigate tight turns with minimal cueing   Tilt Bed    Modified Rankin (Stroke Patients Only)       Balance Overall balance assessment: Needs assistance Sitting-balance support: Feet supported Sitting balance-Leahy Scale: Good Sitting balance - Comments: steady sitting at EOB without UE support  Communication Communication Communication: No apparent difficulties  Cognition Arousal: Alert Behavior During Therapy: WFL for tasks assessed/performed   PT - Cognitive impairments: No apparent impairments                       PT - Cognition Comments: A&Ox4 Following commands: Impaired Following commands impaired: Follows multi-step commands inconsistently    Cueing Cueing Techniques: Verbal cues, Visual  cues, Tactile cues  Exercises Other Exercises Other Exercises: pt provided education and handout for 1+1 stair navigation with WC. Pt educated on WC parts and able to manage them with good carryover    General Comments        Pertinent Vitals/Pain Pain Assessment Pain Assessment: 0-10 Pain Score: 8  Pain Location: L knee Pain Descriptors / Indicators: Guarding, Grimacing, Discomfort Pain Intervention(s): Monitored during session, Repositioned    Home Living                          Prior Function            PT Goals (current goals can now be found in the care plan section)      Frequency    7X/week      PT Plan      Co-evaluation              AM-PAC PT 6 Clicks Mobility   Outcome Measure  Help needed turning from your back to your side while in a flat bed without using bedrails?: A Little Help needed moving from lying on your back to sitting on the side of a flat bed without using bedrails?: A Little Help needed moving to and from a bed to a chair (including a wheelchair)?: A Little Help needed standing up from a chair using your arms (e.g., wheelchair or bedside chair)?: A Little Help needed to walk in hospital room?: A Lot Help needed climbing 3-5 steps with a railing? : A Lot 6 Click Score: 16    End of Session Equipment Utilized During Treatment: Gait belt;Left knee immobilizer Activity Tolerance: Patient limited by pain Patient left: in bed;with call bell/phone within reach;with bed alarm set (sitting EOB, RN informed) Nurse Communication: Mobility status PT Visit Diagnosis: Unsteadiness on feet (R26.81);Other abnormalities of gait and mobility (R26.89);Muscle weakness (generalized) (M62.81);History of falling (Z91.81);Difficulty in walking, not elsewhere classified (R26.2);Pain Pain - Right/Left: Left Pain - part of body: Knee     Time: 9094-9059 PT Time Calculation (min) (ACUTE ONLY): 35 min  Charges:  1 ACUTE VISIT 8-22 minutes  $Therapeutic activity 8-22 minutes $WC management                          Philip Eckersley, SPT

## 2023-10-02 NOTE — Plan of Care (Signed)

## 2023-10-02 NOTE — TOC Transition Note (Signed)
 Transition of Care [facility]) - Discharge Note   Patient Details  Name: Jasmeet Gehl MRN: [id number] Date of Birth: 12/12/1985  [facility] CM/SW Contact:  [place] L Reizy Dunlow, LCSW Phone Number: 10/02/2023, 2:48 PM   Clinical Narrative:     Patient medically ready for discharge. Leopoldo was expected to provide [facility] to patient. This request was denied. Patient was provided documentation to assist him with applying for workman's comp. Patient may or may not have a PCP. This information is unclear as he reported conflicting information. Resources were added to the AVS.   Outpatient referral made to [facility] for PT. DME was provided to patient and delivered to the room.   No further TOC needs identified. TOC signing off.     Barriers to Discharge: Inadequate or no insurance   Patient Goals and CMS Choice            Discharge Placement                       Discharge Plan and Services Additional resources added to the After Visit Summary for                                       Social Drivers of Health (SDOH) Interventions SDOH Screenings   Food Insecurity: No Food Insecurity (10/01/2023)  Housing: Unknown (10/01/2023)  Transportation Needs: No Transportation Needs (10/01/2023)  Utilities: Not At Risk (10/01/2023)  Tobacco Use: Medium Risk (09/30/2023)     Readmission Risk Interventions     No data to display

## 2023-10-02 NOTE — Discharge Instructions (Addendum)
 [email or web address]- PLEASE CONTACT THIS CLINIC TO ESTABLISH A PRIMARY CARE PROVIDER.   You have been referred to outpatient physical and occupational therapy at [facility]. You will be contacted to schedule an appointment, or you can call and schedule.   [facility]  [address]  [place], [place] [address] [phone]  [phone] (fax)

## 2023-10-02 NOTE — Discharge Summary (Signed)
 Physician Discharge Summary   Mason Russell  male DOB: Apr 02, 1985  FMW:[id number]  PCP: System, Provider Not In  Admit date: 09/30/2023 Discharge date: 10/02/2023  Admitted From: home Disposition:  home with DME CODE STATUS: Full code  Discharge Instructions     Discharge instructions   Complete by: As directed    Non-weightbearing on the left leg.  Should remain in a knee immobilizer.  Follow-up with orthopedics in approximately 1-2 weeks as an outpatient for repeat x-rays. [facility] Course:  For full details, please see H&P, progress notes, consult notes and ancillary notes.  Briefly,  Mason Russell is a 38 y.o. male with medical history significant of scoliosis, bilateral femoral head necrosis who presented to the emergency department after a fall at work.    Patient initially reported pain across his pelvis and left knee. CT in the ED revealed nondisplaced lateral tibial plateau fracture.  Orthopedic surgery was consulted.    Left nondisplaced lateral tibial plateau fracture - Fracture is nondisplaced.  Orthopedic surgery consulted and recommending nonoperative management - Continue nonweightbearing on the left lower extremity and remain in knee immobilizer.  Provided wheelchair and BSC.   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 - Patient intermittently uses crutches at home when ambulating long distance   BMI 28 Overweight - Outpatient follow up for lifestyle modification and risk factor management   Hyperglycemia Pre-diabetic --A1c 5.8   Unless noted above, medications under STOP list are ones pt was not taking PTA.  Discharge Diagnoses:  Principal Problem:   Closed fracture of left tibial plateau Active Problems:   Tibial fracture   30 Day Unplanned Readmission Risk Score    Flowsheet Row ED to Hosp-Admission (Current) from  09/30/2023 in [facility] (1A)  30 Day Unplanned Readmission Risk Score (%) 8.45 Filed at 10/01/2023 0801    This score is the patient's risk of an unplanned readmission within 30 days of being discharged (0 -100%). The score is based on dignosis, age, lab data, medications, orders, and past utilization.   Low:  0-14.9   Medium: 15-21.9   High: 22-29.9   Extreme: 30 and above         Discharge Instructions:  Allergies as of 10/02/2023   No Known Allergies      Medication List     STOP taking these medications    allopurinol  300 MG tablet Commonly known as: ZYLOPRIM    atorvastatin 40 MG tablet Commonly known as: LIPITOR       TAKE these medications    acetaminophen  500 MG tablet Commonly known as: TYLENOL  Take 500 mg by mouth every 6 (six) hours as needed for mild pain.   HYDROcodone -acetaminophen  5-325 MG tablet Commonly known as: NORCO/VICODIN Take 1-2 tablets by mouth every 6 (six) hours as needed for moderate pain (pain score 4-6) or severe pain (pain score 7-10).   ketorolac  10 MG tablet Commonly known as: TORADOL  Take 1 tablet (10 mg total) by mouth every 8 (eight) hours for 5 days.               Durable Medical Equipment  (From admission, onward)           Start     Ordered   10/01/23 1407  For home use only DME standard manual wheelchair with seat cushion  Once  Comments: Patient suffers from tibial plateau fracture which impairs their ability to perform daily activities like bathing, dressing, and toileting in the home.  A crutch or walker will not resolve issue with performing activities of daily living. A wheelchair will allow patient to safely perform daily activities. Patient can safely propel the wheelchair in the home or has a caregiver who can provide assistance. Length of need 6 months . Accessories: elevating leg rests (ELRs), wheel locks, extensions and anti-tippers.   10/01/23 1407   10/01/23 1406   For home use only DME Bedside commode  Once       Question:  Patient needs a bedside commode to treat with the following condition  Answer:  Decreased ambulation status   10/01/23 1407             Follow-up Information     Tobie Priest, MD Follow up in 10 day(s).   Specialty: Orthopedic Surgery Contact information: [address] [place] [address] [phone]                 No Known Allergies   The results of significant diagnostics from this hospitalization (including imaging, microbiology, ancillary and laboratory) are listed below for reference.   Consultations:   Procedures/Studies: CT Knee Left Wo Contrast Result Date: 09/30/2023 CLINICAL DATA:  Knee trauma EXAM: CT OF THE LEFT KNEE WITHOUT CONTRAST TECHNIQUE: Multidetector CT imaging of the left knee was performed according to the standard protocol. Multiplanar CT image reconstructions were also generated. RADIATION DOSE REDUCTION: This exam was performed according to the departmental dose-optimization program which includes automated exposure control, adjustment of the mA and/or kV according to patient size and/or use of iterative reconstruction technique. COMPARISON:  Left knee x-ray same day FINDINGS: Bones/Joint/Cartilage There is a subtle lateral tibial plateau fracture without significant depression. Bones are diffusely osteopenic. There is no dislocation. Joint spaces are maintained. There is large lipohemarthrosis present. Ligaments Suboptimally assessed by CT. Muscles and Tendons No acute abnormality. Soft tissues No acute abnormality. IMPRESSION: 1. Subtle lateral tibial plateau fracture without significant depression. 2. Large lipohemarthrosis. Electronically Signed   By: Greig Pique M.D.   On: 09/30/2023 21:51   CT PELVIS WO CONTRAST Result Date: 09/30/2023 CLINICAL DATA:  Hip trauma with fracture suspected. X-ray done. Patient fell, landing on the buttocks. Chronic knee pain. Now with left  hip pain. Dizziness after the fall. EXAM: CT PELVIS WITHOUT CONTRAST TECHNIQUE: Multidetector CT imaging of the pelvis was performed following the standard protocol without intravenous contrast. RADIATION DOSE REDUCTION: This exam was performed according to the departmental dose-optimization program which includes automated exposure control, adjustment of the mA and/or kV according to patient size and/or use of iterative reconstruction technique. COMPARISON:  Left hip radiographs 09/30/2023. CT abdomen pelvis 08/03/2022 FINDINGS: Urinary Tract:  No abnormality visualized. Bowel: Unremarkable visualized pelvic bowel loops. Appendix is normal. Vascular/Lymphatic: No pathologically enlarged lymph nodes. No significant vascular abnormality seen. Reproductive:  No mass or other significant abnormality Other:  None. Musculoskeletal: Visualized lower lumbar spine, sacrum, pelvis, and hips are intact. No evidence of acute fracture or dislocation. No focal bone lesion or bone destruction. Visualized pelvic musculature is symmetrical. No intramuscular mass or hematoma. IMPRESSION: 1. No acute bony abnormalities. No evidence of acute fracture or dislocation of the left hip. 2. Soft tissue pelvis appears intact. No acute abnormalities are demonstrated. Electronically Signed   By: Elsie Gravely M.D.   On: 09/30/2023 21:45   DG Hip Unilat W or Wo Pelvis  2-3 Views Left Result Date: 09/30/2023 CLINICAL DATA:  fall EXAM: DG HIP (WITH OR WITHOUT PELVIS) 2-3V LEFT COMPARISON:  09/28/2021 FINDINGS: No evidence of pelvic fracture or diastasis.No acute hip fracture or dislocation.There is no evidence of arthropathy or other focal bone abnormality. Soft tissues are unremarkable. IMPRESSION: No acute fracture, pelvic bone diastasis, or dislocation. Electronically Signed   By: Rogelia Myers M.D.   On: 09/30/2023 17:57   DG Knee Complete 4 Views Left Result Date: 09/30/2023 CLINICAL DATA:  fall, decreased range of motion EXAM:  LEFT KNEE - COMPLETE 4+ VIEW COMPARISON:  June 24, 2021 FINDINGS: No acute fracture or dislocation. Small to moderate volume joint effusion. There is no evidence of arthropathy or other focal bone abnormality. Soft tissues are unremarkable. IMPRESSION: Small to moderate volume joint effusion. No acute fracture or dislocation. Electronically Signed   By: Rogelia Myers M.D.   On: 09/30/2023 17:55      Labs: BNP (last 3 results) No results for input(s): BNP in the last 8760 hours. Basic Metabolic Panel: Recent Labs  Lab 09/30/23 2255 10/01/23 0317  NA 141 138  K 3.5 3.6  CL 105 100  CO2 22 25  GLUCOSE 123* 130*  BUN 6 8  CREATININE 0.79 0.87  CALCIUM 8.9 8.7*   Liver Function Tests: No results for input(s): AST, ALT, ALKPHOS, BILITOT, PROT, ALBUMIN in the last 168 hours. No results for input(s): LIPASE, AMYLASE in the last 168 hours. No results for input(s): AMMONIA in the last 168 hours. CBC: Recent Labs  Lab 09/30/23 2255 10/01/23 0317  WBC 10.9* 10.4  NEUTROABS 8.5*  --   HGB 14.4 14.4  HCT 43.7 44.5  MCV 94.0 95.3  PLT 283 269   Cardiac Enzymes: No results for input(s): CKTOTAL, CKMB, CKMBINDEX, TROPONINI in the last 168 hours. BNP: Invalid input(s): POCBNP CBG: No results for input(s): GLUCAP in the last 168 hours. D-Dimer No results for input(s): DDIMER in the last 72 hours. Hgb A1c Recent Labs    10/01/23 0317  HGBA1C 5.8*   Lipid Profile No results for input(s): CHOL, HDL, LDLCALC, TRIG, CHOLHDL, LDLDIRECT in the last 72 hours. Thyroid function studies No results for input(s): TSH, T4TOTAL, T3FREE, THYROIDAB in the last 72 hours.  Invalid input(s): FREET3 Anemia work up No results for input(s): VITAMINB12, FOLATE, FERRITIN, TIBC, IRON, RETICCTPCT in the last 72 hours. Urinalysis    Component Value Date/Time   COLORURINE YELLOW (A) 08/03/2022 1010   APPEARANCEUR TURBID (A)  08/03/2022 1010   LABSPEC 1.016 08/03/2022 1010   PHURINE 5.0 08/03/2022 1010   GLUCOSEU NEGATIVE 08/03/2022 1010   HGBUR MODERATE (A) 08/03/2022 1010   BILIRUBINUR NEGATIVE 08/03/2022 1010   KETONESUR NEGATIVE 08/03/2022 1010   PROTEINUR >=300 (A) 08/03/2022 1010   NITRITE NEGATIVE 08/03/2022 1010   LEUKOCYTESUR LARGE (A) 08/03/2022 1010   Sepsis Labs Recent Labs  Lab 09/30/23 2255 10/01/23 0317  WBC 10.9* 10.4   Microbiology No results found for this or any previous visit (from the past 240 hours).   Total time spend on discharging this patient, including the last patient exam, discussing the hospital stay, instructions for ongoing care as it relates to all pertinent caregivers, as well as preparing the medical discharge records, prescriptions, and/or referrals as applicable, is 45 minutes.    Ellouise Haber, MD  [facility] 10/02/2023, 9:14 AM

## 2023-10-02 NOTE — Progress Notes (Signed)
     [facility] REFERRAL        Occupational Therapy * Physical Therapy * Speech Therapy                           DATE: 10/02/2023  PATIENT NAME: Mason Russell  PATIENT MRN: [id number]        DIAGNOSIS/DIAGNOSIS CODE: SKIPPER: 2.8  DATE OF DISCHARGE: 10/02/2023       PRIMARY CARE PHYSICIAN    N/A  PCP PHONE/FAX      Dear Provider (Name: [facility] __  Fax: [phone]   I certify that I have examined this patient and that occupational/physical/speech therapy is necessary on an outpatient basis.    The patient has expressed interest in completing their recommended course of therapy at your  location.  Once a formal order from the patient's primary care physician has been obtained, please  contact him/her to schedule an appointment for evaluation at your earliest convenience.   [ X]  Physical Therapy Evaluate and Treat  [  X]  Occupational Therapy Evaluate and Treat  [  ]  Speech Therapy Evaluate and Treat         The patient's primary care physician (listed above) must furnish and be responsible for a formal order such that the recommended services may be furnished while under the primary physician's care, and that the plan of care will be established and reviewed every 30 days (or more often if condition necessitates).

## 2023-10-02 NOTE — Plan of Care (Signed)
  Problem: Education: Goal: Knowledge of General Education information will improve Description: Including pain rating scale, medication(s)/side effects and non-pharmacologic comfort measures 10/02/2023 1809 by Joshua Andrez PARAS, LPN Outcome: Adequate for Discharge 10/02/2023 1809 by Joshua Andrez PARAS, LPN Outcome: Adequate for Discharge 10/02/2023 0740 by Joshua Andrez PARAS, LPN Outcome: Progressing   Problem: Health Behavior/Discharge Planning: Goal: Ability to manage health-related needs will improve 10/02/2023 1809 by Joshua Andrez PARAS, LPN Outcome: Adequate for Discharge 10/02/2023 1809 by Joshua Andrez PARAS, LPN Outcome: Adequate for Discharge 10/02/2023 0740 by Joshua Andrez PARAS, LPN Outcome: Progressing   Problem: Clinical Measurements: Goal: Ability to maintain clinical measurements within normal limits will improve 10/02/2023 1809 by Joshua Andrez PARAS, LPN Outcome: Adequate for Discharge 10/02/2023 1809 by Joshua Andrez PARAS, LPN Outcome: Adequate for Discharge Goal: Will remain free from infection 10/02/2023 1809 by Joshua Andrez PARAS, LPN Outcome: Adequate for Discharge 10/02/2023 1809 by Joshua Andrez PARAS, LPN Outcome: Adequate for Discharge 10/02/2023 0740 by Joshua Andrez PARAS, LPN Outcome: Progressing Goal: Diagnostic test results will improve 10/02/2023 1809 by Joshua Andrez PARAS, LPN Outcome: Adequate for Discharge 10/02/2023 1809 by Joshua Andrez PARAS, LPN Outcome: Adequate for Discharge 10/02/2023 0740 by Joshua Andrez PARAS, LPN Outcome: Progressing Goal: Respiratory complications will improve 10/02/2023 1809 by Joshua Andrez PARAS, LPN Outcome: Adequate for Discharge 10/02/2023 1809 by Joshua Andrez PARAS, LPN Outcome: Adequate for Discharge 10/02/2023 0740 by Joshua Andrez PARAS, LPN Outcome: Progressing Goal: Cardiovascular complication will be avoided 10/02/2023 1809 by Joshua Andrez PARAS, LPN Outcome: Adequate for Discharge 10/02/2023 1809 by Joshua Andrez PARAS, LPN Outcome: Adequate for Discharge   Problem: Activity: Goal: Risk  for activity intolerance will decrease 10/02/2023 1809 by Joshua Andrez PARAS, LPN Outcome: Adequate for Discharge 10/02/2023 1809 by Joshua Andrez PARAS, LPN Outcome: Adequate for Discharge   Problem: Nutrition: Goal: Adequate nutrition will be maintained 10/02/2023 1809 by Joshua Andrez PARAS, LPN Outcome: Adequate for Discharge 10/02/2023 1809 by Joshua Andrez PARAS, LPN Outcome: Adequate for Discharge   Problem: Coping: Goal: Level of anxiety will decrease 10/02/2023 1809 by Joshua Andrez PARAS, LPN Outcome: Adequate for Discharge 10/02/2023 1809 by Joshua Andrez PARAS, LPN Outcome: Adequate for Discharge   Problem: Elimination: Goal: Will not experience complications related to bowel motility 10/02/2023 1809 by Joshua Andrez PARAS, LPN Outcome: Adequate for Discharge 10/02/2023 1809 by Joshua Andrez PARAS, LPN Outcome: Adequate for Discharge Goal: Will not experience complications related to urinary retention 10/02/2023 1809 by Joshua Andrez PARAS, LPN Outcome: Adequate for Discharge 10/02/2023 1809 by Joshua Andrez PARAS, LPN Outcome: Adequate for Discharge   Problem: Pain Managment: Goal: General experience of comfort will improve and/or be controlled 10/02/2023 1809 by Joshua Andrez PARAS, LPN Outcome: Adequate for Discharge 10/02/2023 1809 by Joshua Andrez PARAS, LPN Outcome: Adequate for Discharge   Problem: Safety: Goal: Ability to remain free from injury will improve 10/02/2023 1809 by Joshua Andrez PARAS, LPN Outcome: Adequate for Discharge 10/02/2023 1809 by Joshua Andrez PARAS, LPN Outcome: Adequate for Discharge   Problem: Skin Integrity: Goal: Risk for impaired skin integrity will decrease 10/02/2023 1809 by Joshua Andrez PARAS, LPN Outcome: Adequate for Discharge 10/02/2023 1809 by Joshua Andrez PARAS, LPN Outcome: Adequate for Discharge

## 2023-10-02 NOTE — TOC Progression Note (Addendum)
 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 [facility]. Patient has a workman's comp issue because he was hurt on the job.   Medical team notified and advised that another plan is needed for patient. DME has already been delivered to the room.     Barriers to Discharge: Inadequate or no insurance               Expected Discharge Plan and Services         Expected Discharge Date: 10/02/23                                     Social Drivers of Health (SDOH) Interventions SDOH Screenings   Food Insecurity: No Food Insecurity (10/01/2023)  Housing: Unknown (10/01/2023)  Transportation Needs: No Transportation Needs (10/01/2023)  Utilities: Not At Risk (10/01/2023)  Tobacco Use: Medium Risk (09/30/2023)    Readmission Risk Interventions     No data to display

## 2024-01-24 ENCOUNTER — Ambulatory Visit

## 2024-01-24 ENCOUNTER — Encounter: Payer: Self-pay | Admitting: Emergency Medicine

## 2024-01-24 ENCOUNTER — Ambulatory Visit
Admission: EM | Admit: 2024-01-24 | Discharge: 2024-01-24 | Disposition: A | Discharge status: Home / Self Care | Attending: Physician Assistant | Admitting: Physician Assistant

## 2024-01-24 DIAGNOSIS — Z8739 Personal history of other diseases of the musculoskeletal system and connective tissue: Secondary | ICD-10-CM

## 2024-01-24 DIAGNOSIS — M25521 Pain in right elbow: Secondary | ICD-10-CM | POA: Diagnosis not present

## 2024-01-24 DIAGNOSIS — M65221 Calcific tendinitis, right upper arm: Secondary | ICD-10-CM

## 2024-01-24 MED ORDER — PREDNISONE 10 MG PO TABS: ORAL_TABLET | ORAL | 0 refills | Status: AC

## 2024-01-24 NOTE — ED Triage Notes (Signed)
 Patient c/o pain in his right elbow and forearm since Tuesday.  Patient states that no injury to his arm.  Patient states that he broke his left knee back in September.  Patient states that he was seen in the ED for that injury.  Patient has been using crutches to get around since September.

## 2024-01-24 NOTE — Discharge Instructions (Addendum)
-  You have tendinitis and bone spurs -Start prednisone .  -May continue tylenol  -Ice area  -Follow up with orthopedics

## 2024-01-24 NOTE — ED Provider Notes (Signed)
 " [facility]    CSN: [id number] Arrival date & time: 01/24/24  1425      History   Chief Complaint Chief Complaint  Patient presents with   Arm Pain    Right forearm    HPI Mason Russell is a 39 y.o. right hand dominant male with history of gout/inflammatory arthritis and fracture of tibial plateau in September 2025.  Patient presents today for evaluation of right elbow pain and swelling x 3 days.  Most of the pain is of the distal humerus.  Increased pain when trying to fully extend elbow which she finds difficult.  Patient also has pain when flexing elbow.  He says he has to hold his arm at 90 degree angle and roll onto the opposite side at night to sleep.  He has been taking Tylenol  without relief.  Of note, patient does have history of tibial plateau fracture in September 2025.  The last time he followed up with orthopedics close November 2025.  He says he is supposed to call them to follow-up on labs.  He continues to have pain in his leg and is using crutches.  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 Authorizing Provider  predniSONE  (DELTASONE ) 10 MG tablet Take 6 tabs po on day 1 and decease by 1 tab daily until complete 01/24/24  Yes Arvis Jolan NOVAK, PA-C  acetaminophen  (TYLENOL ) 500 MG tablet Take 500 mg by mouth every 6 (six) hours as needed for mild pain.    [provider]    Family History Family History  Problem Relation Age of Onset   Cancer Mother     Social History Social History[1]   Allergies   Patient has no known allergies.   Review of Systems Review of Systems  Musculoskeletal:  Positive for arthralgias and joint swelling.  Skin:  Negative for color change and wound.  Neurological:  Negative for weakness and numbness.     Physical Exam Triage Vital Signs ED Triage Vitals  Encounter Vitals Group     BP 01/24/24 1448 (!) 135/90     Girls Systolic BP Percentile --      Girls Diastolic BP Percentile --      Boys Systolic BP Percentile --      Boys Diastolic  BP Percentile --      Pulse Rate 01/24/24 1448 (!) 104     Resp 01/24/24 1448 16     Temp 01/24/24 1448 98.8 F (37.1 C)     Temp Source 01/24/24 1448 Oral     SpO2 01/24/24 1448 96 %     Weight 01/24/24 1446 173 lb 4.5 oz (78.6 kg)     Height 01/24/24 1446 5' 5 (1.651 m)     Head Circumference --      Peak Flow --      Pain Score 01/24/24 1446 5     Pain Loc --      Pain Education --      Exclude from Growth Chart --    No data found.  Updated Vital Signs BP (!) 135/90 (BP Location: Left Arm)   Pulse (!) 104   Temp 98.8 F (37.1 C) (Oral)   Resp 16   Ht 5' 5 (1.651 m)   Wt 173 lb 4.5 oz (78.6 kg)   SpO2 96%   BMI 28.84 kg/m     Physical Exam Vitals and nursing note reviewed.  Constitutional:      General: He is not in acute distress.    Appearance:  Normal appearance. He is well-developed. He is not ill-appearing.  HENT:     Head: Normocephalic and atraumatic.  Eyes:     Conjunctiva/sclera: Conjunctivae normal.  Cardiovascular:     Rate and Rhythm: Regular rhythm. Tachycardia present.     Pulses: Normal pulses.  Pulmonary:     Effort: Pulmonary effort is normal. No respiratory distress.     Breath sounds: Normal breath sounds.  Musculoskeletal:     Right elbow: Swelling present. No deformity. Decreased range of motion. Tenderness (proximal ulna, distal humerus) present.     Cervical back: Neck supple.  Skin:    General: Skin is warm and dry.     Capillary Refill: Capillary refill takes less than 2 seconds.  Neurological:     General: No focal deficit present.     Mental Status: He is alert. Mental status is at baseline.     Motor: No weakness.     Gait: Gait abnormal (using crutches).  Psychiatric:        Mood and Affect: Mood normal.        Behavior: Behavior normal.      UC Treatments / Results  Labs (all labs ordered are listed, but only abnormal results are displayed) Labs Reviewed - No data to display  EKG   Radiology DG Elbow Complete Right Result Date: 01/24/2024 CLINICAL DATA:  Right elbow pain.  No injury. EXAM: RIGHT ELBOW - COMPLETE 3+ VIEW COMPARISON:  None Available. FINDINGS: Right elbow is located without acute fracture. However, there is some lucency posterior to the distal humerus which could represent a small posterior fat pad sign. Findings are concerning for a small joint effusion. Enthesopathic changes at the olecranon. Difficult to exclude mild soft tissue swelling in the proximal forearm region. IMPRESSION: 1. No acute bone abnormality to the right elbow. 2. Findings are concerning for a small joint effusion. 3. Enthesopathic changes at the olecranon. Electronically Signed   By: Juliene Balder M.D.   On: 01/24/2024 15:39    Procedures Procedures (including critical care time)  Medications Ordered  in UC Medications - No data to display  Initial Impression / Assessment and Plan / UC Course  I have reviewed the triage vital signs and the nursing notes.  Pertinent labs & imaging results that were available during my care of the patient were reviewed by me and considered in my medical decision making (see chart for details).   39 y/o male with history of gout presents for atraumatic right elbow pain x 3 days.   X-ray of right elbow obtained.  X-ray shows bone spurs, small joint effusion and enthesopathic changes at the olecranon.  I reviewed all results with patient.  Patient declined Toradol  injection.  Will treat patient calcific tendinitis of the right elbow with prednisone  taper.  Also advised continuing Tylenol , ice, elevation.  Sling given as needed for comfort.  Advised avoiding painful activities and the work note was provided.  Patient follows up with Dr. Tobie and I encouraged him to make an appointment to follow-up on discomfort in his left leg after tibial plateau fracture a few months ago.  Patient to follow-up.   Final Clinical Impressions(s) / UC Diagnoses   Final diagnoses:  Right elbow pain  Calcific tendinitis of right elbow  History of gout     Discharge Instructions      -You have tendinitis and bone spurs -Start prednisone .  -May continue tylenol  -Ice area  -Follow up with orthopedics     ED Prescriptions     Medication Sig Dispense Auth. Provider   predniSONE  (DELTASONE ) 10 MG tablet Take 6 tabs po on day 1 and decease by 1 tab daily until complete 21 tablet Arvis Jolan NOVAK, PA-C      I have reviewed the PDMP during this encounter.     [1]  Social History Tobacco Use   Smoking status: Former    Types: Cigarettes   Smokeless tobacco: Never  Vaping Use   Vaping status: Never Used  Substance Use Topics   Alcohol use: Yes    Comment: occasionally   Drug use: Yes    Types: Marijuana     Arvis Jolan NOVAK, PA-C 01/24/24 1614  "
